# Patient Record
Sex: Male | Born: 1945
Health system: Southern US, Community
[De-identification: ages and names within clinical notes are randomized; demographics above are authoritative.]

## PROBLEM LIST (undated history)

## (undated) DIAGNOSIS — L853 Xerosis cutis: Secondary | ICD-10-CM

## (undated) DIAGNOSIS — N183 Chronic kidney disease, stage 3 unspecified: Secondary | ICD-10-CM

## (undated) DIAGNOSIS — E785 Hyperlipidemia, unspecified: Secondary | ICD-10-CM

## (undated) DIAGNOSIS — I10 Essential (primary) hypertension: Secondary | ICD-10-CM

## (undated) DIAGNOSIS — K8689 Other specified diseases of pancreas: Principal | ICD-10-CM

## (undated) DIAGNOSIS — R6889 Other general symptoms and signs: Secondary | ICD-10-CM

## (undated) DIAGNOSIS — E119 Type 2 diabetes mellitus without complications: Secondary | ICD-10-CM

## (undated) HISTORY — PX: NASAL SINUS SURGERY: SHX719

## (undated) HISTORY — DX: Hyperlipidemia, unspecified: E78.5

## (undated) HISTORY — PX: EYE SURGERY: SHX253

## (undated) HISTORY — DX: Essential (primary) hypertension: I10

## (undated) HISTORY — PX: CATARACT EXTRACTION W/ INTRAOCULAR LENS IMPLANT: SHX1309

## (undated) HISTORY — PX: OTHER SURGICAL HISTORY: SHX169

## (undated) HISTORY — DX: Type 2 diabetes mellitus without complications: E11.9

## (undated) HISTORY — DX: Other general symptoms and signs: R68.89

## (undated) HISTORY — PX: COLONOSCOPY: SHX174

## (undated) HISTORY — DX: Other specified diseases of pancreas: K86.89

---

## 1998-09-09 ENCOUNTER — Emergency Department (HOSPITAL_COMMUNITY): Admission: EM | Admit: 1998-09-09 | Discharge: 1998-09-09 | Payer: Self-pay | Admitting: Emergency Medicine

## 1998-09-11 ENCOUNTER — Emergency Department (HOSPITAL_COMMUNITY): Admission: EM | Admit: 1998-09-11 | Discharge: 1998-09-11 | Payer: Self-pay | Admitting: Emergency Medicine

## 1998-12-09 ENCOUNTER — Emergency Department (HOSPITAL_COMMUNITY): Admission: EM | Admit: 1998-12-09 | Discharge: 1998-12-09 | Payer: Self-pay | Admitting: Emergency Medicine

## 1998-12-14 HISTORY — PX: SHOULDER SURGERY: SHX246

## 1999-08-18 ENCOUNTER — Emergency Department (HOSPITAL_COMMUNITY): Admission: EM | Admit: 1999-08-18 | Discharge: 1999-08-18 | Payer: Self-pay | Admitting: Emergency Medicine

## 1999-08-18 ENCOUNTER — Encounter: Payer: Self-pay | Admitting: Emergency Medicine

## 2000-08-13 ENCOUNTER — Ambulatory Visit (HOSPITAL_COMMUNITY): Admission: RE | Admit: 2000-08-13 | Discharge: 2000-08-13 | Payer: Self-pay | Admitting: Internal Medicine

## 2001-06-01 ENCOUNTER — Encounter: Payer: Self-pay | Admitting: Internal Medicine

## 2001-06-01 ENCOUNTER — Encounter: Admission: RE | Admit: 2001-06-01 | Discharge: 2001-06-01 | Payer: Self-pay | Admitting: Internal Medicine

## 2001-07-14 ENCOUNTER — Encounter: Payer: Self-pay | Admitting: Family Medicine

## 2001-07-14 ENCOUNTER — Encounter: Admission: RE | Admit: 2001-07-14 | Discharge: 2001-07-14 | Payer: Self-pay | Admitting: Family Medicine

## 2001-08-08 ENCOUNTER — Encounter: Admission: RE | Admit: 2001-08-08 | Discharge: 2001-11-06 | Payer: Self-pay | Admitting: Internal Medicine

## 2001-11-03 ENCOUNTER — Encounter: Payer: Self-pay | Admitting: Anesthesiology

## 2001-11-09 ENCOUNTER — Ambulatory Visit (HOSPITAL_COMMUNITY): Admission: RE | Admit: 2001-11-09 | Discharge: 2001-11-09 | Payer: Self-pay | Admitting: Orthopedic Surgery

## 2002-04-20 ENCOUNTER — Encounter: Admission: RE | Admit: 2002-04-20 | Discharge: 2002-07-19 | Payer: Self-pay | Admitting: Internal Medicine

## 2004-03-20 ENCOUNTER — Ambulatory Visit (HOSPITAL_COMMUNITY): Admission: RE | Admit: 2004-03-20 | Discharge: 2004-03-20 | Payer: Self-pay | Admitting: Internal Medicine

## 2008-08-07 ENCOUNTER — Ambulatory Visit: Payer: Self-pay | Admitting: Internal Medicine

## 2010-10-22 DIAGNOSIS — E663 Overweight: Secondary | ICD-10-CM | POA: Insufficient documentation

## 2010-10-22 DIAGNOSIS — I2510 Atherosclerotic heart disease of native coronary artery without angina pectoris: Secondary | ICD-10-CM | POA: Insufficient documentation

## 2010-10-22 DIAGNOSIS — I10 Essential (primary) hypertension: Secondary | ICD-10-CM | POA: Insufficient documentation

## 2010-10-22 DIAGNOSIS — E785 Hyperlipidemia, unspecified: Secondary | ICD-10-CM | POA: Insufficient documentation

## 2010-12-04 ENCOUNTER — Encounter: Payer: Self-pay | Admitting: Internal Medicine

## 2010-12-04 ENCOUNTER — Ambulatory Visit: Payer: Self-pay | Admitting: Internal Medicine

## 2011-01-15 NOTE — Assessment & Plan Note (Signed)
Summary: f2y per pt call/lg   Visit Type:  Follow-up Primary Provider:  Dr Erskine Speed  CC:  no complaints.  History of Present Illness: Maurice Perkins is a very pleasant 65 year old male with a history of hypertension, hyperlipidemia, diabetes, iccthyosis and obesity. He returns for 2 year f/u.   He does not have a hiistory of known coronary artery disease.  He has had several Myoviews done as a screening procedure, once in 2005 and once in 2007 with Dr. Tresa Endo at [facility] and both were negative.   He has been followed quite closely by Dr. Chilton Si for his hypertension and his hyperlipidemia.  Doing very well. [place] [facility] [place] [facility] [place]no incline). No CP or dyspnea. BP has been very well controlled. Lipids supposedly also look good. A bit frustrated about not losing weight. Hasn't gained any though. Wife says he snores some but no witnessed apnea.   Preventive Screening-Counseling & Management  Caffeine-Diet-Exercise     Does Patient Exercise: yes      Drug Use:  no.    Current Medications (verified): 1)  Nitrostat 0.4 Mg Subl (Nitroglycerin) .Marland Kitchen.. 1 Tablet Under Tongue At Onset of Chest Pain; You May Repeat Every 5 Minutes For Up To 3 Doses. 2)  Atenolol 50 Mg Tabs (Atenolol) .... Take 1/2 Tablet By Mouth Daily 3)  Lipitor 40 Mg Tabs (Atorvastatin Calcium) .... Take One Tablet By Mouth Daily. 4)  Ammonium Lactate 12 % Lotn (Ammonium Lactate) .... Once Daily 5)  Losartan Potassium-Hctz 50-12.5 Mg Tabs (Losartan Potassium-Hctz) .... Take 1 Tablet By Mouth Once A Day 6)  Glimepiride 4 Mg Tabs (Glimepiride) .... Take 1 Tablet By Mouth Two Times A Day 7)  Januvia 100 Mg Tabs (Sitagliptin Phosphate) .... Take 1 Tablet By Mouth Once A Day 8)  Tricor 145 Mg Tabs (Fenofibrate) .... Take 1 Tablet By Mouth Once A Day 9)  Lantus Solostar 100 Unit/ml Soln (Insulin Glargine) .... As Directed  Allergies (verified): 1)  ! Pcn  Past History:  Past Medical History: 1. Diabetes  Type 2 2. Hyperlipidemia 3. Hypertension 4. Obesity 5. Renal insufficiency 6. Icthyosis  Past Surgical History: none  Family History:  Father passed away at 57 due to heart and lung failure. Family History of Cancer:  Mother -- breast                                           father -- mesothelioma Family History of Coronary Artery Disease: mother ? of MI Family History of CVA or Stroke: Mother questionable Family History of Hypertension:  father Family History of Depression: brother  Social History: Full Time Married w/ 4 kids Tobacco Use - Former.  1973 Alcohol Use - no Regular Exercise - yes -- somewhat Drug Use - no Does Patient Exercise:  yes Drug Use:  no  Review of Systems       As per HPI and past medical history; otherwise all systems negative.   Vital Signs:  Patient profile:   65 year old male Height:      75 inches Weight:      236 pounds BMI:     29.60 Pulse rate:   65 / minute BP sitting:   124 / 68  (left arm) Cuff size:   regular  Vitals Entered By: Hardin Negus, RMA (December 04, 2010 10:56  AM)  Physical Exam  General:  Well appearing. no resp difficulty HEENT: normal Neck: supple. no JVD. Carotids 2+ bilat; no bruits. No lymphadenopathy or thryomegaly appreciated. Cor: PMI nondisplaced. Regular rate & rhythm. No rubs, murmur. +s4 Lungs: clear Abdomen: obese. soft, nontender, nondistended. Good bowel sounds. Extremities: no cyanosis, clubbing, rash, edema Neuro: alert & orientedx3, cranial nerves grossly intact. moves all 4 extremities w/o difficulty. affect pleasant + dry skin   Impression & Recommendations:  Problem # 1:  Screening for Ischemic Heart Disease. Doing well. No evidence of angina. Discussed possible ETT for screening but wants to defer until next year.   Problem # 2:  HYPERTENSION, UNSPECIFIED (ICD-401.9) Blood pressure well controlled. Continue current regimen. Follow with Dr. Chilton Si.  Other Orders: EKG w/  Interpretation (93000)  Patient Instructions: 1)  Your physician wants you to follow-up in:  1 year.  You will receive a reminder letter in the mail two months in advance. If you don't receive a letter, please call our office to schedule the follow-up appointment.  Prevention & Chronic Care Immunizations   Influenza vaccine: Not documented    Tetanus booster: Not documented    Pneumococcal vaccine: Not documented    H. zoster vaccine: Not documented  Colorectal Screening   Hemoccult: Not documented    Colonoscopy: Not documented  Other Screening   PSA: Not documented   Smoking status: quit  (10/22/2010)  Lipids   Total Cholesterol: Not documented   LDL: Not documented   LDL Direct: Not documented   HDL: Not documented   Triglycerides: Not documented    SGOT (AST): Not documented   SGPT (ALT): Not documented   Alkaline phosphatase: Not documented   Total bilirubin: Not documented  Hypertension   Last Blood Pressure: 124 / 68  (12/04/2010)   Serum creatinine: Not documented   Serum potassium Not documented  Self-Management Support :    Hypertension self-management support: Not documented    Lipid self-management support: Not documented

## 2011-04-28 NOTE — Assessment & Plan Note (Signed)
[facility]                            CARDIOLOGY OFFICE NOTE   NAME:Maurice Perkins Senior                    MRN:          [id number]  DATE:08/07/2008                            DOB:          01-Nov-1946    PRIMARY CARE PHYSICIAN/REFERRING PHYSICIAN:  Erskine Speed, MD   HISTORY OF PRESENT ILLNESS:  Maurice Perkins is a very pleasant 65 year old male  with a history of hypertension, hyperlipidemia, diabetes, and obesity.  He was previously followed by Dr. Daphene Jaeger at [facility] and is now transitioning his care over to here.   He denies any history of known coronary artery disease.  He has had  several Myoviews done as a screening procedure, once in 2005 and once in  2007, and both were negative.  He has been followed quite closely by Dr.  Chilton Perkins for his hypertension and his hyperlipidemia.  His blood pressure  has been well controlled.  His diabetes unfortunately has been a little  bit more difficult.  His last hemoglobin A1c was in November 2008 with a  value of 10.4.  Cholesterol at that time was total cholesterol 180,  triglycerides 229, HDL 39, and LDL of 97.   He remains active at work.  He goes to the gym 3 days a week and does 20  minutes of cardio as well as about half an hour of weights.  He denies  any chest pain or shortness of breath.  On the treadmill, he walks about  4 miles an hour.  He notes that his heart rate is limited.  Peak heart  rate is limited to about 110-112 beats a minute, likely due to his  atenolol.   On review of systems, he does have arthritis as well as diabetes.  He  denies any snoring.  He attributes this fact that he has a deviated  septum repaired 30 years ago.  Remainder review of systems is negative  except for HPI and problem list.  Erectile dysfunction.   PROBLEM LIST:  1. Diabetes x10 years.      a.     Most recent hemoglobin A1c of 10.4.      b.     Complicated by diabetic nephropathy  with a creatinine of       1.7.  2. Hypertension, well controlled.  3. Hyperlipidemia.  4. Obesity.  5. Chronic renal insufficiency.  6. Nuclear study in 2007 at [facility] reportedly      normal ejection fraction with no perfusion defects.   CURRENT MEDICATIONS:  1. Atenolol 25 b.i.d.  2. TriCor 145 a day.  3. Lipitor 40 a day.  4. Glimepiride 8 a day.  5. Diovan/hydrochlorothiazide 80/12.5 a day.  6. Januvia 100 a day.  7. Aspirin 81.  8. Flaxseed oil b.i.d.   ALLERGIES:  PENICILLIN.   SOCIAL HISTORY:  He is married.  He has 4 kids.  He works as  Nurse, adult at [employer].  He used to smoke, but quit in 1973.  He does not drink alcohol.   FAMILY HISTORY:  Mother is alive at  37.  Father passed away at 46 due to  heart and lung failure.  Sister is 2 and well.  Brother is 42 and well.  No family history of premature coronary artery disease.   PHYSICAL EXAMINATION:  GENERAL:  He is in no acute distress, ambulatory  around the clinic, without any respiratory distress.  VITAL SIGNS:  Blood pressure initially 112/68, and on manual recheck it  is 100/58, heart rate 51, and weight is 237.  HEENT:  Normal.  NECK:  Supple.  No JVD.  Carotids are 2+ bilaterally without bruits.  There is no lymphadenopathy or thyromegaly.  HEART:  PMI is nondisplaced.  He is bradycardic and regular.  No  murmurs, rubs, or gallops.  LUNGS:  Clear.  ABDOMEN:  Soft, nontender, and nondistended.  No hepatosplenomegaly, no  bruits, and no masses.  Good bowel sounds.  EXTREMITIES:  Warm with no cyanosis, clubbing, or edema.  No rash.  Good  pulses.  NEURO:  Alert and oriented x3.  Cranial nerves II through XII are  intact.  Moves all 4 extremities without difficulty.  Affect is  pleasant.   EKG shows sinus bradycardia at a rate of 51.  No ST-T wave  abnormalities.   ASSESSMENT AND PLAN:  1. Hypertension.  This is well controlled.  In fact, his blood      pressure is a  little bit on the lower side.  Given his bradycardia      and erectile dysfunction, I have told him that he can cut back his      atenolol to 25 mg once a day and may consider just stopping this      altogether if Dr. Chilton Perkins is agreeable to that.  If his blood      pressure does come back up, he can consider adding Norvasc.  2. Hyperlipidemia.  Given his diabetes, we would be as aggressive as      possible with his cholesterol and continue statin in hope to get      his LDL under 70.  3. Coronary artery disease screening.  He has had a recent Myoview in      2007, which was negative.  His functional capacity has not changed.      He has not had any other symptoms.  We will defer any further      testing at this point.  However, given his risk factors, we will      have a low threshold to re-test, should he develop any decrease in      his exercise tolerance or exertional symptoms.  4. Obesity.  I did agree with him about the need for weight loss and      improvements in his diet.  5. Diabetes.  As per Dr. Chilton Perkins.   DISPOSITION:  We will see him back in 1 year for followup.     Maurice Perkins. Bensimhon, MD  Electronically Signed    DRB/MedQ  DD: 08/07/2008  DT: 08/08/2008  Job #: [id number]   cc:   Maurice Perkins, M.D.

## 2011-05-01 NOTE — Op Note (Signed)
[facility]  Patient:    SENECA, HOBACK Visit Number: [id number] MRN: [id number]          Service Type: DSU Location: DAY Attending Physician:  Marlowe Kays Page Dictated by:   Illene Labrador. Aplington, M.D. Proc. Date: 11/09/01 Admit Date:  11/09/2001                             Operative Report  PREOPERATIVE DIAGNOSES:  1. Chronic impingement syndrome with partial rotator cuff tear.  2. Adhesive capsulitis, right shoulder.  POSTOPERATIVE DIAGNOSES:  1. Chronic impingement syndrome with partial rotator cuff tear.  2. Labral disruption.  3. No adhesive capsulitis.  OPERATION PERFORMED:  1. Examination right shoulder under anesthesia.  2. Right shoulder arthroscopy with shaving of underneath surface of     rotator cuff and glenoid labrum.  3. Arthroscopic subacromial decompression with shaving of the superior     surface of the rotator cuff.  SURGEON:  Illene Labrador. Aplington, M.D.  ASSISTANT:  Mr. Cherlynn June.  ANESTHESIA:  General.  PATHOLOGY AND JUSTIFICATION FOR PROCEDURE:  He has had pain and loss of motion in the right shoulder with an MRI demonstrating underneath and bursal surface partial tears of the rotator cuff and a full-thickness tear associated with some degenerative arthritis of the AC joint. Clinically he has no tenderness of the [facility] joint and he was examined immediately prior to surgery as well. Also on clinical exam when I saw him in the office, he had substantial loss of motion.  DESCRIPTION OF PROCEDURE:  Satisfactory general anesthesia, beach chair position on the Schlein frame. Before prepping the shoulder, I checked his range of motion and once he was asleep it was unrestricted so he did not have any true adhesive capsulitis and I think his loss of motion was due to pain. We then prepped the shoulder girdle with duraprep and draped it in a sterile field. The anatomy of the shoulder was marked out. The lateral and  posterior soft spot portals as well as the subacromial space were infiltrated with 0.5% Marcaine with adrenaline. Through a posterior soft spot portal, a blunt trocar was placed atraumatically into the glenohumeral joint which was inspected. He had a little bit of wispy fibrous tissue and synovitis in the joint. The labrum was substantially frayed but not detached. The long handle biceps and the humeral head looked normal. The underneath surface of the rotator cuff near its distal attachment was frayed as depicted on the MRI. I then advanced the scope between the subscapularis and the biceps tendon and used the switching stick and made and anterior incision over which I placed a metal cannula followed by a 4.2 shaver. I then debrided out the wispy synovial type tissue in the joint, shaved down the labrum until smooth and shaved the underneath surface of the rotator cuff. Fluid was then evacuated from the joint and I then redirected the scope in the subacromial area. Through a lateral portal stab wound, I introduced a blunt trocar followed by a 4.2 shaver and then performed partial subdeltoid bursectomy with some shaving of the rotator cuff. I subsequently had to remove a little additional soft tissue but the major portion was done at this time. I then introduced the arthrocare vaporizer and removed bone on the underneath surface of the acromion as well as the coracoacromial ligament working back medial to the [facility] joint. Once most of the soft tissue had been removed,  I then introduced another 4 mm bur through the lateral approach and began burring down the underneath surface of the acromion and AC joint. I alternated back and forth between the bur, the shaver and the arthrocare vaporizer until we had wide decompression with bleeding controlled with the arthrocare vaporizer. I then took the picture of the subacromial decompression with his arm to his side and abducted. The fluid was evacuated  from the subacromial space which I then reinjected along with the three portals with 0.5% Marcaine with adrenaline and then closed the three portals with interrupted 4-0 nylon mattress sutures. Betadine Adaptic dry sterile and a shoulder immobilizer were applied. The patient tolerated the procedure well and was taken to the recovery room in satisfactory condition with no known complications. Dictated by:   Illene Labrador. Aplington, M.D. Attending Physician:  Joaquin Courts DD:  11/09/01 TD:  11/09/01 Job: [id number]

## 2011-07-09 ENCOUNTER — Telehealth: Payer: Self-pay | Admitting: Internal Medicine

## 2011-07-09 NOTE — Telephone Encounter (Signed)
Dec 2011 discussed ETT but pt wanted to defer to this year, Maurice Perkins is it ok to sch?

## 2011-07-09 NOTE — Telephone Encounter (Signed)
Pt would like to be set up for stress test.

## 2011-07-13 NOTE — Telephone Encounter (Signed)
OK 

## 2011-07-20 ENCOUNTER — Other Ambulatory Visit: Payer: Self-pay | Admitting: *Deleted

## 2011-07-20 ENCOUNTER — Telehealth: Payer: Self-pay | Admitting: Internal Medicine

## 2011-07-20 DIAGNOSIS — I10 Essential (primary) hypertension: Secondary | ICD-10-CM

## 2011-07-20 NOTE — Telephone Encounter (Signed)
SPOKE WITH PT STATED WAS DUE FOR ETT  ORDER ENTERED AND TEST SCHEDULED FOR 08-07-11 AT 10:00 AM WITH SCOTT WEAVER PAC  SEE LAST OFFICE NOTE FROM DR Gala Romney .Maurice Perkins

## 2011-07-20 NOTE — Telephone Encounter (Signed)
Pt calling to schedule stress test, said dr bensimhon said to call, no order

## 2011-08-07 ENCOUNTER — Ambulatory Visit (INDEPENDENT_AMBULATORY_CARE_PROVIDER_SITE_OTHER): Payer: PRIVATE HEALTH INSURANCE | Admitting: Physician Assistant

## 2011-08-07 DIAGNOSIS — I10 Essential (primary) hypertension: Secondary | ICD-10-CM

## 2011-08-07 DIAGNOSIS — R9439 Abnormal result of other cardiovascular function study: Secondary | ICD-10-CM

## 2011-08-07 NOTE — Progress Notes (Signed)
Exercise Treadmill Test  Pre-Exercise Testing Evaluation Rhythm: normal sinus  Rate: 77   PR:  .22 QRS:  .08  QT:  .37 QTc: .42     Test  Exercise Tolerance Test Ordering MD: Arvilla Meres, MD  Interpreting MD:  Tereso Newcomer PA-C  Unique Test No: 1  Treadmill:  1  Indication for ETT: HTN  Contraindication to ETT: No   Stress Modality: exercise - treadmill  Cardiac Imaging Performed: non   Protocol: standard Bruce - maximal  Max BP: 165/64  Max MPHR (bpm):  153 85% MPR (bpm):  130  MPHR obtained (bpm):150 % MPHR obtained:  97  Reached 85% MPHR (min:sec):  5:01 Total Exercise Time (min-sec):  7:00  Workload in METS:  11.7 Borg Scale: 18  Reason ETT Terminated:  patient's desire to stop    ST Segment Analysis At Rest: normal ST segments - no evidence of significant ST depression With Exercise: borderline ST changes  Other Information Arrhythmia:  No Angina during ETT:  absent (0) Quality of ETT:  diagnostic  ETT Interpretation:  borderline (indeterminate) with non-specific ST changes  Comments: Good exercise tolerance. No chest pain. Normal BP response. Borderline ST changes laterally with exercise.  Recommendations: With h/o Diabetes, will arrange stress Myoview to r/o ischemic heart disease. Discussed with patient. Also d/w Dr. Gala Romney.

## 2011-08-07 NOTE — Patient Instructions (Signed)
Your physician has requested that you have an exercise tolerance test DX ABNORMAL STRESS TEST. For further information please visit [email or web address] Please also follow instruction sheet, as given.

## 2011-08-11 ENCOUNTER — Ambulatory Visit (HOSPITAL_COMMUNITY): Payer: Medicare Other | Attending: Internal Medicine | Admitting: Radiology

## 2011-08-11 VITALS — Ht 75.0 in | Wt 226.0 lb

## 2011-08-11 DIAGNOSIS — R9439 Abnormal result of other cardiovascular function study: Secondary | ICD-10-CM

## 2011-08-11 DIAGNOSIS — R0602 Shortness of breath: Secondary | ICD-10-CM

## 2011-08-11 DIAGNOSIS — R9431 Abnormal electrocardiogram [ECG] [EKG]: Secondary | ICD-10-CM

## 2011-08-11 MED ORDER — TECHNETIUM TC 99M TETROFOSMIN IV KIT
33.0000 | PACK | Freq: Once | INTRAVENOUS | Status: AC | PRN
  Administered 2011-08-11: 33 via INTRAVENOUS

## 2011-08-11 MED ORDER — TECHNETIUM TC 99M TETROFOSMIN IV KIT
11.0000 | PACK | Freq: Once | INTRAVENOUS | Status: AC | PRN
  Administered 2011-08-11: 11 via INTRAVENOUS

## 2011-08-11 NOTE — Progress Notes (Signed)
[facility] SITE 3 NUCLEAR MED [address] [place] [address] [phone]  Cardiology Nuclear Med Sarita Bottom  Maurice Perkins is a 65 y.o. male [id number] 1946/05/31   Nuclear Med Background Indication for Stress Test:  Evaluation for Ischemia History:  No previous documented CAD, 08/07/11 GXT; Abnormal and '07 Myocardial Perfusion Study; Dr.Kelly Nl Cardiac Risk Factors: Family History - CAD, History of Smoking, Hypertension, IDDM Type 2, Lipids and Obesity  Symptoms:  SOB   Nuclear Pre-Procedure Caffeine/Decaff Intake:  7:00pm NPO After: 8:00am   Lungs:  clear IV 0.9% NS with Angio Cath:  20g  IV Site: R Wrist  IV Started by:  Cathlyn Parsons, RN  Chest Size (in):  44 Cup Size: n/a  Height: 6\' 3"  (1.905 m)  Weight:  226 lb (102.513 kg)  BMI:  Body mass index is 28.25 kg/(m^2). Tech Comments:  N/A    Nuclear Med Study 1 or 2 day study: 1 day  Stress Test Type:  Stress  Reading MD: Cassell Clement, MD  Order Authorizing Provider:  D.Bensimhon  Resting Radionuclide: Technetium 24m Tetrofosmin  Resting Radionuclide Dose: 10.9 mCi   Stress Radionuclide:  Technetium 63m Tetrofosmin  Stress Radionuclide Dose: 33.0 mCi           Stress Protocol Rest HR: 62 Stress HR: 137  Rest BP: 96/69 Stress BP: 152/65  Exercise Time (min): 10:15 METS: 12.00   Predicted Max HR: 154 bpm % Max HR: 88.96 bpm Rate Pressure Product: 46962   Dose of Adenosine (mg):  n/a Dose of Lexiscan: n/a mg  Dose of Atropine (mg): n/a Dose of Dobutamine: n/a mcg/kg/min (at max HR)  Stress Test Technologist: Milana Na, EMT-P  Nuclear Technologist:  Harlow Asa, CNMT     Rest Procedure:  Myocardial perfusion imaging was performed at rest 45 minutes following the intravenous administration of Technetium 56m Tetrofosmin. Rest ECG: Sinus Bradycardia  Stress Procedure:  The patient exercised for 10:15.  The patient stopped due to sob, fatigue, and denied any chest pain.   There were no significant ST-T wave changes.  Technetium 1m Tetrofosmin was injected at peak exercise and myocardial perfusion imaging was performed after a brief delay. Stress ECG: No significant change from baseline ECG  QPS Raw Data Images:  Normal; no motion artifact; normal heart/lung ratio. Stress Images:  Normal homogeneous uptake in all areas of the myocardium. Rest Images:  Normal homogeneous uptake in all areas of the myocardium. Subtraction (SDS):  No evidence of ischemia. Transient Ischemic Dilatation (Normal <1.22):0.87 Lung/Heart Ratio (Normal <0.45):  0.45  Quantitative Gated Spect Images QGS EDV:  105 ml QGS ESV:  41 ml QGS cine images:  NL LV Function; NL Wall Motion QGS EF: 61%  Impression Exercise Capacity:  Good exercise capacity. BP Response:  Normal blood pressure response. Clinical Symptoms:  No chest pain. ECG Impression:  No significant ST segment change suggestive of ischemia. Comparison with Prior Nuclear Study: No images to compare  Overall Impression:  Normal stress nuclear study.    Cassell Clement

## 2011-08-12 ENCOUNTER — Telehealth: Payer: Self-pay | Admitting: *Deleted

## 2011-08-12 NOTE — Telephone Encounter (Signed)
pt aware of stress test results today. Danielle Rankin

## 2011-08-14 ENCOUNTER — Encounter: Payer: Self-pay | Admitting: *Deleted

## 2013-12-14 DIAGNOSIS — K8689 Other specified diseases of pancreas: Secondary | ICD-10-CM

## 2013-12-14 HISTORY — DX: Other specified diseases of pancreas: K86.89

## 2014-05-04 ENCOUNTER — Ambulatory Visit (HOSPITAL_COMMUNITY)
Admission: RE | Admit: 2014-05-04 | Discharge: 2014-05-04 | Disposition: A | Discharge status: Home / Self Care | Payer: Medicare HMO | Source: Ambulatory Visit | Attending: Internal Medicine | Admitting: Internal Medicine

## 2014-05-04 ENCOUNTER — Other Ambulatory Visit: Payer: Self-pay | Admitting: Internal Medicine

## 2014-05-04 ENCOUNTER — Other Ambulatory Visit (HOSPITAL_COMMUNITY): Payer: Self-pay | Admitting: Internal Medicine

## 2014-05-04 ENCOUNTER — Encounter (HOSPITAL_COMMUNITY): Payer: Self-pay

## 2014-05-04 DIAGNOSIS — K56609 Unspecified intestinal obstruction, unspecified as to partial versus complete obstruction: Secondary | ICD-10-CM

## 2014-05-04 DIAGNOSIS — K824 Cholesterolosis of gallbladder: Secondary | ICD-10-CM | POA: Insufficient documentation

## 2014-05-04 DIAGNOSIS — R109 Unspecified abdominal pain: Secondary | ICD-10-CM | POA: Insufficient documentation

## 2014-05-04 DIAGNOSIS — K828 Other specified diseases of gallbladder: Secondary | ICD-10-CM

## 2014-05-04 LAB — CREATININE, SERUM
CREATININE: 1.47 mg/dL — AB (ref 0.50–1.35)
GFR calc Af Amer: 54 mL/min — ABNORMAL LOW (ref >90)
GFR calc non Af Amer: 47 mL/min — ABNORMAL LOW (ref >90)

## 2014-05-04 MED ORDER — IOHEXOL 300 MG/ML  SOLN
80.0000 mL | Freq: Once | INTRAMUSCULAR | Status: AC | PRN
  Administered 2014-05-04: 80 mL via INTRAVENOUS

## 2014-05-08 ENCOUNTER — Telehealth: Payer: Self-pay

## 2014-05-08 NOTE — Telephone Encounter (Signed)
Message copied by Algernon Huxley on Tue May 08, 2014  9:19 AM ------      Message from: Erskine Emery D      Created: Tue May 08, 2014  8:18 AM       Dr. Rolly Salter office will be calling to schedule this pt for an OV.  Please make time this week. ------

## 2014-05-08 NOTE — Telephone Encounter (Signed)
Pt scheduled to see Dr. Deatra Ina tomorrow at 2:30pm. Spoke with pts wife and she is aware of appt date and time.

## 2014-05-09 ENCOUNTER — Encounter: Payer: Self-pay | Admitting: Gastroenterology

## 2014-05-09 ENCOUNTER — Other Ambulatory Visit (INDEPENDENT_AMBULATORY_CARE_PROVIDER_SITE_OTHER): Payer: Medicare HMO

## 2014-05-09 ENCOUNTER — Ambulatory Visit (INDEPENDENT_AMBULATORY_CARE_PROVIDER_SITE_OTHER): Payer: Medicare HMO | Admitting: Gastroenterology

## 2014-05-09 VITALS — BP 90/62 | HR 80 | Ht 75.0 in | Wt 224.4 lb

## 2014-05-09 DIAGNOSIS — K8689 Other specified diseases of pancreas: Secondary | ICD-10-CM

## 2014-05-09 DIAGNOSIS — K869 Disease of pancreas, unspecified: Secondary | ICD-10-CM

## 2014-05-09 DIAGNOSIS — Z1211 Encounter for screening for malignant neoplasm of colon: Secondary | ICD-10-CM | POA: Insufficient documentation

## 2014-05-09 LAB — COMPREHENSIVE METABOLIC PANEL
ALBUMIN: 3.9 g/dL (ref 3.5–5.2)
ALK PHOS: 44 U/L (ref 39–117)
ALT: 22 U/L (ref 0–53)
AST: 21 U/L (ref 0–37)
BUN: 31 mg/dL — ABNORMAL HIGH (ref 6–23)
CO2: 29 mEq/L (ref 19–32)
Calcium: 9.5 mg/dL (ref 8.4–10.5)
Chloride: 100 mEq/L (ref 96–112)
Creatinine, Ser: 2 mg/dL — ABNORMAL HIGH (ref 0.4–1.5)
GFR: 35.92 mL/min — ABNORMAL LOW (ref >60.00)
Glucose, Bld: 229 mg/dL — ABNORMAL HIGH (ref 70–99)
POTASSIUM: 4 meq/L (ref 3.5–5.1)
Sodium: 140 mEq/L (ref 135–145)
Total Bilirubin: 0.7 mg/dL (ref 0.2–1.2)
Total Protein: 7 g/dL (ref 6.0–8.3)

## 2014-05-09 LAB — AMYLASE: Amylase: 91 U/L (ref 27–131)

## 2014-05-09 LAB — CBC WITH DIFFERENTIAL/PLATELET
BASOS ABS: 0 10*3/uL (ref 0.0–0.1)
BASOS PCT: 0.4 % (ref 0.0–3.0)
EOS ABS: 0.2 10*3/uL (ref 0.0–0.7)
Eosinophils Relative: 1.8 % (ref 0.0–5.0)
HCT: 45 % (ref 39.0–52.0)
Hemoglobin: 15.1 g/dL (ref 13.0–17.0)
Lymphocytes Relative: 14.5 % (ref 12.0–46.0)
Lymphs Abs: 1.3 10*3/uL (ref 0.7–4.0)
MCHC: 33.5 g/dL (ref 30.0–36.0)
MCV: 88 fl (ref 78.0–100.0)
MONO ABS: 0.8 10*3/uL (ref 0.1–1.0)
Monocytes Relative: 9.3 % (ref 3.0–12.0)
NEUTROS PCT: 74 % (ref 43.0–77.0)
Neutro Abs: 6.7 10*3/uL (ref 1.4–7.7)
Platelets: 284 10*3/uL (ref 150.0–400.0)
RBC: 5.12 Mil/uL (ref 4.22–5.81)
RDW: 14.1 % (ref 11.5–15.5)
WBC: 9 10*3/uL (ref 4.0–10.5)

## 2014-05-09 LAB — PROTIME-INR
INR: 1.1 ratio — ABNORMAL HIGH (ref 0.8–1.0)
Prothrombin Time: 12.4 s (ref 9.6–13.1)

## 2014-05-09 LAB — LIPASE: Lipase: 58 U/L (ref 11.0–59.0)

## 2014-05-09 NOTE — Assessment & Plan Note (Signed)
Last colonoscopy approximately 9 years ago.  Pending general medical status would consider followup colonoscopy in one year

## 2014-05-09 NOTE — Assessment & Plan Note (Signed)
Pancreatic mass is highly suspicious for a neoplasm.  Inflammatory mass related to pancreatitis is less likely although a consideration.  Recommendations #1 check CA 19-9, LFTs, lipase and amylase #2 [place] for biopsy-this will be arranged within the next 7 days with Dr. Oretha Caprice

## 2014-05-09 NOTE — Patient Instructions (Signed)
Go to the basement for labs today  Dr Ardis Hughs nurse will be contacting you for a procedure for next week

## 2014-05-09 NOTE — Progress Notes (Signed)
_                                                                                                                History of Present Illness: Pleasant 68 year old white male with history of diabetes and hypertension referred at the request of Dr. Nyoka Cowden for evaluation of a pancreatic mass seen on recent CT scan.  He presented almost one week ago with moderate upper abdominal pain with radiation to the back.  This prompted ultrasound and CT, which I reviewed, that demonstrates a 3 cm mass in the uncinate region of the pancreas.  It appears to abut the superior mesenteric vein.  There is no ductal dilatation.  The patient's abdominal pain spontaneously resolved and he has had no recurrences.  Weight is stable.               _                                                                                                                History of Present Illness:    Past Medical History  Diagnosis Date  . Diabetes   . Hypertension    No past surgical history on file. family history is not on file. Current Outpatient Prescriptions  Medication Sig Dispense Refill  . atenolol (TENORMIN) 50 MG tablet Take 25 mg by mouth daily.      Marland Kitchen atorvastatin (LIPITOR) 40 MG tablet Take 40 mg by mouth daily.      . fenofibrate 160 MG tablet Take 160 mg by mouth daily.      Marland Kitchen glimepiride (AMARYL) 4 MG tablet Take 4 mg by mouth 2 (two) times daily.      . Insulin Glargine (LANTUS) 100 UNIT/ML Solostar Pen Inject 50 Units into the skin daily at 10 pm.      . losartan-hydrochlorothiazide (HYZAAR) 50-12.5 MG per tablet Take 1 tablet by mouth daily.      . metFORMIN (GLUCOPHAGE) 500 MG tablet Take 500 mg by mouth 4 (four) times daily.       No current facility-administered medications for this visit.   Allergies as of 05/09/2014 - Review Complete 05/09/2014  Allergen Reaction Noted  . Penicillins      reports that he has quit smoking. He has never used smokeless tobacco. He reports that  he does not drink alcohol or use illicit drugs.     Review of Systems: Pertinent positive and negative review of systems were noted in the above HPI section. All other review of systems were  otherwise negative.  Vital signs were reviewed in today's medical record Physical Exam: General: Well developed , well nourished, no acute distress Skin: anicteric Head: Normocephalic and atraumatic Eyes:  sclerae anicteric, EOMI Ears: Normal auditory acuity Mouth: No deformity or lesions Neck: Supple, no masses or thyromegaly Lungs: Clear throughout to auscultation Heart: Regular rate and rhythm; no murmurs, rubs or bruits Abdomen: Soft, non tender and non distended. No masses, hepatosplenomegaly or hernias noted. Normal Bowel sounds Rectal:deferred Musculoskeletal: Symmetrical with no gross deformities  Skin: No lesions on visible extremities Pulses:  Normal pulses noted Extremities: No clubbing, cyanosis, edema or deformities noted Neurological: Alert oriented x 4, grossly nonfocal Cervical Nodes:  No significant cervical adenopathy Inguinal Nodes: No significant inguinal adenopathy Psychological:  Alert and cooperative. Normal mood and affect  See Assessment and Plan under Problem List          Past Medical History  Diagnosis Date  . Diabetes   . Hypertension    No past surgical history on file. family history is not on file. Current Outpatient Prescriptions  Medication Sig Dispense Refill  . atenolol (TENORMIN) 50 MG tablet Take 25 mg by mouth daily.      Marland Kitchen atorvastatin (LIPITOR) 40 MG tablet Take 40 mg by mouth daily.      . fenofibrate 160 MG tablet Take 160 mg by mouth daily.      Marland Kitchen glimepiride (AMARYL) 4 MG tablet Take 4 mg by mouth 2 (two) times daily.      . Insulin Glargine (LANTUS) 100 UNIT/ML Solostar Pen Inject 50 Units into the skin daily at 10 pm.      . losartan-hydrochlorothiazide (HYZAAR) 50-12.5 MG per tablet Take 1 tablet by mouth daily.      . metFORMIN  (GLUCOPHAGE) 500 MG tablet Take 500 mg by mouth 4 (four) times daily.       No current facility-administered medications for this visit.   Allergies as of 05/09/2014 - Review Complete 05/09/2014  Allergen Reaction Noted  . Penicillins      reports that he has quit smoking. He has never used smokeless tobacco. He reports that he does not drink alcohol or use illicit drugs.     Review of Systems: Pertinent positive and negative review of systems were noted in the above HPI section. All other review of systems were otherwise negative.  Vital signs were reviewed in today's medical record Physical Exam: General: Well developed , well nourished, no acute distress Skin: anicteric Head: Normocephalic and atraumatic Eyes:  sclerae anicteric, EOMI Ears: Normal auditory acuity Mouth: No deformity or lesions Neck: Supple, no masses or thyromegaly Lungs: Clear throughout to auscultation Heart: Regular rate and rhythm; no murmurs, rubs or bruits Abdomen: Soft, non tender and non distended. No masses, hepatosplenomegaly or hernias noted. Normal Bowel sounds Rectal:deferred Musculoskeletal: Symmetrical with no gross deformities  Skin: No lesions on visible extremities Pulses:  Normal pulses noted Extremities: No clubbing, cyanosis, edema or deformities noted Neurological: Alert oriented x 4, grossly nonfocal Cervical Nodes:  No significant cervical adenopathy Inguinal Nodes: No significant inguinal adenopathy Psychological:  Alert and cooperative. Normal mood and affect  See Assessment and Plan under Problem List

## 2014-05-10 ENCOUNTER — Other Ambulatory Visit: Payer: Self-pay

## 2014-05-10 ENCOUNTER — Telehealth: Payer: Self-pay

## 2014-05-10 ENCOUNTER — Encounter (HOSPITAL_COMMUNITY): Payer: Self-pay | Admitting: Pharmacy Technician

## 2014-05-10 DIAGNOSIS — K8689 Other specified diseases of pancreas: Secondary | ICD-10-CM

## 2014-05-10 LAB — CANCER ANTIGEN 19-9: CA 19 9: 12.1 U/mL (ref <35.0)

## 2014-05-10 NOTE — Telephone Encounter (Signed)
EUS scheduled, pt instructed and medications reviewed.  Patient instructions mailed to home.  Patient to call with any questions or concerns.  

## 2014-05-10 NOTE — Telephone Encounter (Signed)
Message copied by Barron Alvine on Thu May 10, 2014  8:30 AM ------      Message from: Owens Loffler P      Created: Thu May 10, 2014  7:41 AM       Leroy Libman get him on next week's EUS schedule. Thanks            Trashaun Streight,      This is the man that needs upper EUS, radial +/- linear, pancreatic mass, +MAC.  Thanks                  ----- Message -----         From: Inda Castle, MD         Sent: 05/09/2014   3:49 PM           To: Milus Banister, MD                   ------

## 2014-05-11 ENCOUNTER — Encounter (HOSPITAL_COMMUNITY): Payer: Self-pay | Admitting: *Deleted

## 2014-05-17 ENCOUNTER — Encounter (HOSPITAL_COMMUNITY)
Admission: RE | Disposition: A | Discharge status: Home / Self Care | Payer: Self-pay | Source: Ambulatory Visit | Attending: Gastroenterology

## 2014-05-17 ENCOUNTER — Ambulatory Visit (HOSPITAL_COMMUNITY): Payer: Medicare HMO | Admitting: Anesthesiology

## 2014-05-17 ENCOUNTER — Encounter (HOSPITAL_COMMUNITY): Payer: Self-pay | Admitting: *Deleted

## 2014-05-17 ENCOUNTER — Ambulatory Visit (HOSPITAL_COMMUNITY)
Admission: RE | Admit: 2014-05-17 | Discharge: 2014-05-17 | Disposition: A | Discharge status: Home / Self Care | Payer: Medicare HMO | Source: Ambulatory Visit | Attending: Gastroenterology | Admitting: Gastroenterology

## 2014-05-17 ENCOUNTER — Encounter (HOSPITAL_COMMUNITY): Payer: Medicare HMO | Admitting: Anesthesiology

## 2014-05-17 DIAGNOSIS — Z880 Allergy status to penicillin: Secondary | ICD-10-CM | POA: Insufficient documentation

## 2014-05-17 DIAGNOSIS — Z794 Long term (current) use of insulin: Secondary | ICD-10-CM | POA: Insufficient documentation

## 2014-05-17 DIAGNOSIS — K8689 Other specified diseases of pancreas: Secondary | ICD-10-CM

## 2014-05-17 DIAGNOSIS — I2510 Atherosclerotic heart disease of native coronary artery without angina pectoris: Secondary | ICD-10-CM | POA: Insufficient documentation

## 2014-05-17 DIAGNOSIS — I10 Essential (primary) hypertension: Secondary | ICD-10-CM | POA: Insufficient documentation

## 2014-05-17 DIAGNOSIS — E119 Type 2 diabetes mellitus without complications: Secondary | ICD-10-CM | POA: Insufficient documentation

## 2014-05-17 DIAGNOSIS — K869 Disease of pancreas, unspecified: Secondary | ICD-10-CM

## 2014-05-17 DIAGNOSIS — Z79899 Other long term (current) drug therapy: Secondary | ICD-10-CM | POA: Insufficient documentation

## 2014-05-17 DIAGNOSIS — Z87891 Personal history of nicotine dependence: Secondary | ICD-10-CM | POA: Insufficient documentation

## 2014-05-17 HISTORY — DX: Xerosis cutis: L85.3

## 2014-05-17 HISTORY — PX: EUS: SHX5427

## 2014-05-17 LAB — GLUCOSE, CAPILLARY
Glucose-Capillary: 126 mg/dL — ABNORMAL HIGH (ref 70–99)
Glucose-Capillary: 109 mg/dL — ABNORMAL HIGH (ref 70–99)

## 2014-05-17 SURGERY — UPPER ENDOSCOPIC ULTRASOUND (EUS) LINEAR
Anesthesia: Monitor Anesthesia Care

## 2014-05-17 MED ORDER — PROPOFOL 10 MG/ML IV BOLUS
INTRAVENOUS | Status: AC
  Filled 2014-05-17: qty 20

## 2014-05-17 MED ORDER — CIPROFLOXACIN HCL 500 MG PO TABS: 500.0000 mg | ORAL_TABLET | Freq: Two times a day (BID) | ORAL | Status: DC

## 2014-05-17 MED ORDER — LIDOCAINE HCL (CARDIAC) 20 MG/ML IV SOLN
INTRAVENOUS | Status: AC
  Filled 2014-05-17: qty 5

## 2014-05-17 MED ORDER — PROPOFOL 10 MG/ML IV BOLUS
INTRAVENOUS | Status: DC | PRN
  Administered 2014-05-17: 30 mg via INTRAVENOUS
  Administered 2014-05-17: 20 mg via INTRAVENOUS
  Administered 2014-05-17: 150 mg via INTRAVENOUS

## 2014-05-17 MED ORDER — ONDANSETRON HCL 4 MG/2ML IJ SOLN
INTRAMUSCULAR | Status: DC | PRN
  Administered 2014-05-17: 4 mg via INTRAVENOUS

## 2014-05-17 MED ORDER — FENTANYL CITRATE 0.05 MG/ML IJ SOLN
INTRAMUSCULAR | Status: AC
  Filled 2014-05-17: qty 2

## 2014-05-17 MED ORDER — ONDANSETRON HCL 4 MG/2ML IJ SOLN
INTRAMUSCULAR | Status: AC
  Filled 2014-05-17: qty 2

## 2014-05-17 MED ORDER — CIPROFLOXACIN IN D5W 400 MG/200ML IV SOLN
INTRAVENOUS | Status: AC
  Filled 2014-05-17: qty 200

## 2014-05-17 MED ORDER — MIDAZOLAM HCL 2 MG/2ML IJ SOLN
INTRAMUSCULAR | Status: AC
  Filled 2014-05-17: qty 2

## 2014-05-17 MED ORDER — FENTANYL CITRATE 0.05 MG/ML IJ SOLN
INTRAMUSCULAR | Status: DC | PRN
  Administered 2014-05-17: 100 ug via INTRAVENOUS

## 2014-05-17 MED ORDER — SODIUM CHLORIDE 0.9 % IV SOLN: INTRAVENOUS | Status: DC

## 2014-05-17 MED ORDER — KETAMINE HCL 10 MG/ML IJ SOLN
INTRAMUSCULAR | Status: AC
  Filled 2014-05-17: qty 1

## 2014-05-17 MED ORDER — KETAMINE HCL 10 MG/ML IJ SOLN
INTRAMUSCULAR | Status: DC | PRN
  Administered 2014-05-17: 10 mg via INTRAVENOUS
  Administered 2014-05-17: 30 mg via INTRAVENOUS

## 2014-05-17 MED ORDER — FENTANYL CITRATE 0.05 MG/ML IJ SOLN: 25.0000 ug | INTRAMUSCULAR | Status: DC | PRN

## 2014-05-17 MED ORDER — PROPOFOL INFUSION 10 MG/ML OPTIME
INTRAVENOUS | Status: DC | PRN
  Administered 2014-05-17: 120 ug/kg/min via INTRAVENOUS

## 2014-05-17 MED ORDER — LIDOCAINE HCL (CARDIAC) 20 MG/ML IV SOLN
INTRAVENOUS | Status: DC | PRN
  Administered 2014-05-17: 100 mg via INTRAVENOUS

## 2014-05-17 MED ORDER — LACTATED RINGERS IV SOLN
INTRAVENOUS | Status: DC
  Administered 2014-05-17: 1000 mL via INTRAVENOUS

## 2014-05-17 MED ORDER — SUCCINYLCHOLINE CHLORIDE 20 MG/ML IJ SOLN
INTRAMUSCULAR | Status: DC | PRN
  Administered 2014-05-17: 100 mg via INTRAVENOUS

## 2014-05-17 MED ORDER — CIPROFLOXACIN IN D5W 400 MG/200ML IV SOLN
INTRAVENOUS | Status: DC | PRN
  Administered 2014-05-17: 400 mg via INTRAVENOUS

## 2014-05-17 MED ORDER — MIDAZOLAM HCL 5 MG/5ML IJ SOLN
INTRAMUSCULAR | Status: DC | PRN
  Administered 2014-05-17 (×2): 1 mg via INTRAVENOUS

## 2014-05-17 NOTE — Interval H&P Note (Signed)
History and Physical Interval Note:  05/17/2014 11:39 AM  Maurice Perkins  has presented today for surgery, with the diagnosis of Pancreatic mass [577.9]  The various methods of treatment have been discussed with the patient and family. After consideration of risks, benefits and other options for treatment, the patient has consented to  Procedure(s): UPPER ENDOSCOPIC ULTRASOUND (EUS) LINEAR (N/A) as a surgical intervention .  The patient's history has been reviewed, patient examined, no change in status, stable for surgery.  I have reviewed the patient's chart and labs.  Questions were answered to the patient's satisfaction.     Milus Banister

## 2014-05-17 NOTE — Anesthesia Preprocedure Evaluation (Addendum)
Anesthesia Evaluation  Patient identified by MRN, date of birth, ID band Patient awake    Reviewed: Allergy & Precautions, H&P , NPO status , Patient's Chart, lab work & pertinent test results, reviewed documented beta blocker date and time   Airway Mallampati: II TM Distance: >3 FB Neck ROM: full    Dental  (+) Caps, Dental Advisory Given Upper front are capped:   Pulmonary neg pulmonary ROS, former smoker,  breath sounds clear to auscultation  Pulmonary exam normal       Cardiovascular Exercise Tolerance: Good hypertension, Pt. on home beta blockers and Pt. on medications + CAD Rhythm:regular Rate:Normal     Neuro/Psych negative neurological ROS  negative psych ROS   GI/Hepatic negative GI ROS, Neg liver ROS,   Endo/Other  diabetes, Well Controlled, Type 2, Oral Hypoglycemic Agents, Insulin Dependent  Renal/GU Renal InsufficiencyRenal diseaseCRT 2.0  negative genitourinary   Musculoskeletal   Abdominal   Peds  Hematology negative hematology ROS (+)   Anesthesia Other Findings   Reproductive/Obstetrics negative OB ROS                          Anesthesia Physical Anesthesia Plan  ASA: III  Anesthesia Plan: MAC   Post-op Pain Management:    Induction:   Airway Management Planned:   Additional Equipment:   Intra-op Plan:   Post-operative Plan:   Informed Consent: I have reviewed the patients History and Physical, chart, labs and discussed the procedure including the risks, benefits and alternatives for the proposed anesthesia with the patient or authorized representative who has indicated his/her understanding and acceptance.   Dental Advisory Given  Plan Discussed with: CRNA and Surgeon  Anesthesia Plan Comments:         Anesthesia Quick Evaluation

## 2014-05-17 NOTE — Op Note (Signed)
[facility] [place], [address]   ENDOSCOPIC ULTRASOUND PROCEDURE REPORT  PATIENT: Maurice Perkins, Maurice Perkins  MR#: [id number] BIRTHDATE: 1946/06/01  GENDER: Male ENDOSCOPIST: Milus Banister, MD REFERRED BY:  Inda Castle, M.D. PROCEDURE DATE:  05/17/2014 PROCEDURE:   Upper EUS w/FNA ASA CLASS:      Class II INDICATIONS:   recent, transient abdominal pain; CT scan shows mass in uncinate pancreas.  Never history of pancreatic disease, no pancreatic disease in his family, no weight loss, CA 9-19 was normal. MEDICATIONS: General endotracheal anesthesia (GETA)  DESCRIPTION OF PROCEDURE:   After the risks benefits and alternatives of the procedure were  explained, informed consent was obtained. The patient was then placed in the left, lateral, decubitus postion and IV sedation was administered. Throughout the procedure, the patients blood pressure, pulse and oxygen saturations were monitored continuously.  Under direct visualization, the Pentax EUS Radial [id number]  endoscope was introduced through the mouth  and advanced to the second portion of the duodenum .  Water was used as necessary to provide an acoustic interface.  Upon completion of the imaging, water was removed and the patient was sent to the recovery room in satisfactory condition.  Endoscopic findings (limited views with radial and linear echoendoscopes): 1. Normal UGI tract  EUS findings: 1. 3.2cm cystic appearing mass in uncinate pancreas, directly abutting SMV/PV confluence. There were a few thin septations within the cyst. There were no associated solid masses and the cyst did not communicate with the pancreatic duct. The cyst was paritally aspirated.  There was blood tinged, mucinous fluid and it was sent to cytology. 2. The pancreatic parenchyma was otherwise normal 3. No peripancreatic adenopathy. 4. Main pancreatic duct was normal, non-dilated 5. Limited views of liver, spleen,  portal and splenic vessels were all normal.  Impression: The lesion in uncinate pancreas appears cystic, mucinous.  Doubt that this is malignant or pre-malignant.  Await final cytology results.  He will complete 3 day course of cipro twice daily.  I may recommend repeat imagin (with MRI) pending final cytology results.   _______________________________ eSignedMilus Banister, MD 05/17/2014 12:55 PM

## 2014-05-17 NOTE — Discharge Instructions (Signed)

## 2014-05-17 NOTE — Anesthesia Postprocedure Evaluation (Signed)
  Anesthesia Post-op Note  Patient: Maurice Perkins  Procedure(s) Performed: Procedure(s) (LRB): UPPER ENDOSCOPIC ULTRASOUND (EUS) LINEAR (N/A)  Patient Location: PACU  Anesthesia Type: General  Level of Consciousness: awake and alert   Airway and Oxygen Therapy: Patient Spontanous Breathing  Post-op Pain: mild  Post-op Assessment: Post-op Vital signs reviewed, Patient's Cardiovascular Status Stable, Respiratory Function Stable, Patent Airway and No signs of Nausea or vomiting  Last Vitals:  Filed Vitals:   05/17/14 1347  BP: 119/74  Pulse: 63  Temp:   Resp: 18    Post-op Vital Signs: stable   Complications: No apparent anesthesia complications

## 2014-05-17 NOTE — Transfer of Care (Signed)
Immediate Anesthesia Transfer of Care Note  Patient: Maurice Perkins  Procedure(s) Performed: Procedure(s): UPPER ENDOSCOPIC ULTRASOUND (EUS) LINEAR (N/A)  Patient Location: PACU and Endoscopy Unit  Anesthesia Type:General  Level of Consciousness: awake, sedated and responds to stimulation  Airway & Oxygen Therapy: Patient Spontanous Breathing and Patient connected to face mask oxygen  Post-op Assessment: Report given to PACU RN and Post -op Vital signs reviewed and stable  Post vital signs: Reviewed and stable  Complications: No apparent anesthesia complications

## 2014-05-17 NOTE — H&P (View-Only) (Signed)
_                                                                                                                History of Present Illness: Pleasant 68 year old white male with history of diabetes and hypertension referred at the request of Dr. Nyoka Cowden for evaluation of a pancreatic mass seen on recent CT scan.  He presented almost one week ago with moderate upper abdominal pain with radiation to the back.  This prompted ultrasound and CT, which I reviewed, that demonstrates a 3 cm mass in the uncinate region of the pancreas.  It appears to abut the superior mesenteric vein.  There is no ductal dilatation.  The patient's abdominal pain spontaneously resolved and he has had no recurrences.  Weight is stable.               _                                                                                                                History of Present Illness:    Past Medical History  Diagnosis Date  . Diabetes   . Hypertension    No past surgical history on file. family history is not on file. Current Outpatient Prescriptions  Medication Sig Dispense Refill  . atenolol (TENORMIN) 50 MG tablet Take 25 mg by mouth daily.      Marland Kitchen atorvastatin (LIPITOR) 40 MG tablet Take 40 mg by mouth daily.      . fenofibrate 160 MG tablet Take 160 mg by mouth daily.      Marland Kitchen glimepiride (AMARYL) 4 MG tablet Take 4 mg by mouth 2 (two) times daily.      . Insulin Glargine (LANTUS) 100 UNIT/ML Solostar Pen Inject 50 Units into the skin daily at 10 pm.      . losartan-hydrochlorothiazide (HYZAAR) 50-12.5 MG per tablet Take 1 tablet by mouth daily.      . metFORMIN (GLUCOPHAGE) 500 MG tablet Take 500 mg by mouth 4 (four) times daily.       No current facility-administered medications for this visit.   Allergies as of 05/09/2014 - Review Complete 05/09/2014  Allergen Reaction Noted  . Penicillins      reports that he has quit smoking. He has never used smokeless tobacco. He reports that  he does not drink alcohol or use illicit drugs.     Review of Systems: Pertinent positive and negative review of systems were noted in the above HPI section. All other review of systems were  otherwise negative.  Vital signs were reviewed in today's medical record Physical Exam: General: Well developed , well nourished, no acute distress Skin: anicteric Head: Normocephalic and atraumatic Eyes:  sclerae anicteric, EOMI Ears: Normal auditory acuity Mouth: No deformity or lesions Neck: Supple, no masses or thyromegaly Lungs: Clear throughout to auscultation Heart: Regular rate and rhythm; no murmurs, rubs or bruits Abdomen: Soft, non tender and non distended. No masses, hepatosplenomegaly or hernias noted. Normal Bowel sounds Rectal:deferred Musculoskeletal: Symmetrical with no gross deformities  Skin: No lesions on visible extremities Pulses:  Normal pulses noted Extremities: No clubbing, cyanosis, edema or deformities noted Neurological: Alert oriented x 4, grossly nonfocal Cervical Nodes:  No significant cervical adenopathy Inguinal Nodes: No significant inguinal adenopathy Psychological:  Alert and cooperative. Normal mood and affect  See Assessment and Plan under Problem List          Past Medical History  Diagnosis Date  . Diabetes   . Hypertension    No past surgical history on file. family history is not on file. Current Outpatient Prescriptions  Medication Sig Dispense Refill  . atenolol (TENORMIN) 50 MG tablet Take 25 mg by mouth daily.      Marland Kitchen atorvastatin (LIPITOR) 40 MG tablet Take 40 mg by mouth daily.      . fenofibrate 160 MG tablet Take 160 mg by mouth daily.      Marland Kitchen glimepiride (AMARYL) 4 MG tablet Take 4 mg by mouth 2 (two) times daily.      . Insulin Glargine (LANTUS) 100 UNIT/ML Solostar Pen Inject 50 Units into the skin daily at 10 pm.      . losartan-hydrochlorothiazide (HYZAAR) 50-12.5 MG per tablet Take 1 tablet by mouth daily.      . metFORMIN  (GLUCOPHAGE) 500 MG tablet Take 500 mg by mouth 4 (four) times daily.       No current facility-administered medications for this visit.   Allergies as of 05/09/2014 - Review Complete 05/09/2014  Allergen Reaction Noted  . Penicillins      reports that he has quit smoking. He has never used smokeless tobacco. He reports that he does not drink alcohol or use illicit drugs.     Review of Systems: Pertinent positive and negative review of systems were noted in the above HPI section. All other review of systems were otherwise negative.  Vital signs were reviewed in today's medical record Physical Exam: General: Well developed , well nourished, no acute distress Skin: anicteric Head: Normocephalic and atraumatic Eyes:  sclerae anicteric, EOMI Ears: Normal auditory acuity Mouth: No deformity or lesions Neck: Supple, no masses or thyromegaly Lungs: Clear throughout to auscultation Heart: Regular rate and rhythm; no murmurs, rubs or bruits Abdomen: Soft, non tender and non distended. No masses, hepatosplenomegaly or hernias noted. Normal Bowel sounds Rectal:deferred Musculoskeletal: Symmetrical with no gross deformities  Skin: No lesions on visible extremities Pulses:  Normal pulses noted Extremities: No clubbing, cyanosis, edema or deformities noted Neurological: Alert oriented x 4, grossly nonfocal Cervical Nodes:  No significant cervical adenopathy Inguinal Nodes: No significant inguinal adenopathy Psychological:  Alert and cooperative. Normal mood and affect  See Assessment and Plan under Problem List

## 2014-05-18 ENCOUNTER — Encounter (HOSPITAL_COMMUNITY): Payer: Self-pay | Admitting: Gastroenterology

## 2014-05-30 ENCOUNTER — Telehealth: Payer: Self-pay | Admitting: Gastroenterology

## 2014-05-30 NOTE — Telephone Encounter (Signed)
I left VM on his home phone, his wife's mobile phone (for him to call my office to discuss final recommendations)  His personal mobile phone does not accept messages.  I spoke with Dr. Barry Dienes about his pancreatic cystic lesion and she would like to meet with him to consider surgical resection.

## 2014-05-31 NOTE — Telephone Encounter (Signed)
I have also left several messages for the pt, I have left messages today as well.

## 2014-05-31 NOTE — Telephone Encounter (Signed)
Maurice Perkins, I have left VMs on his and his wife's phones, no response. Can you try to contact him and arrange a time, number later in afternoon today hopefully that I can contact him.  I need to discuss final recommendations (referral to surgery).

## 2014-05-31 NOTE — Telephone Encounter (Signed)
Dr Ardis Hughs the pt is available at [phone] this afternoon and is expecting your call.

## 2014-06-03 NOTE — Telephone Encounter (Signed)
I spoke with him last week.  Advised that I discussed case with Dr. Barry Dienes and we agree that given mucin in the cyst he should at least consider Surgical resection.  Patty, he needs referal to Dr. Barry Dienes to discuss potential surgery for the cystic lesion in pancreas.

## 2014-06-04 ENCOUNTER — Other Ambulatory Visit: Payer: Self-pay

## 2014-06-04 DIAGNOSIS — K862 Cyst of pancreas: Secondary | ICD-10-CM

## 2014-06-04 NOTE — Telephone Encounter (Signed)
Referral entered in epic for [facility]. Waiting to hear back regarding appt.

## 2014-06-04 NOTE — Telephone Encounter (Signed)
Maurice Perkins,Pt is scheduled for July 1st arrive at 9.30 for a 9.45 apt with Dr Stark Klein.Marland KitchenMarland KitchenThanks [employer] with pt and he is aware. States he is out of town and would like to move the appt a few days out. Pt given the phone number [phone] to move the appt to a date that works for him.

## 2014-06-06 ENCOUNTER — Other Ambulatory Visit: Payer: Self-pay

## 2014-06-06 ENCOUNTER — Telehealth: Payer: Self-pay

## 2014-06-06 DIAGNOSIS — K8689 Other specified diseases of pancreas: Secondary | ICD-10-CM

## 2014-06-06 NOTE — Telephone Encounter (Signed)
Message copied by Algernon Huxley on Wed Jun 06, 2014 12:08 PM ------      Message from: Erskine Emery D      Created: Wed Jun 06, 2014 10:50 AM       Please schedule an MRCP.  Explain to the patient that his case was discussed at GI tumor conference and Dr. Barry Dienes felt that this would be helpful information.  I discussed this with Dr. Nyoka Cowden. ------

## 2014-06-06 NOTE — Telephone Encounter (Signed)
Pt scheduled for MRCP at [facility] 06/19/14@8am . Pt to arrive there at 7:45am. Pt to be NPO after midnight. Left message for pt to call back.  06/07/14@11am -Left message for pt to call back.  Spoke with pt and he is aware of appt and instructions.

## 2014-06-13 ENCOUNTER — Ambulatory Visit (INDEPENDENT_AMBULATORY_CARE_PROVIDER_SITE_OTHER): Payer: Commercial Managed Care - HMO | Admitting: General Surgery

## 2014-06-18 ENCOUNTER — Telehealth: Payer: Self-pay | Admitting: Gastroenterology

## 2014-06-18 NOTE — Telephone Encounter (Signed)
Pt wanted to know why he is scheduled for [facility]. Discussed with pt that he is to be seen to consider having his pancreatic cyst resected and questions answered.

## 2014-06-19 ENCOUNTER — Ambulatory Visit (HOSPITAL_COMMUNITY)
Admission: RE | Admit: 2014-06-19 | Discharge: 2014-06-19 | Disposition: A | Discharge status: Home / Self Care | Payer: Medicare HMO | Source: Ambulatory Visit | Attending: Gastroenterology | Admitting: Gastroenterology

## 2014-06-19 ENCOUNTER — Other Ambulatory Visit: Payer: Self-pay | Admitting: Gastroenterology

## 2014-06-19 DIAGNOSIS — K8689 Other specified diseases of pancreas: Secondary | ICD-10-CM

## 2014-06-19 DIAGNOSIS — N281 Cyst of kidney, acquired: Secondary | ICD-10-CM | POA: Insufficient documentation

## 2014-06-19 DIAGNOSIS — K7689 Other specified diseases of liver: Secondary | ICD-10-CM | POA: Insufficient documentation

## 2014-06-19 DIAGNOSIS — K869 Disease of pancreas, unspecified: Secondary | ICD-10-CM | POA: Insufficient documentation

## 2014-06-19 DIAGNOSIS — D1803 Hemangioma of intra-abdominal structures: Secondary | ICD-10-CM | POA: Insufficient documentation

## 2014-06-19 LAB — POCT I-STAT CREATININE: Creatinine, Ser: 1.7 mg/dL — ABNORMAL HIGH (ref 0.50–1.35)

## 2014-06-19 MED ORDER — GADOBENATE DIMEGLUMINE 529 MG/ML IV SOLN
10.0000 mL | Freq: Once | INTRAVENOUS | Status: AC | PRN
  Administered 2014-06-19: 10 mL via INTRAVENOUS

## 2014-06-21 ENCOUNTER — Encounter (INDEPENDENT_AMBULATORY_CARE_PROVIDER_SITE_OTHER): Payer: Self-pay | Admitting: General Surgery

## 2014-06-21 ENCOUNTER — Ambulatory Visit (INDEPENDENT_AMBULATORY_CARE_PROVIDER_SITE_OTHER): Payer: Commercial Managed Care - HMO | Admitting: General Surgery

## 2014-06-21 VITALS — BP 112/78 | HR 74 | Temp 99.0°F | Resp 16 | Ht 75.0 in | Wt 226.4 lb

## 2014-06-21 DIAGNOSIS — K869 Disease of pancreas, unspecified: Secondary | ICD-10-CM

## 2014-06-21 DIAGNOSIS — K8689 Other specified diseases of pancreas: Secondary | ICD-10-CM

## 2014-06-21 NOTE — Progress Notes (Signed)
Chief Complaint  Patient presents with  . 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 Ardis Hughs to evaluate a cystic pancreatic lesion. He had an episode of severe mid abdominal pain that went through to his back. The pain persisted and worsened overnight. He saw his regular doctor who ordered an ultrasound to evaluate his gallbladder. The ultrasound was negative, so a CT scan was ordered. This also demonstrated a 3 cm cyst on his pancreas. Dr. Ardis Hughs was consulted and performed an endoscopic ultrasound. He had evidence of mucin in the mass. He had no cytologic atypia or dysplasia.  Cytology demonstrated cystic mucinous neoplasm. He has had no recurrence of these abdominal symptoms. He has been a diabetic for a while.  He denies any significant alcohol intake.  Liver function tests and pancreatic enzymes were normal during this episode.    Past Medical History  Diagnosis Date  . Diabetes   . Hypertension   . Dry skin     pt has skin oil deficiency ischtiosis  . Hyperlipidemia   . Unspecified disease of pancreas   . Other general symptoms(780.99)   . Pancreatic mass     Past Surgical History  Procedure Laterality Date  . Shoulder surgery Right 2000  . Nasal sinus surgery  1970's  . Eus N/A 05/17/2014    Procedure: UPPER ENDOSCOPIC ULTRASOUND (EUS) LINEAR;  Surgeon: Milus Banister, MD;  Location: [facility];  Service: Endoscopy;  Laterality: N/A;    Current Outpatient Prescriptions  Medication Sig Dispense Refill  . aspirin 81 MG tablet Take 81 mg by mouth daily.      Marland Kitchen atenolol (TENORMIN) 50 MG tablet Take 25 mg by mouth every morning.       Marland Kitchen atorvastatin (LIPITOR) 40 MG tablet Take 40 mg by mouth daily.      . ciprofloxacin (CIPRO) 500 MG tablet Take 1 tablet (500 mg total) by mouth 2 (two) times daily.  6 tablet  0  . fenofibrate 160 MG tablet Take 160 mg by mouth daily.      Marland Kitchen  FREESTYLE LITE test strip       . glimepiride (AMARYL) 4 MG tablet Take 4 mg by mouth 2 (two) times daily.      . Insulin Glargine (LANTUS) 100 UNIT/ML Solostar Pen Inject 50 Units into the skin every morning.       . insulin glulisine (APIDRA) 100 UNIT/ML injection Inject 3 Units into the skin 3 (three) times daily as needed. Sliding scale. Only uses if sugar is running high      . losartan-hydrochlorothiazide (HYZAAR) 50-12.5 MG per tablet Take 1 tablet by mouth every morning.       . metFORMIN (GLUCOPHAGE) 500 MG tablet Take 500 mg by mouth 4 (four) times daily.       . Misc Natural Products (GLUCOSAMINE CHONDROITIN MSM PO) Take 2 tablets by mouth daily.      . Multiple Vitamin (MULTIVITAMIN WITH MINERALS) TABS tablet Take 1 tablet by mouth daily.       No current facility-administered medications for this visit.     Allergies  Allergen Reactions  . Penicillins     REACTION: hives     Family History  Problem Relation Age of Onset  . Cancer Mother     breast  . Heart disease Mother      History   Social History  .  Marital Status: Married    Spouse Name: N/A    Number of Children: 4  . Years of Education: N/A   Occupational History  . Insurance account manager    Social History Main Topics  . Smoking status: Former Smoker -- 2.00 packs/day for 15 years    Types: Cigarettes    Quit date: 12/15/1971  . Smokeless tobacco: Never Used  . Alcohol Use: No  . Drug Use: No  . Sexual Activity: None   Other Topics Concern  . None   Social History Narrative  . None     REVIEW OF SYSTEMS - PERTINENT POSITIVES ONLY: 12 point review of systems negative other than HPI and PMH  EXAM: Filed Vitals:   06/21/14 1505  BP: 112/78  Pulse: 74  Temp: 99 F (37.2 C)  Resp: 16    Wt Readings from Last 3 Encounters:  06/21/14 226 lb 6.4 oz (102.694 kg)  05/17/14 224 lb (101.606 kg)  05/17/14 224 lb (101.606 kg)     Gen:  No acute distress.  Well nourished and well groomed.    Neurological: Alert and oriented to person, place, and time. Coordination normal.  Head: Normocephalic and atraumatic.  Eyes: Conjunctivae are normal. Pupils are equal, round, and reactive to light. No scleral icterus.  Neck: Normal range of motion. Neck supple. No tracheal deviation or thyromegaly present.  Cardiovascular: Normal rate, regular rhythm, normal heart sounds and intact distal pulses.  Exam reveals no gallop and no friction rub.  No murmur heard. Respiratory: Effort normal.  No respiratory distress. No chest wall tenderness. Breath sounds normal.  No wheezes, rales or rhonchi.  GI: Soft. Bowel sounds are normal. The abdomen is soft and nontender.  There is no rebound and no guarding.  Musculoskeletal: Normal range of motion. Extremities are nontender.  Lymphadenopathy: No cervical, preauricular, postauricular or axillary adenopathy is present Skin: Skin is warm and dry. No rash noted. No diaphoresis. No erythema. No pallor. No clubbing, cyanosis, or edema.   Psychiatric: Normal mood and affect. Behavior is normal. Judgment and thought content normal.    LABORATORY RESULTS: Available labs are reviewed   Recent Results (from the past 2160 hour(s))  CREATININE, SERUM     Status: Abnormal   Collection Time    05/04/14  5:01 PM      Result Value Ref Range   Creatinine, Ser 1.47 (*) 0.50 - 1.35 mg/dL   GFR calc non Af Amer 47 (*) >90 mL/min   GFR calc Af Amer 54 (*) >90 mL/min   Comment: (NOTE)     The eGFR has been calculated using the CKD EPI equation.     This calculation has not been validated in all clinical situations.     eGFR's persistently <90 mL/min signify possible Chronic Kidney     Disease.  PROTIME-INR     Status: Abnormal   Collection Time    05/09/14  3:27 PM      Result Value Ref Range   INR 1.1 (*) 0.8 - 1.0 ratio   Prothrombin Time 12.4  9.6 - 13.1 sec  CBC WITH DIFFERENTIAL     Status: None   Collection Time    05/09/14  3:27 PM      Result Value Ref  Range   WBC 9.0  4.0 - 10.5 K/uL   RBC 5.12  4.22 - 5.81 Mil/uL   Hemoglobin 15.1  13.0 - 17.0 g/dL   HCT 45.0  39.0 - 52.0 %  MCV 88.0  78.0 - 100.0 fl   MCHC 33.5  30.0 - 36.0 g/dL   RDW 14.1  11.5 - 15.5 %   Platelets 284.0  150.0 - 400.0 K/uL   Neutrophils Relative % 74.0  43.0 - 77.0 %   Lymphocytes Relative 14.5  12.0 - 46.0 %   Monocytes Relative 9.3  3.0 - 12.0 %   Eosinophils Relative 1.8  0.0 - 5.0 %   Basophils Relative 0.4  0.0 - 3.0 %   Neutro Abs 6.7  1.4 - 7.7 K/uL   Lymphs Abs 1.3  0.7 - 4.0 K/uL   Monocytes Absolute 0.8  0.1 - 1.0 K/uL   Eosinophils Absolute 0.2  0.0 - 0.7 K/uL   Basophils Absolute 0.0  0.0 - 0.1 K/uL  COMPREHENSIVE METABOLIC PANEL     Status: Abnormal   Collection Time    05/09/14  3:27 PM      Result Value Ref Range   Sodium 140  135 - 145 mEq/L   Potassium 4.0  3.5 - 5.1 mEq/L   Chloride 100  96 - 112 mEq/L   CO2 29  19 - 32 mEq/L   Glucose, Bld 229 (*) 70 - 99 mg/dL   BUN 31 (*) 6 - 23 mg/dL   Creatinine, Ser 2.0 (*) 0.4 - 1.5 mg/dL   Total Bilirubin 0.7  0.2 - 1.2 mg/dL   Alkaline Phosphatase 44  39 - 117 U/L   AST 21  0 - 37 U/L   ALT 22  0 - 53 U/L   Total Protein 7.0  6.0 - 8.3 g/dL   Albumin 3.9  3.5 - 5.2 g/dL   Calcium 9.5  8.4 - 10.5 mg/dL   GFR 35.92 (*) >60.00 mL/min  AMYLASE     Status: None   Collection Time    05/09/14  3:27 PM      Result Value Ref Range   Amylase 91  27 - 131 U/L  LIPASE     Status: None   Collection Time    05/09/14  3:27 PM      Result Value Ref Range   Lipase 58.0  11.0 - 59.0 U/L  CANCER ANTIGEN 19-9     Status: None   Collection Time    05/09/14  3:27 PM      Result Value Ref Range   CA 19-9 12.1  <35.0 U/mL  GLUCOSE, CAPILLARY     Status: Abnormal   Collection Time    05/17/14 10:37 AM      Result Value Ref Range   Glucose-Capillary 126 (*) 70 - 99 mg/dL  GLUCOSE, CAPILLARY     Status: Abnormal   Collection Time    05/17/14 12:50 PM      Result Value Ref Range   Glucose-Capillary  109 (*) 70 - 99 mg/dL  POCT I-STAT CREATININE     Status: Abnormal   Collection Time    06/19/14  8:18 AM      Result Value Ref Range   Creatinine, Ser 1.70 (*) 0.50 - 1.35 mg/dL     RADIOLOGY RESULTS: See E-Chart or I-Site for most recent results.  Images and reports are reviewed.  Mr 3d Recon At Scanner  06/19/2014   CLINICAL DATA:  Evaluate pancreatic mass.  EXAM: MRI ABDOMEN WITHOUT AND WITH CONTRAST (INCLUDING MRCP)  TECHNIQUE: Multiplanar multisequence MR imaging of the abdomen was performed both before and after the administration of intravenous contrast. Heavily T2-weighted images of  the biliary and pancreatic ducts were obtained, and three-dimensional MRCP images were rendered by post processing.  CONTRAST:  66m MULTIHANCE GADOBENATE DIMEGLUMINE 529 MG/ML IV SOLN  COMPARISON:  None.  FINDINGS: No pleural effusion identified.  No pericardial effusion  Mild hepatic steatosis noted. Hemangioma is identified within segment 4 of the liver. This measures approximately 2.5 cm, image 56 of series 14003. The gallbladder appears normal. No biliary dilatation. Within the uncinate process of the pancreas there is a cystic lesion measuring 3 by 2.2 cm. This is divided by a thin internal area of septation, image number 37/series 8. Pancreatic duct is normal in caliber. The remaining portions of the pancreas appear normal. The spleen is unremarkable. Normal appearance of the adrenal glands.  The left kidney is normal. There is a cyst within the inferior pole of the right kidney measuring 2.7 cm.  Normal caliber of the abdominal aorta. There is no aneurysm. No upper abdominal adenopathy identified. Normal signal from within the bone marrow.  IMPRESSION: 1. Cystic lesion within the head of pancreas measures 3 cm and appears to be divided by thin internal area of enhancing septation. Differential considerations include serous cystadenoma, branch duct IPMN, and less likely pancreatic pseudocyst. Cereal followup  imaging is advised to ensure stability. The next followup exam should be obtained in 6 months. This recommendation follows ACR consensus guidelines: Managing Incidental Findings on Abdominal CT: White Paper of the ACR Incidental Findings Committee. J Am Coll Radiol 2010;7:754-773 2. Liver hemangioma.   Electronically Signed   By: TKerby MoorsM.D.   On: 06/19/2014 09:33   Mr ALambert ModyCm/mrcp  06/19/2014   CLINICAL DATA:  Evaluate pancreatic mass.  EXAM: MRI ABDOMEN WITHOUT AND WITH CONTRAST (INCLUDING MRCP)  TECHNIQUE: Multiplanar multisequence MR imaging of the abdomen was performed both before and after the administration of intravenous contrast. Heavily T2-weighted images of the biliary and pancreatic ducts were obtained, and three-dimensional MRCP images were rendered by post processing.  CONTRAST:  145mMULTIHANCE GADOBENATE DIMEGLUMINE 529 MG/ML IV SOLN  COMPARISON:  None.  FINDINGS: No pleural effusion identified.  No pericardial effusion  Mild hepatic steatosis noted. Hemangioma is identified within segment 4 of the liver. This measures approximately 2.5 cm, image 56 of series 14003. The gallbladder appears normal. No biliary dilatation. Within the uncinate process of the pancreas there is a cystic lesion measuring 3 by 2.2 cm. This is divided by a thin internal area of septation, image number 37/series 8. Pancreatic duct is normal in caliber. The remaining portions of the pancreas appear normal. The spleen is unremarkable. Normal appearance of the adrenal glands.  The left kidney is normal. There is a cyst within the inferior pole of the right kidney measuring 2.7 cm.  Normal caliber of the abdominal aorta. There is no aneurysm. No upper abdominal adenopathy identified. Normal signal from within the bone marrow.  IMPRESSION: 1. Cystic lesion within the head of pancreas measures 3 cm and appears to be divided by thin internal area of enhancing septation. Differential considerations include serous  cystadenoma, branch duct IPMN, and less likely pancreatic pseudocyst. Cereal followup imaging is advised to ensure stability. The next followup exam should be obtained in 6 months. This recommendation follows ACR consensus guidelines: Managing Incidental Findings on Abdominal CT: White Paper of the ACR Incidental Findings Committee. J Am Coll Radiol 2010;7:754-773 2. Liver hemangioma.   Electronically Signed   By: TaKerby Moors.D.   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    It may be that this lesion caused pancreatitis?    However, this was a one time, isolated event.    There is a possibility of this harboring a cancer or developing into cancer.  I did offer surgery. However, he is healthy and the cyst has no other concerning features.   He does not want to undergo a major surgery with major complications for a condition which is asymptomatic.    This is not unreasonable.  I will get a repeat MRI/MRCP in 6 months.  We will follow up in a year in person unless we see change on the MRI.    He is also going to let us know if he develops other symptoms.        Milus Height MD Surgical Oncology, [facility] and [facility]      Visit Diagnoses: 1. Pancreatic mass     Primary Care Physician: Criselda Peaches, MD

## 2014-06-21 NOTE — Assessment & Plan Note (Signed)
This is likely a mucinous cystic neoplasm of the pancreas.    It may be that this lesion caused pancreatitis?    However, this was a one time, isolated event.    There is a possibility of this harboring a cancer or developing into cancer.  I did offer surgery. However, he is healthy and the cyst has no other concerning features.   He does not want to undergo a major surgery with major complications for a condition which is asymptomatic.    This is not unreasonable.  I will get a repeat MRI/MRCP in 6 months.  We will follow up in a year in person unless we see change on the MRI.    He is also going to let us know if he develops other symptoms.

## 2014-06-21 NOTE — Patient Instructions (Signed)
Follow up with 6 month MRI and visit in 1 year.

## 2014-08-27 ENCOUNTER — Other Ambulatory Visit: Payer: Self-pay

## 2014-08-27 ENCOUNTER — Telehealth: Payer: Self-pay | Admitting: Gastroenterology

## 2014-08-27 DIAGNOSIS — Z1211 Encounter for screening for malignant neoplasm of colon: Secondary | ICD-10-CM

## 2014-08-27 NOTE — Telephone Encounter (Signed)
I have left message for the patient to call back 

## 2014-08-28 ENCOUNTER — Telehealth: Payer: Self-pay

## 2014-08-28 NOTE — Telephone Encounter (Signed)
Patient scheduled for colonoscopy. Spoke with the spouse.

## 2014-08-28 NOTE — Telephone Encounter (Signed)
Patient scheduled for screening colonoscopy. Spoke with spouse who is listed on the release of information form.

## 2014-08-28 NOTE — Telephone Encounter (Signed)
Message copied by Greggory Keen on Tue Aug 28, 2014  7:59 AM ------      Message from: Erskine Emery D      Created: Mon Aug 27, 2014  6:20 PM       Ok to schedule      ----- Message -----         From: Virgina Evener, LPN         Sent: 3/81/0175   4:50 PM           To: Inda Castle, MD            Spoke with Mrs Levi. She says it has been the recommended about of time for a repeat colonoscopy. We are supposed to have the records from Dr Earlean Shawl. She wanted the patient scheduled for the follow up colon, which I did. I will get with Shirlean Mylar about the records unless you have seen them. Please advise.       ------

## 2014-10-02 ENCOUNTER — Encounter: Payer: Self-pay | Admitting: Gastroenterology

## 2014-10-11 ENCOUNTER — Ambulatory Visit (AMBULATORY_SURGERY_CENTER): Payer: Self-pay | Admitting: *Deleted

## 2014-10-11 VITALS — Ht 75.0 in | Wt 227.4 lb

## 2014-10-11 DIAGNOSIS — Z1211 Encounter for screening for malignant neoplasm of colon: Secondary | ICD-10-CM

## 2014-10-11 MED ORDER — NA SULFATE-K SULFATE-MG SULF 17.5-3.13-1.6 GM/177ML PO SOLN
1.0000 | Freq: Once | ORAL | Status: DC
Start: 2014-10-11 — End: 2014-10-18

## 2014-10-11 NOTE — Progress Notes (Signed)
No egg or soy allergy. ewm No home 02 use. ewm No diet pills. ewm One sedation with nose surgery caused anxiety in his 20's, but no other issues. ewm Pt declined emmi. ewm

## 2014-10-18 ENCOUNTER — Encounter: Payer: Self-pay | Admitting: Gastroenterology

## 2014-10-18 ENCOUNTER — Ambulatory Visit (AMBULATORY_SURGERY_CENTER): Payer: Commercial Managed Care - HMO | Admitting: Gastroenterology

## 2014-10-18 VITALS — BP 115/72 | HR 59 | Temp 97.7°F | Resp 19 | Ht 75.0 in | Wt 227.0 lb

## 2014-10-18 DIAGNOSIS — D124 Benign neoplasm of descending colon: Secondary | ICD-10-CM

## 2014-10-18 DIAGNOSIS — K5730 Diverticulosis of large intestine without perforation or abscess without bleeding: Secondary | ICD-10-CM

## 2014-10-18 DIAGNOSIS — D120 Benign neoplasm of cecum: Secondary | ICD-10-CM

## 2014-10-18 DIAGNOSIS — Z1211 Encounter for screening for malignant neoplasm of colon: Secondary | ICD-10-CM

## 2014-10-18 LAB — GLUCOSE, CAPILLARY
Glucose-Capillary: 137 mg/dL — ABNORMAL HIGH (ref 70–99)
Glucose-Capillary: 130 mg/dL — ABNORMAL HIGH (ref 70–99)

## 2014-10-18 MED ORDER — SODIUM CHLORIDE 0.9 % IV SOLN: 500.0000 mL | INTRAVENOUS | Status: DC

## 2014-10-18 NOTE — Patient Instructions (Signed)
YOU HAD AN ENDOSCOPIC PROCEDURE TODAY AT THE [facility]: Refer to the procedure report that was given to you for any specific questions about what was found during the examination.  If the procedure report does not answer your questions, please call your gastroenterologist to clarify.  If you requested that your care partner not be given the details of your procedure findings, then the procedure report has been included in a sealed envelope for you to review at your convenience later.  YOU SHOULD EXPECT: Some feelings of bloating in the abdomen. Passage of more gas than usual.  Walking can help get rid of the air that was put into your GI tract during the procedure and reduce the bloating. If you had a lower endoscopy (such as a colonoscopy or flexible sigmoidoscopy) you may notice spotting of blood in your stool or on the toilet paper. If you underwent a bowel prep for your procedure, then you may not have a normal bowel movement for a few days.  DIET: Your first meal following the procedure should be a light meal and then it is ok to progress to your normal diet.  A half-sandwich or bowl of soup is an example of a good first meal.  Heavy or fried foods are harder to digest and may make you feel nauseous or bloated.  Likewise meals heavy in dairy and vegetables can cause extra gas to form and this can also increase the bloating.  Drink plenty of fluids but you should avoid alcoholic beverages for 24 hours.  ACTIVITY: Your care partner should take you home directly after the procedure.  You should plan to take it easy, moving slowly for the rest of the day.  You can resume normal activity the day after the procedure however you should NOT DRIVE or use heavy machinery for 24 hours (because of the sedation medicines used during the test).    SYMPTOMS TO REPORT IMMEDIATELY: A gastroenterologist can be reached at any hour.  During normal business hours, 8:30 AM to 5:00 PM Monday through Friday,  call [phone].  After hours and on weekends, please call the GI answering service at [phone] who will take a message and have the physician on call contact you.   Following lower endoscopy (colonoscopy or flexible sigmoidoscopy):  Excessive amounts of blood in the stool  Significant tenderness or worsening of abdominal pains  Swelling of the abdomen that is new, acute  Fever of 100F or higher  FOLLOW UP: If any biopsies were taken you will be contacted by phone or by letter within the next 1-3 weeks.  Call your gastroenterologist if you have not heard about the biopsies in 3 weeks.  Our staff will call the home number listed on your records the next business day following your procedure to check on you and address any questions or concerns that you may have at that time regarding the information given to you following your procedure. This is a courtesy call and so if there is no answer at the home number and we have not heard from you through the emergency physician on call, we will assume that you have returned to your regular daily activities without incident.  SIGNATURES/CONFIDENTIALITY: You and/or your care partner have signed paperwork which will be entered into your electronic medical record.  These signatures attest to the fact that that the information above on your After Visit Summary has been reviewed and is understood.  Full responsibility of the confidentiality of this   discharge information lies with you and/or your care-partner.  Polyp, diverticulosis,high fiber diet information given.

## 2014-10-18 NOTE — Progress Notes (Signed)
Patient awakening,vss,report to rn 

## 2014-10-18 NOTE — Op Note (Signed)
[place]  [address]. [place], [id number]   COLONOSCOPY PROCEDURE REPORT  PATIENT: Maurice Perkins, Maurice Perkins  MR#: #[id number] BIRTHDATE: 03-Apr-1946 , 39  yrs. old GENDER: male ENDOSCOPIST: Inda Castle, MD REFERRED SA:YTKZS Nyoka Cowden, M.D. PROCEDURE DATE:  10/18/2014 PROCEDURE:   Colonoscopy with snare polypectomy and Colonoscopy with cold biopsy polypectomy First Screening Colonoscopy - Avg.  risk and is 50 yrs.  old or older - No.  Prior Negative Screening - Now for repeat screening. 10 or more years since last screening  History of Adenoma - Now for follow-up colonoscopy & has been > or = to 3 yrs.  N/A  Polyps Removed Today? Yes. ASA CLASS:   Class II INDICATIONS:average risk for colon cancer. MEDICATIONS: Monitored anesthesia care and Propofol 250 mg IV  DESCRIPTION OF PROCEDURE:   After the risks benefits and alternatives of the procedure were thoroughly explained, informed consent was obtained.  The digital rectal exam revealed no abnormalities of the rectum.   The LB [id number]  endoscope was introduced through the anus and advanced to the cecum, which was identified by both the appendix and ileocecal valve. No adverse events experienced.   The quality of the prep was Suprep good  The instrument was then slowly withdrawn as the colon was fully examined.      COLON FINDINGS: A sessile polyp measuring 2 mm in size was found at the cecum.  A polypectomy was performed with cold forceps.   Two sessile polyps measuring 4 mm in size were found at the cecum. Polypectomies were performed with a cold snare.  The resection was complete, the polyp tissue was completely retrieved and sent to histology.   Two sessile polyps measuring 4 mm in size were found in the distal descending colon.  Polypectomies were performed with a cold snare.  The resection was complete, the polyp tissue was completely retrieved and sent to histology.   A sessile polyp measuring 6  mm in size was found in the distal descending colon.  A polypectomy was performed with a cold snare.  The resection was complete, the polyp tissue was completely retrieved and sent to histology.   There was mild diverticulosis noted in the sigmoid colon.  Retroflexed views revealed no abnormalities. The time to cecum=1 minutes 56 seconds.  Withdrawal time=14 minutes 12 seconds. The scope was withdrawn and the procedure completed. COMPLICATIONS: There were no immediate complications.  ENDOSCOPIC IMPRESSION: 1.   Sessile polyp was found at the cecum; polypectomy was performed with cold forceps 2.   Two sessile polyps were found at the cecum; polypectomies were performed with a cold snare 3.   Two sessile polyps were found in the distal descending colon; polypectomies were performed with a cold snare 4.   Sessile polyp was found in the distal descending colon; polypectomy was performed with a cold snare 5.   Mild diverticulosis was noted in the sigmoid colon  RECOMMENDATIONS: 1.  If the polyp(s) removed today are proven to be adenomatous (pre-cancerous) polyps, you will need a colonoscopy in 3 years. Otherwise you should continue to follow colorectal cancer screening guidelines for "routine risk" patients with a colonoscopy in 10 years.  You will receive a letter within 1-2 weeks with the results of your biopsy as well as final recommendations.  Please call my office if you have not received a letter after 3 weeks. 2.  Avoid all NSAIDS for the next 2 weeks.  eSigned:  Inda Castle, MD 10/18/2014  2:10 PM   cc:   PATIENT NAME:  Rocky, Gladden MR#: #[id number]

## 2014-10-18 NOTE — Progress Notes (Signed)
Called to room to assist during endoscopic procedure.  Patient ID and intended procedure confirmed with present staff. Received instructions for my participation in the procedure from the performing physician.  

## 2014-10-19 ENCOUNTER — Telehealth: Payer: Self-pay | Admitting: *Deleted

## 2014-10-19 NOTE — Telephone Encounter (Signed)
  Follow up Call-  Call back number 10/18/2014  Post procedure Call Back phone  # [phone]     Patient questions:  Do you have a fever, pain , or abdominal swelling? No. Pain Score  0 *  Have you tolerated food without any problems? Yes.    Have you been able to return to your normal activities? Yes.    Do you have any questions about your discharge instructions: Diet   No. Medications  No. Follow up visit  No.  Do you have questions or concerns about your Care? No.  Actions: * If pain score is 4 or above: No action needed, pain <4.

## 2014-10-25 ENCOUNTER — Encounter: Payer: Self-pay | Admitting: Gastroenterology

## 2015-02-14 DIAGNOSIS — I10 Essential (primary) hypertension: Secondary | ICD-10-CM | POA: Diagnosis not present

## 2015-02-14 DIAGNOSIS — E1165 Type 2 diabetes mellitus with hyperglycemia: Secondary | ICD-10-CM | POA: Diagnosis not present

## 2015-05-27 DIAGNOSIS — E1165 Type 2 diabetes mellitus with hyperglycemia: Secondary | ICD-10-CM | POA: Diagnosis not present

## 2015-05-27 DIAGNOSIS — I10 Essential (primary) hypertension: Secondary | ICD-10-CM | POA: Diagnosis not present

## 2015-05-27 DIAGNOSIS — E118 Type 2 diabetes mellitus with unspecified complications: Secondary | ICD-10-CM | POA: Diagnosis not present

## 2015-08-06 ENCOUNTER — Other Ambulatory Visit: Payer: Self-pay | Admitting: General Surgery

## 2015-08-06 DIAGNOSIS — K8689 Other specified diseases of pancreas: Secondary | ICD-10-CM

## 2015-08-06 NOTE — Addendum Note (Signed)
Addended by: Stark Klein on: 08/06/2015 04:12 PM   Modules accepted: Orders

## 2015-08-12 ENCOUNTER — Other Ambulatory Visit: Payer: Self-pay

## 2015-08-12 DIAGNOSIS — K8689 Other specified diseases of pancreas: Secondary | ICD-10-CM

## 2015-08-13 ENCOUNTER — Other Ambulatory Visit: Payer: Self-pay | Admitting: General Surgery

## 2015-08-13 DIAGNOSIS — K8689 Other specified diseases of pancreas: Secondary | ICD-10-CM

## 2015-08-14 ENCOUNTER — Other Ambulatory Visit: Payer: Self-pay | Admitting: General Surgery

## 2015-08-26 ENCOUNTER — Ambulatory Visit
Admission: RE | Admit: 2015-08-26 | Discharge: 2015-08-26 | Disposition: A | Discharge status: Home / Self Care | Payer: Commercial Managed Care - HMO | Source: Ambulatory Visit | Attending: General Surgery | Admitting: General Surgery

## 2015-08-26 DIAGNOSIS — K8689 Other specified diseases of pancreas: Secondary | ICD-10-CM

## 2015-08-26 DIAGNOSIS — K862 Cyst of pancreas: Secondary | ICD-10-CM | POA: Diagnosis not present

## 2015-08-26 MED ORDER — GADOBENATE DIMEGLUMINE 529 MG/ML IV SOLN
10.0000 mL | Freq: Once | INTRAVENOUS | Status: AC | PRN
  Administered 2015-08-26: 10 mL via INTRAVENOUS

## 2015-09-25 ENCOUNTER — Emergency Department (HOSPITAL_COMMUNITY): Payer: Commercial Managed Care - HMO | Admitting: Certified Registered Nurse Anesthetist

## 2015-09-25 ENCOUNTER — Encounter (HOSPITAL_COMMUNITY): Payer: Self-pay | Admitting: Family Medicine

## 2015-09-25 ENCOUNTER — Emergency Department (HOSPITAL_COMMUNITY): Payer: Commercial Managed Care - HMO

## 2015-09-25 ENCOUNTER — Encounter (HOSPITAL_COMMUNITY): Admission: EM | Disposition: A | Discharge status: Home / Self Care | Payer: Self-pay | Source: Home / Self Care

## 2015-09-25 ENCOUNTER — Inpatient Hospital Stay (HOSPITAL_COMMUNITY)
Admission: EM | Admit: 2015-09-25 | Discharge: 2015-10-02 | DRG: 330 | Disposition: A | Discharge status: Home / Self Care | Payer: Commercial Managed Care - HMO | Attending: Surgery | Admitting: Surgery

## 2015-09-25 ENCOUNTER — Encounter (HOSPITAL_COMMUNITY): Payer: Self-pay | Admitting: *Deleted

## 2015-09-25 ENCOUNTER — Emergency Department (HOSPITAL_COMMUNITY)
Admission: EM | Admit: 2015-09-25 | Discharge: 2015-09-25 | Disposition: A | Discharge status: Other Acute Inpatient Hospital | Payer: Commercial Managed Care - HMO | Source: Home / Self Care

## 2015-09-25 DIAGNOSIS — Z87891 Personal history of nicotine dependence: Secondary | ICD-10-CM | POA: Diagnosis not present

## 2015-09-25 DIAGNOSIS — E1122 Type 2 diabetes mellitus with diabetic chronic kidney disease: Secondary | ICD-10-CM | POA: Diagnosis present

## 2015-09-25 DIAGNOSIS — N182 Chronic kidney disease, stage 2 (mild): Secondary | ICD-10-CM | POA: Diagnosis present

## 2015-09-25 DIAGNOSIS — K5793 Diverticulitis of intestine, part unspecified, without perforation or abscess with bleeding: Secondary | ICD-10-CM | POA: Diagnosis not present

## 2015-09-25 DIAGNOSIS — R112 Nausea with vomiting, unspecified: Secondary | ICD-10-CM

## 2015-09-25 DIAGNOSIS — Z794 Long term (current) use of insulin: Secondary | ICD-10-CM

## 2015-09-25 DIAGNOSIS — L0591 Pilonidal cyst without abscess: Secondary | ICD-10-CM | POA: Diagnosis present

## 2015-09-25 DIAGNOSIS — E876 Hypokalemia: Secondary | ICD-10-CM | POA: Diagnosis present

## 2015-09-25 DIAGNOSIS — J9811 Atelectasis: Secondary | ICD-10-CM | POA: Diagnosis not present

## 2015-09-25 DIAGNOSIS — E1110 Type 2 diabetes mellitus with ketoacidosis without coma: Secondary | ICD-10-CM

## 2015-09-25 DIAGNOSIS — I129 Hypertensive chronic kidney disease with stage 1 through stage 4 chronic kidney disease, or unspecified chronic kidney disease: Secondary | ICD-10-CM | POA: Diagnosis present

## 2015-09-25 DIAGNOSIS — E785 Hyperlipidemia, unspecified: Secondary | ICD-10-CM | POA: Diagnosis present

## 2015-09-25 DIAGNOSIS — K9189 Other postprocedural complications and disorders of digestive system: Secondary | ICD-10-CM

## 2015-09-25 DIAGNOSIS — K668 Other specified disorders of peritoneum: Secondary | ICD-10-CM

## 2015-09-25 DIAGNOSIS — R000 Tachycardia, unspecified: Secondary | ICD-10-CM | POA: Diagnosis not present

## 2015-09-25 DIAGNOSIS — K5710 Diverticulosis of small intestine without perforation or abscess without bleeding: Secondary | ICD-10-CM | POA: Diagnosis not present

## 2015-09-25 DIAGNOSIS — R109 Unspecified abdominal pain: Secondary | ICD-10-CM | POA: Diagnosis not present

## 2015-09-25 DIAGNOSIS — K5712 Diverticulitis of small intestine without perforation or abscess without bleeding: Secondary | ICD-10-CM

## 2015-09-25 DIAGNOSIS — K5700 Diverticulitis of small intestine with perforation and abscess without bleeding: Secondary | ICD-10-CM | POA: Diagnosis present

## 2015-09-25 DIAGNOSIS — K631 Perforation of intestine (nontraumatic): Secondary | ICD-10-CM | POA: Diagnosis not present

## 2015-09-25 DIAGNOSIS — K598 Other specified functional intestinal disorders: Secondary | ICD-10-CM | POA: Diagnosis not present

## 2015-09-25 DIAGNOSIS — K567 Ileus, unspecified: Secondary | ICD-10-CM | POA: Diagnosis not present

## 2015-09-25 DIAGNOSIS — Z01811 Encounter for preprocedural respiratory examination: Secondary | ICD-10-CM | POA: Diagnosis not present

## 2015-09-25 DIAGNOSIS — Z7982 Long term (current) use of aspirin: Secondary | ICD-10-CM | POA: Diagnosis not present

## 2015-09-25 DIAGNOSIS — R198 Other specified symptoms and signs involving the digestive system and abdomen: Secondary | ICD-10-CM | POA: Diagnosis present

## 2015-09-25 DIAGNOSIS — R1032 Left lower quadrant pain: Secondary | ICD-10-CM | POA: Diagnosis present

## 2015-09-25 HISTORY — PX: ABDOMINAL EXPLORATION SURGERY: SHX538

## 2015-09-25 HISTORY — PX: LAPAROTOMY: SHX154

## 2015-09-25 LAB — COMPREHENSIVE METABOLIC PANEL
ALBUMIN: 3.9 g/dL (ref 3.5–5.0)
ALT: 37 U/L (ref 17–63)
ANION GAP: 14 (ref 5–15)
AST: 34 U/L (ref 15–41)
Alkaline Phosphatase: 39 U/L (ref 38–126)
BILIRUBIN TOTAL: 1.6 mg/dL — AB (ref 0.3–1.2)
BUN: 23 mg/dL — ABNORMAL HIGH (ref 6–20)
CHLORIDE: 93 mmol/L — AB (ref 101–111)
CO2: 26 mmol/L (ref 22–32)
Calcium: 8.9 mg/dL (ref 8.9–10.3)
Creatinine, Ser: 1.79 mg/dL — ABNORMAL HIGH (ref 0.61–1.24)
GFR calc Af Amer: 42 mL/min — ABNORMAL LOW (ref >60)
GFR, EST NON AFRICAN AMERICAN: 36 mL/min — AB (ref >60)
Glucose, Bld: 255 mg/dL — ABNORMAL HIGH (ref 65–99)
POTASSIUM: 3.6 mmol/L (ref 3.5–5.1)
Sodium: 133 mmol/L — ABNORMAL LOW (ref 135–145)
TOTAL PROTEIN: 7.1 g/dL (ref 6.5–8.1)

## 2015-09-25 LAB — CBC
HEMATOCRIT: 44.2 % (ref 39.0–52.0)
HEMOGLOBIN: 15.3 g/dL (ref 13.0–17.0)
MCH: 30.1 pg (ref 26.0–34.0)
MCHC: 34.6 g/dL (ref 30.0–36.0)
MCV: 86.8 fL (ref 78.0–100.0)
Platelets: 226 10*3/uL (ref 150–400)
RBC: 5.09 MIL/uL (ref 4.22–5.81)
RDW: 14.2 % (ref 11.5–15.5)
WBC: 12.8 10*3/uL — AB (ref 4.0–10.5)

## 2015-09-25 LAB — LIPASE, BLOOD: LIPASE: 14 U/L — AB (ref 22–51)

## 2015-09-25 SURGERY — LAPAROTOMY, EXPLORATORY
Anesthesia: General | Site: Abdomen

## 2015-09-25 MED ORDER — DEXAMETHASONE SODIUM PHOSPHATE 10 MG/ML IJ SOLN
INTRAMUSCULAR | Status: AC
  Filled 2015-09-25: qty 1

## 2015-09-25 MED ORDER — METOCLOPRAMIDE HCL 5 MG/ML IJ SOLN
10.0000 mg | Freq: Once | INTRAMUSCULAR | Status: AC
  Administered 2015-09-25: 10 mg via INTRAVENOUS
  Filled 2015-09-25: qty 2

## 2015-09-25 MED ORDER — ONDANSETRON HCL 4 MG/2ML IJ SOLN
INTRAMUSCULAR | Status: AC
  Filled 2015-09-25: qty 4

## 2015-09-25 MED ORDER — FENTANYL CITRATE (PF) 250 MCG/5ML IJ SOLN
INTRAMUSCULAR | Status: AC
  Filled 2015-09-25: qty 5

## 2015-09-25 MED ORDER — LIDOCAINE HCL (CARDIAC) 20 MG/ML IV SOLN
INTRAVENOUS | Status: AC
  Filled 2015-09-25: qty 15

## 2015-09-25 MED ORDER — ONDANSETRON HCL 4 MG/2ML IJ SOLN
4.0000 mg | Freq: Once | INTRAMUSCULAR | Status: AC
  Administered 2015-09-25: 4 mg via INTRAVENOUS
  Filled 2015-09-25: qty 2

## 2015-09-25 MED ORDER — ARTIFICIAL TEARS OP OINT
TOPICAL_OINTMENT | OPHTHALMIC | Status: AC
  Filled 2015-09-25: qty 3.5

## 2015-09-25 MED ORDER — ONDANSETRON 4 MG PO TBDP
8.0000 mg | ORAL_TABLET | Freq: Once | ORAL | Status: AC
  Administered 2015-09-25: 8 mg via ORAL

## 2015-09-25 MED ORDER — IOHEXOL 300 MG/ML  SOLN
25.0000 mL | Freq: Once | INTRAMUSCULAR | Status: AC | PRN
  Administered 2015-09-25: 25 mL via ORAL

## 2015-09-25 MED ORDER — METRONIDAZOLE IN NACL 5-0.79 MG/ML-% IV SOLN
500.0000 mg | Freq: Once | INTRAVENOUS | Status: AC
  Administered 2015-09-26: 500 mg via INTRAVENOUS
  Filled 2015-09-25 (×2): qty 100

## 2015-09-25 MED ORDER — MORPHINE SULFATE (PF) 4 MG/ML IV SOLN
4.0000 mg | Freq: Once | INTRAVENOUS | Status: AC
  Administered 2015-09-25: 4 mg via INTRAVENOUS
  Filled 2015-09-25: qty 1

## 2015-09-25 MED ORDER — PHENYLEPHRINE 40 MCG/ML (10ML) SYRINGE FOR IV PUSH (FOR BLOOD PRESSURE SUPPORT)
PREFILLED_SYRINGE | INTRAVENOUS | Status: AC
  Filled 2015-09-25: qty 20

## 2015-09-25 MED ORDER — IOHEXOL 300 MG/ML  SOLN
80.0000 mL | Freq: Once | INTRAMUSCULAR | Status: AC | PRN
  Administered 2015-09-25: 80 mL via INTRAVENOUS

## 2015-09-25 MED ORDER — ONDANSETRON 4 MG PO TBDP
ORAL_TABLET | ORAL | Status: AC
  Filled 2015-09-25: qty 2

## 2015-09-25 MED ORDER — SODIUM CHLORIDE 0.9 % IV BOLUS (SEPSIS)
1000.0000 mL | Freq: Once | INTRAVENOUS | Status: AC
  Administered 2015-09-25: 1000 mL via INTRAVENOUS

## 2015-09-25 MED ORDER — PROPOFOL 10 MG/ML IV BOLUS
INTRAVENOUS | Status: AC
  Filled 2015-09-25: qty 20

## 2015-09-25 MED ORDER — ROCURONIUM BROMIDE 50 MG/5ML IV SOLN
INTRAVENOUS | Status: AC
  Filled 2015-09-25: qty 2

## 2015-09-25 MED ORDER — GLYCOPYRROLATE 0.2 MG/ML IJ SOLN
INTRAMUSCULAR | Status: AC
  Filled 2015-09-25: qty 5

## 2015-09-25 MED ORDER — CIPROFLOXACIN IN D5W 400 MG/200ML IV SOLN
400.0000 mg | Freq: Once | INTRAVENOUS | Status: AC
  Administered 2015-09-25: 400 mg via INTRAVENOUS
  Filled 2015-09-25 (×2): qty 200

## 2015-09-25 SURGICAL SUPPLY — 57 items
BLADE SURG ROTATE 9660 (MISCELLANEOUS) ×3 IMPLANT
BNDG GAUZE ELAST 4 BULKY (GAUZE/BANDAGES/DRESSINGS) ×3 IMPLANT
CANISTER SUCTION 2500CC (MISCELLANEOUS) ×3 IMPLANT
CHLORAPREP W/TINT 26ML (MISCELLANEOUS) ×3 IMPLANT
COVER MAYO STAND STRL (DRAPES) ×3 IMPLANT
COVER SURGICAL LIGHT HANDLE (MISCELLANEOUS) ×3 IMPLANT
DRAPE LAPAROSCOPIC ABDOMINAL (DRAPES) ×3 IMPLANT
DRAPE PROXIMA HALF (DRAPES) IMPLANT
DRAPE UTILITY XL STRL (DRAPES) ×6 IMPLANT
DRAPE WARM FLUID 44X44 (DRAPE) ×3 IMPLANT
DRSG OPSITE POSTOP 4X10 (GAUZE/BANDAGES/DRESSINGS) IMPLANT
DRSG OPSITE POSTOP 4X8 (GAUZE/BANDAGES/DRESSINGS) IMPLANT
DRSG PAD ABDOMINAL 8X10 ST (GAUZE/BANDAGES/DRESSINGS) ×3 IMPLANT
ELECT BLADE 6.5 EXT (BLADE) IMPLANT
ELECT CAUTERY BLADE 6.4 (BLADE) ×3 IMPLANT
ELECT REM PT RETURN 9FT ADLT (ELECTROSURGICAL) ×3
ELECTRODE REM PT RTRN 9FT ADLT (ELECTROSURGICAL) ×2 IMPLANT
GLOVE BIO SURGEON STRL SZ 6 (GLOVE) ×6 IMPLANT
GLOVE BIO SURGEON STRL SZ7 (GLOVE) ×3 IMPLANT
GLOVE BIOGEL PI IND STRL 6.5 (GLOVE) ×2 IMPLANT
GLOVE BIOGEL PI IND STRL 7.5 (GLOVE) ×2 IMPLANT
GLOVE BIOGEL PI INDICATOR 6.5 (GLOVE) ×1
GLOVE BIOGEL PI INDICATOR 7.5 (GLOVE) ×1
GOWN STRL REUS W/ TWL LRG LVL3 (GOWN DISPOSABLE) ×4 IMPLANT
GOWN STRL REUS W/TWL 2XL LVL3 (GOWN DISPOSABLE) ×3 IMPLANT
GOWN STRL REUS W/TWL LRG LVL3 (GOWN DISPOSABLE) ×2
KIT BASIN OR (CUSTOM PROCEDURE TRAY) ×3 IMPLANT
KIT ROOM TURNOVER OR (KITS) ×3 IMPLANT
LIGASURE IMPACT 36 18CM CVD LR (INSTRUMENTS) ×3 IMPLANT
MARKER SKIN DUAL TIP RULER LAB (MISCELLANEOUS) ×3 IMPLANT
NS IRRIG 1000ML POUR BTL (IV SOLUTION) ×6 IMPLANT
PACK GENERAL/GYN (CUSTOM PROCEDURE TRAY) ×3 IMPLANT
PAD ARMBOARD 7.5X6 YLW CONV (MISCELLANEOUS) ×3 IMPLANT
PENCIL BUTTON HOLSTER BLD 10FT (ELECTRODE) IMPLANT
RELOAD PROXIMATE 75MM BLUE (ENDOMECHANICALS) ×6 IMPLANT
SPECIMEN JAR LARGE (MISCELLANEOUS) ×3 IMPLANT
SPONGE LAP 18X18 X RAY DECT (DISPOSABLE) ×6 IMPLANT
STAPLER GUN LINEAR PROX 60 (STAPLE) ×3 IMPLANT
STAPLER PROXIMATE 75MM BLUE (STAPLE) ×3 IMPLANT
STAPLER VISISTAT 35W (STAPLE) IMPLANT
SUCTION POOLE TIP (SUCTIONS) ×3 IMPLANT
SUT PDS AB 1 TP1 96 (SUTURE) ×6 IMPLANT
SUT PDS II 0 TP-1 LOOPED 60 (SUTURE) ×6 IMPLANT
SUT VIC AB 2-0 SH 18 (SUTURE) ×3 IMPLANT
SUT VIC AB 3-0 SH 18 (SUTURE) ×3 IMPLANT
SUT VIC AB 3-0 SH 27 (SUTURE) ×1
SUT VIC AB 3-0 SH 27XBRD (SUTURE) ×2 IMPLANT
SUT VICRYL 4-0 PS2 18IN ABS (SUTURE) IMPLANT
SUT VICRYL AB 2 0 TIES (SUTURE) ×3 IMPLANT
SUT VICRYL AB 3 0 TIES (SUTURE) ×3 IMPLANT
TAPE CLOTH SURG 6X10 WHT LF (GAUZE/BANDAGES/DRESSINGS) ×3 IMPLANT
TOWEL OR 17X24 6PK STRL BLUE (TOWEL DISPOSABLE) ×3 IMPLANT
TOWEL OR 17X26 10 PK STRL BLUE (TOWEL DISPOSABLE) ×3 IMPLANT
TRAY FOLEY CATH 16FRSI W/METER (SET/KITS/TRAYS/PACK) IMPLANT
TRAY FOLEY W/METER SILVER 16FR (SET/KITS/TRAYS/PACK) ×3 IMPLANT
TUBE CONNECTING 12X1/4 (SUCTIONS) IMPLANT
YANKAUER SUCT BULB TIP NO VENT (SUCTIONS) ×3 IMPLANT

## 2015-09-25 NOTE — H&P (Signed)
BRETTON TANDY is an 69 y.o. male.   Chief Complaint: abdominal pain HPI:  Patient is a 69 year old male who presents with 24 hours of left mid abdominal pain. He has never experienced pain like this before, but stated it was very severe. It has gotten a little bit better with pain medication that he received in the emergency department. He did not take his temperature, but subjectively felt as though he had a fever with chills and sweats. He has had a colonoscopy in the past and has a history of diverticuli in his colon. His current abdominal pain is associated with anorexia, nausea, and vomiting. He also feels bloated. He has never had an episode of diverticulitis in the past.  I have been following the patient for a cystic lesion in the pancreas. This has been unchanged on serial MRI.  He also has a history of type 2 diabetes as well as hypertension with complications of chronic kidney disease.  Past Medical History  Diagnosis Date  . Diabetes ([place])   . Hypertension   . Dry skin     pt has skin oil deficiency ischtiosis  . Hyperlipidemia   . Unspecified disease of pancreas   . Other general symptoms(780.99)   . Pancreatic mass     Past Surgical History  Procedure Laterality Date  . Shoulder surgery Right 2000  . Nasal sinus surgery  1970's  . Eus N/A 05/17/2014    Procedure: UPPER ENDOSCOPIC ULTRASOUND (EUS) LINEAR;  Surgeon: Milus Banister, MD;  Location: [facility];  Service: Endoscopy;  Laterality: N/A;  . Colonoscopy      2004 dr Earlean Shawl  . Pilonodal cyst      in high school-bottom of spine    Family History  Problem Relation Age of Onset  . Cancer Mother     breast  . Heart disease Mother   . Colon cancer Neg Hx   . Stomach cancer Neg Hx   . Ulcerative colitis Neg Hx    Social History:  reports that he quit smoking about 43 years ago. His smoking use included Cigarettes. He has a 30 pack-year smoking history. He has never used smokeless tobacco. He reports that he  does not drink alcohol or use illicit drugs.  Allergies:  Allergies  Allergen Reactions  . Penicillins Hives   Medications (Discharged within 1 Day(s)) Outpatient Medications (12) Hospital Medications (2) Clinic-Administered Medications (0)   acetaminophen-codeine (TYLENOL #3) 300-30 MG tablet   aspirin 81 MG tablet   atenolol (TENORMIN) 50 MG tablet   atorvastatin (LIPITOR) 40 MG tablet   fenofibrate 160 MG tablet   glimepiride (AMARYL) 4 MG tablet   Insulin Glargine (LANTUS) 100 UNIT/ML Solostar Pen   insulin glulisine (APIDRA) 100 UNIT/ML injection   losartan-hydrochlorothiazide (HYZAAR) 50-12.5 MG per tablet   metFORMIN (GLUCOPHAGE) 500 MG tablet   Misc Natural Products (GLUCOSAMINE CHONDROITIN MSM PO)   Multiple Vitamin (MULTIVITAMIN WITH MINERALS) TABS tablet         Results for orders placed or performed during the hospital encounter of 09/25/15 (from the past 48 hour(s))  Lipase, blood     Status: Abnormal   Collection Time: 09/25/15  5:41 PM  Result Value Ref Range   Lipase 14 (L) 22 - 51 U/L  Comprehensive metabolic panel     Status: Abnormal   Collection Time: 09/25/15  5:41 PM  Result Value Ref Range   Sodium 133 (L) 135 - 145 mmol/L   Potassium 3.6 3.5 - 5.1  mmol/L   Chloride 93 (L) 101 - 111 mmol/L   CO2 26 22 - 32 mmol/L   Glucose, Bld 255 (H) 65 - 99 mg/dL   BUN 23 (H) 6 - 20 mg/dL   Creatinine, Ser 1.79 (H) 0.61 - 1.24 mg/dL   Calcium 8.9 8.9 - 10.3 mg/dL   Total Protein 7.1 6.5 - 8.1 g/dL   Albumin 3.9 3.5 - 5.0 g/dL   AST 34 15 - 41 U/L   ALT 37 17 - 63 U/L   Alkaline Phosphatase 39 38 - 126 U/L   Total Bilirubin 1.6 (H) 0.3 - 1.2 mg/dL   GFR calc non Af Amer 36 (L) >60 mL/min   GFR calc Af Amer 42 (L) >60 mL/min    Comment: (NOTE) The eGFR has been calculated using the CKD EPI equation. This calculation has not been validated in all clinical situations. eGFR's persistently <60 mL/min signify possible Chronic Kidney Disease.    Anion gap  14 5 - 15  CBC     Status: Abnormal   Collection Time: 09/25/15  5:41 PM  Result Value Ref Range   WBC 12.8 (H) 4.0 - 10.5 K/uL   RBC 5.09 4.22 - 5.81 MIL/uL   Hemoglobin 15.3 13.0 - 17.0 g/dL   HCT 44.2 39.0 - 52.0 %   MCV 86.8 78.0 - 100.0 fL   MCH 30.1 26.0 - 34.0 pg   MCHC 34.6 30.0 - 36.0 g/dL   RDW 14.2 11.5 - 15.5 %   Platelets 226 150 - 400 K/uL   Dg Chest 2 View  09/25/2015  CLINICAL DATA:  69 year old male - preoperative respiratory examination for bowel perforation surgery. EXAM: CHEST  2 VIEW COMPARISON:  09/25/2015 CT FINDINGS: This is a low volume film with mild bibasilar atelectasis. The cardiomediastinal silhouette is unremarkable. There is no evidence of focal airspace disease, pulmonary edema, suspicious pulmonary nodule/mass, pleural effusion, or pneumothorax. No acute bony abnormalities are identified. Pneumoperitoneum underlying the right hemidiaphragm is again identified. IMPRESSION: Low volume film with mild bibasilar atelectasis. Pneumoperitoneum again identified. Electronically Signed   By: Margarette Canada M.D.   On: 09/25/2015 23:06   Ct Abdomen Pelvis W Contrast  09/25/2015  CLINICAL DATA:  Left lower quadrant pain and fever. History of diverticulosis. Symptoms began yesterday. Reduced contrast dose given due to GFR 36. EXAM: CT ABDOMEN AND PELVIS WITH CONTRAST TECHNIQUE: Multidetector CT imaging of the abdomen and pelvis was performed using the standard protocol following bolus administration of intravenous contrast. CONTRAST:  15m OMNIPAQUE IOHEXOL 300 MG/ML  SOLN COMPARISON:  MRI abdomen 08/26/2015. CT abdomen and pelvis 05/04/2014. FINDINGS: Mild dependent changes in the lung bases. Small esophageal hiatal hernia. Free intra-abdominal air demonstrated throughout the upper abdomen with multiple small gas collections present. In the absence of recent surgery, this appearance is suspicious for bowel perforation. No free fluid or free contrast material is demonstrated.  There is focal mesenteric stranding in the left mid abdomen anteriorly, associated with an abnormal small bowel segment. The small bowel at this location demonstrates mild wall thickening. Gas is present around the area. This is likely the site of perforation. This could be due to inflammatory bowel disease or possibly of small-bowel diverticulitis. Diffuse fatty infiltration of the liver. Small low-attenuation lesion with peripheral enhancement in the medial segment left lobe of liver measuring about 2 cm diameter. This is unchanged since previous study and probably represents a hemangioma. Gallbladder, spleen, adrenal glands, abdominal aorta, inferior vena cava, and retroperitoneal lymph  nodes are unremarkable. Circumscribed low-attenuation lesions in both kidneys, largest on the right measuring 3.2 cm diameter, consistent with cysts. No hydronephrosis in either kidney. Renal nephrograms are symmetrical. Septated appearing cystic lesion in the uncinate process of the pancreas measuring 3.1 cm diameter. This is unchanged since previous study. This is likely to represent cystic pancreatic neoplasm. Pelvis: Prostate gland is enlarged. Bladder wall is not thickened. Diverticulosis of sigmoid colon without evidence of diverticulitis. No free or loculated pelvic fluid collections. No pelvic mass or lymphadenopathy. Appendix is normal. Degenerative changes throughout the spine. No destructive bone lesions. IMPRESSION: Diffuse free intra-abdominal air. Focal area of inflammation in a left anterior mid abdominal small bowel loop with associated fat stranding. This is likely the cause of perforation. Consider inflammatory bowel disease versus small bowel diverticulitis. Unchanged appearance of hemangioma in the liver and cystic lesion in the uncinate process of the pancreas. Diffuse fatty infiltration of the liver. These results were called by telephone at the time of interpretation on 09/25/2015 at 9:58 pm to Dr. Zenovia Jarred , who verbally acknowledged these results. Electronically Signed   By: Lucienne Capers M.D.   On: 09/25/2015 22:01    Review of Systems  Constitutional: Positive for fever and chills.  HENT: Negative.   Eyes: Negative.   Respiratory: Negative.   Cardiovascular: Negative.   Gastrointestinal: Positive for nausea and abdominal pain.  Genitourinary: Negative.   Musculoskeletal: Negative.   Skin: Negative.   Neurological: Negative.   Endo/Heme/Allergies: Negative.   Psychiatric/Behavioral: Negative.     Blood pressure 122/59, pulse 97, temperature 98.2 F (36.8 C), temperature source Oral, resp. rate 16, SpO2 94 %. Physical Exam  Constitutional: He is oriented to person, place, and time. He appears well-developed and well-nourished. No distress.  HENT:  Head: Normocephalic and atraumatic.  Eyes: Conjunctivae are normal. Pupils are equal, round, and reactive to light. No scleral icterus.  Neck: Neck supple.  Cardiovascular: Normal rate, regular rhythm and intact distal pulses.   Respiratory: Effort normal. No respiratory distress.  GI: Soft. He exhibits distension. There is tenderness (worst in L mid abdomen, LLQ). There is rebound and guarding.  Musculoskeletal: He exhibits no edema or tenderness.  Neurological: He is alert and oriented to person, place, and time. Coordination normal.  Skin: Skin is warm and dry. No rash noted. He is not diaphoretic. No erythema. No pallor.  flushed  Psychiatric: He has a normal mood and affect. His behavior is normal. Judgment and thought content normal.     Assessment/Plan Perforated viscus DM CKD stage 2 HTN Cystic pancreatic lesion  I discussed with the patient, his 2 children, and his wife, that the best and most expeditious way to treat this process is to perform an exploratory laparotomy. The CT scan as well as the symptoms lead [place] to believe that this is a episode of small bowel diverticulitis with perforation. In this case, we  would perform a small bowel resection with anastomosis. If there were multiple diverticuli that were contiguous and the small bowel, we would resect the entire portion. I discussed that sometimes diverticuli are spread out in the small bowel. I reviewed that diverticuli are not able to be repaired when they perforate, the bowel must be removed.  I discussed that if the CT was misleading and we found a colonic diverticulitis with perforation, that we would need to resect that portion of the bowel and perform an ostomy.  I discussed that we would likely leave the skin open of  the incision in order to minimize wound infection and hernia.  I reviewed the risks of surgery including bleeding, infection, damage to adjacent structures, blood clot, leak of bowel connection, abscess, heart or lung complications, and death.  The patient and family agreed to proceed.  I discussed that I would not attempt to biopsy the pancreatic lesion, as this is in the retroperitoneum. The retroperitoneum is generally protected from intraperitoneal infectious processes.    Zonia Caplin 09/25/2015, 11:34 PM

## 2015-09-25 NOTE — Op Note (Signed)
PRE-OPERATIVE DIAGNOSIS: perforated viscus  POST-OPERATIVE DIAGNOSIS:  Perforated jejunal diverticulitis  PROCEDURE:  Procedure(s): Small bowel resection, drainage of intraabdominal abscess  SURGEON:  Surgeon(s): Stark Klein, MD  ASSIST:   Johnella Moloney, RNFA  ANESTHESIA:   general  DRAINS: none   LOCAL MEDICATIONS USED:  NONE  SPECIMEN:  Source of Specimen:  Small bowel  Culture to microbiology  DISPOSITION OF SPECIMEN:  PATHOLOGY  COUNTS:  YES  DICTATION: .Dragon Dictation  PLAN OF CARE: Admit to inpatient   PATIENT DISPOSITION:  PACU - hemodynamically stable.  FINDINGS:  Perforated small bowel mid jejunum, many diverticuli in small bowel.  Most of the large, thin wall small bowel diverticuli were contained in a segment of jejunum around 35 cm.  This is what was resected.  The remaining diverticuli were proximal in the jejunum.   Moderate contamination  EBL: 75 mL  PROCEDURE:  Patient was identified in the holding area and taken to the operating room where he was placed supine on the operating room table. General endotracheal anesthesia was induced. His abdomen was clipped, prepped, and draped in standard fashion. A timeout was performed according to the surgical safety checklist. When all was correct, we continued.   A midline incision was made above the umbilicus with a #96 blade. The subcutaneous tissues were divided with the cautery. The fascia was entered sharply in the upper midline. Purulent fluid was aspirated from the abdomen and cultured. The fascial incision was carried out the length of the skin incision. The problem area was identified in the jejunum. A suture was used to mark the perforation and to minimize additional contamination.  The small bowel was run proximally and distally.  The small bowel had scattered diverticuli almost to the ligament of treitz.  There was only one that was inflamed.  The ones within around a foot of the perforated one were larger  and thin walled.  The edge of the bowel was identified with a Kelly clamp distal to the perforation on normal appearing bowel. The GIA stapler was used to staple proximal to the large diverticuli. The LigaSure was used to divide the mesentery hemostatically.   A side-to-side, functional end-to-end anastomosis was performed with the GIA stapler. The apex of the anastomosis was reinforced with 3-0 Vicryl suture. The mesenteric defect was run with 2-0 vicryl.  The abdomen was copiously irrigated. The abdomen was examined for hemostasis. A four quadrant inspection was performed to evaluate for pus, blood, or bilious contents. None was seen.  The fascia was closed with #1 looped running PDS suture 2. The skin was irrigated copiously and dressed with Kerlix, ABDs, and tape. The patient was allowed to emerge from anesthesia, extubated, and taken to the PACU in stable condition. Needle, sponge, and instrument counts were correct 2.

## 2015-09-25 NOTE — ED Notes (Signed)
Pt continues to endorse nausea. MD notified.

## 2015-09-25 NOTE — ED Notes (Signed)
Pt  Reports  l  Sided  abd   Pain        Vomited x  2  Today         Pt  Reports    History  Of      Diverticulosis      And  Colon  Problems            Symptoms  Became  Worse      yest          Pt      denys  Any  Diarrhea      He  Is  Sitting  Upright        On  Exam table         Pt     Describes        The  Pain  As  Spasms        He is guarded   Somewhat

## 2015-09-25 NOTE — ED Notes (Signed)
Pt here for left sided abd pain, N,V since yesterday.

## 2015-09-25 NOTE — ED Provider Notes (Addendum)
CSN: [id number]     Arrival date & time 09/25/15  1716 History   First MD Initiated Contact with Patient 09/25/15 1928     Chief Complaint  Patient presents with  . Abdominal Pain     (Consider location/radiation/quality/duration/timing/severity/associated sxs/prior Treatment) HPI   She is a very pleasant 69 year old male presenting with left lower quadrant pain starting yesterday. It started out feeling like stabbing pain. he denies any diarrhea or constipation. Does have a history of diverticulosis no history of diverticulitis.  Denies any fever. Has been eating thinking normally. Mild nausea.  Patient was seen in urgent care and sent here for evaluation.  Past Medical History  Diagnosis Date  . Diabetes ([place])   . Hypertension   . Dry skin     pt has skin oil deficiency ischtiosis  . Hyperlipidemia   . Unspecified disease of pancreas   . Other general symptoms(780.99)   . Pancreatic mass    Past Surgical History  Procedure Laterality Date  . Shoulder surgery Right 2000  . Nasal sinus surgery  1970's  . Eus N/A 05/17/2014    Procedure: UPPER ENDOSCOPIC ULTRASOUND (EUS) LINEAR;  Surgeon: Milus Banister, MD;  Location: [facility];  Service: Endoscopy;  Laterality: N/A;  . Colonoscopy      2004 dr Earlean Shawl  . Pilonodal cyst      in high school-bottom of spine   Family History  Problem Relation Age of Onset  . Cancer Mother     breast  . Heart disease Mother   . Colon cancer Neg Hx   . Stomach cancer Neg Hx   . Ulcerative colitis Neg Hx    Social History  Substance Use Topics  . Smoking status: Former Smoker -- 2.00 packs/day for 15 years    Types: Cigarettes    Quit date: 12/15/1971  . Smokeless tobacco: Never Used  . Alcohol Use: No    Review of Systems  Constitutional: Negative for fever and activity change.  Respiratory: Negative for cough and shortness of breath.   Cardiovascular: Negative for chest pain.  Gastrointestinal: Positive for abdominal pain.  Negative for constipation, anal bleeding and rectal pain.  Genitourinary: Negative for dysuria and urgency.  Allergic/Immunologic: Negative for immunocompromised state.  Psychiatric/Behavioral: Negative for confusion.  All other systems reviewed and are negative.     Allergies  Penicillins  Home Medications   Prior to Admission medications   Medication Sig Start Date End Date Taking? Authorizing Provider  acetaminophen-codeine (TYLENOL #3) 300-30 MG tablet Take 2 tablets by mouth every 4 (four) hours as needed for moderate pain.   Yes Historical Provider, MD  aspirin 81 MG tablet Take 81 mg by mouth daily.   Yes Historical Provider, MD  atenolol (TENORMIN) 50 MG tablet Take 25 mg by mouth every morning.    Yes Historical Provider, MD  atorvastatin (LIPITOR) 40 MG tablet Take 40 mg by mouth daily.   Yes Historical Provider, MD  fenofibrate 160 MG tablet Take 160 mg by mouth daily.   Yes Historical Provider, MD  glimepiride (AMARYL) 4 MG tablet Take 4 mg by mouth 2 (two) times daily.   Yes Historical Provider, MD  Insulin Glargine (LANTUS) 100 UNIT/ML Solostar Pen Inject 60 Units into the skin every morning.    Yes Historical Provider, MD  insulin glulisine (APIDRA) 100 UNIT/ML injection Inject 3 Units into the skin 3 (three) times daily as needed. Sliding scale. Only uses if sugar is running high   Yes Historical  Provider, MD  losartan-hydrochlorothiazide (HYZAAR) 50-12.5 MG per tablet Take 1 tablet by mouth every morning.    Yes Historical Provider, MD  metFORMIN (GLUCOPHAGE) 500 MG tablet Take 500 mg by mouth 5 (five) times daily.    Yes Historical Provider, MD  Misc Natural Products (GLUCOSAMINE CHONDROITIN MSM PO) Take 2 tablets by mouth daily.   Yes Historical Provider, MD  Multiple Vitamin (MULTIVITAMIN WITH MINERALS) TABS tablet Take 1 tablet by mouth daily.   Yes Historical Provider, MD   BP 126/63 mmHg  Pulse 87  Temp(Src) 98.2 F (36.8 C) (Oral)  Resp 16  SpO2 93% Physical  Exam  Constitutional: He is oriented to person, place, and time. He appears well-nourished.  HENT:  Head: Normocephalic.  Mouth/Throat: Oropharynx is clear and moist.  Eyes: Conjunctivae are normal.  Neck: No tracheal deviation present.  Cardiovascular: Normal rate and regular rhythm.   Pulmonary/Chest: Effort normal. No stridor. No respiratory distress.  Abdominal: Soft. There is tenderness. There is no guarding.  Left lower quadrant that tenderness to touch.  Musculoskeletal: Normal range of motion. He exhibits no edema.  Neurological: He is oriented to person, place, and time. No cranial nerve deficit.  Skin: Skin is warm and dry. No rash noted. He is not diaphoretic.  Psychiatric: He has a normal mood and affect. His behavior is normal.  Nursing note and vitals reviewed.   ED Course  Procedures (including critical care time) Labs Review Labs Reviewed  LIPASE, BLOOD - Abnormal; Notable for the following:    Lipase 14 (*)    All other components within normal limits  COMPREHENSIVE METABOLIC PANEL - Abnormal; Notable for the following:    Sodium 133 (*)    Chloride 93 (*)    Glucose, Bld 255 (*)    BUN 23 (*)    Creatinine, Ser 1.79 (*)    Total Bilirubin 1.6 (*)    GFR calc non Af Amer 36 (*)    GFR calc Af Amer 42 (*)    All other components within normal limits  CBC - Abnormal; Notable for the following:    WBC 12.8 (*)    All other components within normal limits  URINALYSIS, ROUTINE W REFLEX MICROSCOPIC (NOT AT [facility])    Imaging Review No results found. I have personally reviewed and evaluated these images and lab results as part of my medical decision-making.   EKG Interpretation   Date/Time:  Wednesday September 25 2015 23:12:54 EDT Ventricular Rate:  105 PR Interval:  200 QRS Duration: 88 QT Interval:  362 QTC Calculation: 478 R Axis:     Text Interpretation:  Sinus tachycardia Abnormal R-wave progression, early  transition Borderline prolonged QT  interval no acute ischemia Confirmed by  Gerald Leitz ([id number]) on 09/25/2015 11:15:43 PM      MDM   Final diagnoses:  None    She is a very pleasant 69 year old male male presenting today with left lower quadrant pain. Concern for diverticulitis. We will get IV, CT abdomen pelvis with contrast. We'll give antiemetic and pain control.  10:34 PM Pt has perforation small bowel. Surgery consulted.   Alam Guterrez Julio Alm, MD 09/25/15 [place], MD 09/25/15 2316

## 2015-09-25 NOTE — ED Provider Notes (Signed)
CSN: [id number]     Arrival date & time 09/25/15  1540 History   None    Chief Complaint  Patient presents with  . Abdominal Pain   (Consider location/radiation/quality/duration/timing/severity/associated sxs/prior Treatment) HPI  Past Medical History  Diagnosis Date  . Diabetes ([place])   . Hypertension   . Dry skin     pt has skin oil deficiency ischtiosis  . Hyperlipidemia   . Unspecified disease of pancreas   . Other general symptoms(780.99)   . Pancreatic mass    Past Surgical History  Procedure Laterality Date  . Shoulder surgery Right 2000  . Nasal sinus surgery  1970's  . Eus N/A 05/17/2014    Procedure: UPPER ENDOSCOPIC ULTRASOUND (EUS) LINEAR;  Surgeon: Milus Banister, MD;  Location: [facility];  Service: Endoscopy;  Laterality: N/A;  . Colonoscopy      2004 dr Earlean Shawl  . Pilonodal cyst      in high school-bottom of spine   Family History  Problem Relation Age of Onset  . Cancer Mother     breast  . Heart disease Mother   . Colon cancer Neg Hx   . Stomach cancer Neg Hx   . Ulcerative colitis Neg Hx    Social History  Substance Use Topics  . Smoking status: Former Smoker -- 2.00 packs/day for 15 years    Types: Cigarettes    Quit date: 12/15/1971  . Smokeless tobacco: Never Used  . Alcohol Use: No    Review of Systems  Allergies  Penicillins  Home Medications   Prior to Admission medications   Medication Sig Start Date End Date Taking? Authorizing Provider  aspirin 81 MG tablet Take 81 mg by mouth daily.    Historical Provider, MD  atenolol (TENORMIN) 50 MG tablet Take 25 mg by mouth every morning.     Historical Provider, MD  atorvastatin (LIPITOR) 40 MG tablet Take 40 mg by mouth daily.    Historical Provider, MD  fenofibrate 160 MG tablet Take 160 mg by mouth daily.    Historical Provider, MD  FREESTYLE LITE test strip  05/23/14   Historical Provider, MD  glimepiride (AMARYL) 4 MG tablet Take 4 mg by mouth 2 (two) times daily.    Historical  Provider, MD  Insulin Glargine (LANTUS) 100 UNIT/ML Solostar Pen Inject 50 Units into the skin every morning.     Historical Provider, MD  insulin glulisine (APIDRA) 100 UNIT/ML injection Inject 3 Units into the skin 3 (three) times daily as needed. Sliding scale. Only uses if sugar is running high    Historical Provider, MD  losartan-hydrochlorothiazide (HYZAAR) 50-12.5 MG per tablet Take 1 tablet by mouth every morning.     Historical Provider, MD  metFORMIN (GLUCOPHAGE) 500 MG tablet Take 500 mg by mouth 4 (four) times daily.     Historical Provider, MD  Misc Natural Products (GLUCOSAMINE CHONDROITIN MSM PO) Take 2 tablets by mouth daily.    Historical Provider, MD  Multiple Vitamin (MULTIVITAMIN WITH MINERALS) TABS tablet Take 1 tablet by mouth daily.    Historical Provider, MD   Meds Ordered and Administered this Visit  Medications - No data to display  BP 113/76 mmHg  Pulse 101  Temp(Src) 99.6 F (37.6 C) (Oral)  Resp 16  SpO2 96% No data found.   Physical Exam  ED Course  Procedures (including critical care time)  Labs Review Labs Reviewed - No data to display  Imaging Review No results found.   Visual  Acuity Review  Right Eye Distance:   Left Eye Distance:   Bilateral Distance:    Right Eye Near:   Left Eye Near:    Bilateral Near:         MDM  Acute abdominal pain Nausea Vomiting  Patient has hx of diverticulosis and has severe left lower quadrant pain and is guarding. Please evaluate and treat   [place], FNP 09/25/15 2007

## 2015-09-25 NOTE — ED Notes (Signed)
Escorted pt to short stay.

## 2015-09-26 ENCOUNTER — Encounter (HOSPITAL_COMMUNITY): Payer: Self-pay | Admitting: General Practice

## 2015-09-26 DIAGNOSIS — K567 Ileus, unspecified: Secondary | ICD-10-CM | POA: Diagnosis not present

## 2015-09-26 DIAGNOSIS — Z7982 Long term (current) use of aspirin: Secondary | ICD-10-CM | POA: Diagnosis not present

## 2015-09-26 DIAGNOSIS — L0591 Pilonidal cyst without abscess: Secondary | ICD-10-CM | POA: Diagnosis present

## 2015-09-26 DIAGNOSIS — R1032 Left lower quadrant pain: Secondary | ICD-10-CM | POA: Diagnosis present

## 2015-09-26 DIAGNOSIS — R198 Other specified symptoms and signs involving the digestive system and abdomen: Secondary | ICD-10-CM | POA: Diagnosis present

## 2015-09-26 DIAGNOSIS — I129 Hypertensive chronic kidney disease with stage 1 through stage 4 chronic kidney disease, or unspecified chronic kidney disease: Secondary | ICD-10-CM | POA: Diagnosis present

## 2015-09-26 DIAGNOSIS — E876 Hypokalemia: Secondary | ICD-10-CM | POA: Diagnosis present

## 2015-09-26 DIAGNOSIS — N182 Chronic kidney disease, stage 2 (mild): Secondary | ICD-10-CM | POA: Diagnosis present

## 2015-09-26 DIAGNOSIS — E785 Hyperlipidemia, unspecified: Secondary | ICD-10-CM | POA: Diagnosis present

## 2015-09-26 DIAGNOSIS — K5700 Diverticulitis of small intestine with perforation and abscess without bleeding: Secondary | ICD-10-CM | POA: Diagnosis present

## 2015-09-26 DIAGNOSIS — E1122 Type 2 diabetes mellitus with diabetic chronic kidney disease: Secondary | ICD-10-CM | POA: Diagnosis present

## 2015-09-26 DIAGNOSIS — Z87891 Personal history of nicotine dependence: Secondary | ICD-10-CM | POA: Diagnosis not present

## 2015-09-26 DIAGNOSIS — Z794 Long term (current) use of insulin: Secondary | ICD-10-CM | POA: Diagnosis not present

## 2015-09-26 LAB — URINALYSIS, ROUTINE W REFLEX MICROSCOPIC
BILIRUBIN URINE: NEGATIVE
Ketones, ur: 15 mg/dL — AB
Leukocytes, UA: NEGATIVE
Nitrite: NEGATIVE
PH: 5 (ref 5.0–8.0)
Protein, ur: NEGATIVE mg/dL
SPECIFIC GRAVITY, URINE: 1.02 (ref 1.005–1.030)
Urobilinogen, UA: 0.2 mg/dL (ref 0.0–1.0)

## 2015-09-26 LAB — URINE MICROSCOPIC-ADD ON

## 2015-09-26 LAB — CBC
HCT: 38.9 % — ABNORMAL LOW (ref 39.0–52.0)
HEMOGLOBIN: 12.8 g/dL — AB (ref 13.0–17.0)
MCH: 29 pg (ref 26.0–34.0)
MCHC: 32.9 g/dL (ref 30.0–36.0)
MCV: 88.2 fL (ref 78.0–100.0)
PLATELETS: 217 10*3/uL (ref 150–400)
RBC: 4.41 MIL/uL (ref 4.22–5.81)
RDW: 14.5 % (ref 11.5–15.5)
WBC: 7.9 10*3/uL (ref 4.0–10.5)

## 2015-09-26 LAB — BASIC METABOLIC PANEL
Anion gap: 13 (ref 5–15)
BUN: 20 mg/dL (ref 6–20)
CHLORIDE: 97 mmol/L — AB (ref 101–111)
CO2: 27 mmol/L (ref 22–32)
Calcium: 7.9 mg/dL — ABNORMAL LOW (ref 8.9–10.3)
Creatinine, Ser: 1.59 mg/dL — ABNORMAL HIGH (ref 0.61–1.24)
GFR, EST AFRICAN AMERICAN: 49 mL/min — AB (ref >60)
GFR, EST NON AFRICAN AMERICAN: 42 mL/min — AB (ref >60)
Glucose, Bld: 277 mg/dL — ABNORMAL HIGH (ref 65–99)
POTASSIUM: 3.8 mmol/L (ref 3.5–5.1)
SODIUM: 137 mmol/L (ref 135–145)

## 2015-09-26 LAB — GLUCOSE, CAPILLARY
GLUCOSE-CAPILLARY: 203 mg/dL — AB (ref 65–99)
GLUCOSE-CAPILLARY: 265 mg/dL — AB (ref 65–99)
GLUCOSE-CAPILLARY: 231 mg/dL — AB (ref 65–99)
GLUCOSE-CAPILLARY: 165 mg/dL — AB (ref 65–99)
Glucose-Capillary: 226 mg/dL — ABNORMAL HIGH (ref 65–99)

## 2015-09-26 LAB — TYPE AND SCREEN
ABO/RH(D): A POS
Antibody Screen: NEGATIVE

## 2015-09-26 LAB — MAGNESIUM: Magnesium: 1.5 mg/dL — ABNORMAL LOW (ref 1.7–2.4)

## 2015-09-26 LAB — PROTIME-INR
INR: 1.07 (ref 0.00–1.49)
Prothrombin Time: 14.1 seconds (ref 11.6–15.2)

## 2015-09-26 LAB — ABO/RH: ABO/RH(D): A POS

## 2015-09-26 LAB — PHOSPHORUS: PHOSPHORUS: 2.2 mg/dL — AB (ref 2.5–4.6)

## 2015-09-26 MED ORDER — 0.9 % SODIUM CHLORIDE (POUR BTL) OPTIME
TOPICAL | Status: DC | PRN
  Administered 2015-09-26: 3000 mL
  Administered 2015-09-26: 1000 mL

## 2015-09-26 MED ORDER — HYDRALAZINE HCL 20 MG/ML IJ SOLN: 10.0000 mg | INTRAMUSCULAR | Status: DC | PRN

## 2015-09-26 MED ORDER — ROCURONIUM BROMIDE 100 MG/10ML IV SOLN
INTRAVENOUS | Status: DC | PRN
  Administered 2015-09-26: 30 mg via INTRAVENOUS
  Administered 2015-09-26 (×2): 10 mg via INTRAVENOUS

## 2015-09-26 MED ORDER — ZOLPIDEM TARTRATE 5 MG PO TABS: 5.0000 mg | ORAL_TABLET | Freq: Every evening | ORAL | Status: DC | PRN

## 2015-09-26 MED ORDER — DIPHENHYDRAMINE HCL 50 MG/ML IJ SOLN: 12.5000 mg | Freq: Four times a day (QID) | INTRAMUSCULAR | Status: DC | PRN

## 2015-09-26 MED ORDER — CETYLPYRIDINIUM CHLORIDE 0.05 % MT LIQD
7.0000 mL | Freq: Two times a day (BID) | OROMUCOSAL | Status: DC
  Administered 2015-09-26 – 2015-10-02 (×7): 7 mL via OROMUCOSAL

## 2015-09-26 MED ORDER — ONDANSETRON HCL 4 MG/2ML IJ SOLN
INTRAMUSCULAR | Status: DC | PRN
  Administered 2015-09-26: 4 mg via INTRAVENOUS

## 2015-09-26 MED ORDER — METHOCARBAMOL 500 MG PO TABS: 500.0000 mg | ORAL_TABLET | Freq: Four times a day (QID) | ORAL | Status: DC | PRN

## 2015-09-26 MED ORDER — ONDANSETRON HCL 4 MG/2ML IJ SOLN
4.0000 mg | Freq: Four times a day (QID) | INTRAMUSCULAR | Status: DC | PRN
  Administered 2015-09-26 – 2015-09-30 (×2): 4 mg via INTRAVENOUS
  Filled 2015-09-26 (×2): qty 2

## 2015-09-26 MED ORDER — FENTANYL CITRATE (PF) 250 MCG/5ML IJ SOLN
INTRAMUSCULAR | Status: AC
  Filled 2015-09-26: qty 5

## 2015-09-26 MED ORDER — SIMETHICONE 80 MG PO CHEW: 40.0000 mg | CHEWABLE_TABLET | Freq: Four times a day (QID) | ORAL | Status: DC | PRN

## 2015-09-26 MED ORDER — ACETAMINOPHEN 160 MG/5ML PO SOLN
325.0000 mg | ORAL | Status: DC | PRN
  Filled 2015-09-26: qty 20.3

## 2015-09-26 MED ORDER — KCL IN DEXTROSE-NACL 20-5-0.45 MEQ/L-%-% IV SOLN
INTRAVENOUS | Status: DC
  Administered 2015-09-26 – 2015-09-28 (×4): via INTRAVENOUS
  Filled 2015-09-26 (×5): qty 1000

## 2015-09-26 MED ORDER — CHLORHEXIDINE GLUCONATE 0.12 % MT SOLN
15.0000 mL | Freq: Two times a day (BID) | OROMUCOSAL | Status: DC
  Administered 2015-09-26 – 2015-10-02 (×11): 15 mL via OROMUCOSAL
  Filled 2015-09-26 (×10): qty 15

## 2015-09-26 MED ORDER — LOSARTAN POTASSIUM-HCTZ 50-12.5 MG PO TABS: 1.0000 | ORAL_TABLET | Freq: Every morning | ORAL | Status: DC

## 2015-09-26 MED ORDER — SUGAMMADEX SODIUM 200 MG/2ML IV SOLN
INTRAVENOUS | Status: DC | PRN
  Administered 2015-09-26: 250 mg via INTRAVENOUS

## 2015-09-26 MED ORDER — INSULIN ASPART 100 UNIT/ML ~~LOC~~ SOLN: 4.0000 [IU] | Freq: Three times a day (TID) | SUBCUTANEOUS | Status: DC

## 2015-09-26 MED ORDER — ENOXAPARIN SODIUM 40 MG/0.4ML ~~LOC~~ SOLN
40.0000 mg | SUBCUTANEOUS | Status: DC
  Administered 2015-09-26 – 2015-10-02 (×7): 40 mg via SUBCUTANEOUS
  Filled 2015-09-26 (×7): qty 0.4

## 2015-09-26 MED ORDER — OXYCODONE HCL 5 MG PO TABS: 5.0000 mg | ORAL_TABLET | ORAL | Status: DC | PRN

## 2015-09-26 MED ORDER — SUGAMMADEX SODIUM 500 MG/5ML IV SOLN
INTRAVENOUS | Status: AC
  Filled 2015-09-26: qty 5

## 2015-09-26 MED ORDER — HYDROCHLOROTHIAZIDE 12.5 MG PO CAPS
12.5000 mg | ORAL_CAPSULE | Freq: Every day | ORAL | Status: DC
  Administered 2015-09-26 – 2015-09-29 (×4): 12.5 mg via ORAL
  Filled 2015-09-26 (×4): qty 1

## 2015-09-26 MED ORDER — ONDANSETRON 4 MG PO TBDP: 4.0000 mg | ORAL_TABLET | Freq: Four times a day (QID) | ORAL | Status: DC | PRN

## 2015-09-26 MED ORDER — MORPHINE SULFATE 1 MG/ML IV SOLN
INTRAVENOUS | Status: AC
  Filled 2015-09-26: qty 25

## 2015-09-26 MED ORDER — OXYCODONE HCL 5 MG PO TABS: 5.0000 mg | ORAL_TABLET | Freq: Once | ORAL | Status: DC | PRN

## 2015-09-26 MED ORDER — ACETAMINOPHEN 325 MG PO TABS: 650.0000 mg | ORAL_TABLET | Freq: Four times a day (QID) | ORAL | Status: DC | PRN

## 2015-09-26 MED ORDER — INSULIN ASPART 100 UNIT/ML ~~LOC~~ SOLN
0.0000 [IU] | Freq: Three times a day (TID) | SUBCUTANEOUS | Status: DC
  Administered 2015-09-26: 7 [IU] via SUBCUTANEOUS
  Administered 2015-09-26: 11 [IU] via SUBCUTANEOUS
  Administered 2015-09-26: 7 [IU] via SUBCUTANEOUS
  Administered 2015-09-27: 3 [IU] via SUBCUTANEOUS
  Administered 2015-09-27 – 2015-09-28 (×2): 4 [IU] via SUBCUTANEOUS
  Administered 2015-09-28: 3 [IU] via SUBCUTANEOUS
  Administered 2015-09-28 – 2015-09-29 (×4): 4 [IU] via SUBCUTANEOUS
  Administered 2015-09-30: 7 [IU] via SUBCUTANEOUS
  Administered 2015-09-30 – 2015-10-01 (×4): 4 [IU] via SUBCUTANEOUS

## 2015-09-26 MED ORDER — ONDANSETRON HCL 4 MG/2ML IJ SOLN: 4.0000 mg | Freq: Four times a day (QID) | INTRAMUSCULAR | Status: DC | PRN

## 2015-09-26 MED ORDER — PHENYLEPHRINE HCL 10 MG/ML IJ SOLN
INTRAMUSCULAR | Status: DC | PRN
  Administered 2015-09-26 (×2): 80 ug via INTRAVENOUS

## 2015-09-26 MED ORDER — ATORVASTATIN CALCIUM 40 MG PO TABS
40.0000 mg | ORAL_TABLET | Freq: Every day | ORAL | Status: DC
  Administered 2015-09-26 – 2015-10-02 (×7): 40 mg via ORAL
  Filled 2015-09-26 (×7): qty 1

## 2015-09-26 MED ORDER — MORPHINE SULFATE (PF) 2 MG/ML IV SOLN: 1.0000 mg | INTRAVENOUS | Status: DC | PRN

## 2015-09-26 MED ORDER — DIPHENHYDRAMINE HCL 12.5 MG/5ML PO ELIX: 12.5000 mg | ORAL_SOLUTION | Freq: Four times a day (QID) | ORAL | Status: DC | PRN

## 2015-09-26 MED ORDER — PHENYLEPHRINE HCL 10 MG/ML IJ SOLN
10.0000 mg | INTRAMUSCULAR | Status: DC | PRN
  Administered 2015-09-26: 100 ug/min via INTRAVENOUS

## 2015-09-26 MED ORDER — FENTANYL CITRATE (PF) 100 MCG/2ML IJ SOLN
INTRAMUSCULAR | Status: DC | PRN
  Administered 2015-09-25: 100 ug via INTRAVENOUS
  Administered 2015-09-25 – 2015-09-26 (×5): 50 ug via INTRAVENOUS

## 2015-09-26 MED ORDER — NALOXONE HCL 0.4 MG/ML IJ SOLN: 0.4000 mg | INTRAMUSCULAR | Status: DC | PRN

## 2015-09-26 MED ORDER — ROCURONIUM BROMIDE 50 MG/5ML IV SOLN
INTRAVENOUS | Status: AC
  Filled 2015-09-26: qty 1

## 2015-09-26 MED ORDER — LACTATED RINGERS IV SOLN
INTRAVENOUS | Status: DC | PRN
Start: 2015-09-25 — End: 2015-09-26
  Administered 2015-09-25 – 2015-09-26 (×2): via INTRAVENOUS

## 2015-09-26 MED ORDER — PROPOFOL 10 MG/ML IV BOLUS
INTRAVENOUS | Status: DC | PRN
  Administered 2015-09-26: 200 mg via INTRAVENOUS

## 2015-09-26 MED ORDER — CIPROFLOXACIN IN D5W 400 MG/200ML IV SOLN
400.0000 mg | Freq: Two times a day (BID) | INTRAVENOUS | Status: DC
  Administered 2015-09-26 – 2015-09-30 (×10): 400 mg via INTRAVENOUS
  Filled 2015-09-26 (×13): qty 200

## 2015-09-26 MED ORDER — HYDROMORPHONE HCL 1 MG/ML IJ SOLN: 0.2500 mg | INTRAMUSCULAR | Status: DC | PRN

## 2015-09-26 MED ORDER — MORPHINE SULFATE 1 MG/ML IV SOLN
INTRAVENOUS | Status: DC
  Administered 2015-09-26: 02:00:00 via INTRAVENOUS
  Administered 2015-09-26: 10.5 mg via INTRAVENOUS
  Administered 2015-09-26 – 2015-09-27 (×2): 4.5 mg via INTRAVENOUS
  Administered 2015-09-27: 9 mg via INTRAVENOUS
  Administered 2015-09-27: 08:00:00 via INTRAVENOUS
  Administered 2015-09-27: 7.5 mg via INTRAVENOUS
  Administered 2015-09-27: 02:00:00 via INTRAVENOUS
  Administered 2015-09-28: 1.5 mg via INTRAVENOUS
  Administered 2015-09-28: 13.3 mg via INTRAVENOUS
  Administered 2015-09-28: 7.5 mg via INTRAVENOUS
  Filled 2015-09-26 (×5): qty 25

## 2015-09-26 MED ORDER — SODIUM CHLORIDE 0.9 % IV SOLN
INTRAVENOUS | Status: DC | PRN
  Administered 2015-09-26 (×2): via INTRAVENOUS

## 2015-09-26 MED ORDER — LIDOCAINE HCL (CARDIAC) 20 MG/ML IV SOLN
INTRAVENOUS | Status: DC | PRN
  Administered 2015-09-26: 80 mg via INTRAVENOUS

## 2015-09-26 MED ORDER — LOSARTAN POTASSIUM 50 MG PO TABS: 50.0000 mg | ORAL_TABLET | Freq: Every day | ORAL | Status: DC

## 2015-09-26 MED ORDER — ACETAMINOPHEN 650 MG RE SUPP: 650.0000 mg | Freq: Four times a day (QID) | RECTAL | Status: DC | PRN

## 2015-09-26 MED ORDER — SODIUM CHLORIDE 0.9 % IJ SOLN: 9.0000 mL | INTRAMUSCULAR | Status: DC | PRN

## 2015-09-26 MED ORDER — ACETAMINOPHEN 325 MG PO TABS: 325.0000 mg | ORAL_TABLET | ORAL | Status: DC | PRN

## 2015-09-26 MED ORDER — OXYCODONE HCL 5 MG/5ML PO SOLN: 5.0000 mg | Freq: Once | ORAL | Status: DC | PRN

## 2015-09-26 MED ORDER — METRONIDAZOLE IN NACL 5-0.79 MG/ML-% IV SOLN
500.0000 mg | Freq: Three times a day (TID) | INTRAVENOUS | Status: DC
  Administered 2015-09-26 – 2015-10-01 (×16): 500 mg via INTRAVENOUS
  Filled 2015-09-26 (×17): qty 100

## 2015-09-26 MED ORDER — HEPARIN SODIUM (PORCINE) 5000 UNIT/ML IJ SOLN: 5000.0000 [IU] | Freq: Three times a day (TID) | INTRAMUSCULAR | Status: DC

## 2015-09-26 MED ORDER — SUGAMMADEX SODIUM 200 MG/2ML IV SOLN
INTRAVENOUS | Status: AC
  Filled 2015-09-26: qty 2

## 2015-09-26 MED ORDER — NEOSTIGMINE METHYLSULFATE 10 MG/10ML IV SOLN
INTRAVENOUS | Status: AC
  Filled 2015-09-26: qty 2

## 2015-09-26 MED ORDER — PROMETHAZINE HCL 25 MG/ML IJ SOLN
12.5000 mg | Freq: Four times a day (QID) | INTRAMUSCULAR | Status: DC | PRN
  Administered 2015-09-26 – 2015-09-27 (×2): 12.5 mg via INTRAVENOUS
  Filled 2015-09-26 (×2): qty 1

## 2015-09-26 MED ORDER — SUCCINYLCHOLINE CHLORIDE 20 MG/ML IJ SOLN
INTRAMUSCULAR | Status: DC | PRN
  Administered 2015-09-26: 80 mg via INTRAVENOUS

## 2015-09-26 MED ORDER — ATENOLOL 25 MG PO TABS
25.0000 mg | ORAL_TABLET | Freq: Every morning | ORAL | Status: DC
  Administered 2015-09-26 – 2015-10-02 (×7): 25 mg via ORAL
  Filled 2015-09-26 (×7): qty 1

## 2015-09-26 MED ORDER — DIPHENHYDRAMINE HCL 50 MG/ML IJ SOLN
12.5000 mg | Freq: Four times a day (QID) | INTRAMUSCULAR | Status: DC | PRN
Start: 2015-09-26 — End: 2015-09-26

## 2015-09-26 MED ORDER — INSULIN GLARGINE 100 UNIT/ML ~~LOC~~ SOLN
35.0000 [IU] | Freq: Every morning | SUBCUTANEOUS | Status: DC
  Administered 2015-09-26 – 2015-10-01 (×6): 35 [IU] via SUBCUTANEOUS
  Filled 2015-09-26 (×11): qty 0.35

## 2015-09-26 MED ORDER — KCL IN DEXTROSE-NACL 20-5-0.45 MEQ/L-%-% IV SOLN
INTRAVENOUS | Status: AC
  Administered 2015-09-26: 1000 mL
  Filled 2015-09-26: qty 1000

## 2015-09-26 NOTE — Anesthesia Procedure Notes (Signed)
Procedure Name: Intubation Date/Time: 09/26/2015 12:04 AM Performed by: Hollie Salk Z Pre-anesthesia Checklist: Patient identified, Timeout performed, Emergency Drugs available, Suction available and Patient being monitored Patient Re-evaluated:Patient Re-evaluated prior to inductionOxygen Delivery Method: Circle system utilized Preoxygenation: Pre-oxygenation with 100% oxygen Intubation Type: IV induction, Rapid sequence and Cricoid Pressure applied Laryngoscope Size: Glidescope Grade View: Grade I Tube type: Oral Tube size: 8.0 mm Number of attempts: 1 Airway Equipment and Method: Stylet Placement Confirmation: ETT inserted through vocal cords under direct vision,  breath sounds checked- equal and bilateral and positive ETCO2 Secured at: 23 cm Tube secured with: Tape Dental Injury: Teeth and Oropharynx as per pre-operative assessment

## 2015-09-26 NOTE — Progress Notes (Signed)
Received patient and report from [facility], PACU RN. Patient AOx3, VS stable, pain at 2/10, O2Sat=96% at 3L/min via [place].  Patient and wife oriented to room, bed controls and call light. Patient resting on bed comfortably with wife at bedside.

## 2015-09-26 NOTE — Care Management Note (Signed)
Case Management Note  Patient Details  Name: Maurice Perkins MRN: [id number] Date of Birth: Jun 02, 1946  Subjective/Objective:                    Action/Plan: Initial UR completed  Expected Discharge Date:                  Expected Discharge Plan:     In-House Referral:     Discharge planning Services     Post Acute Care Choice:    Choice offered to:     DME Arranged:    DME Agency:     HH Arranged:    [place] Agency:     Status of Service:  In process, will continue to follow  Medicare Important Message Given:    Date Medicare IM Given:    Medicare IM give by:    Date Additional Medicare IM Given:    Additional Medicare Important Message give by:     If discussed at [place] of Stay Meetings, dates discussed:    Additional Comments:  Marilu Favre, RN 09/26/2015, 8:10 AM

## 2015-09-26 NOTE — Progress Notes (Signed)
1 Day Post-Op  Subjective: No complaints  Objective: Vital signs in last 24 hours: Temp:  [97.7 F (36.5 C)-99.6 F (37.6 C)] 99 F (37.2 C) (10/13 0505) Pulse Rate:  [87-120] 92 (10/13 0505) Resp:  [14-20] 17 (10/13 0505) BP: (110-148)/(57-93) 139/72 mmHg (10/13 0505) SpO2:  [90 %-98 %] 95 % (10/13 0505) Weight:  [110.542 kg (243 lb 11.2 oz)] 110.542 kg (243 lb 11.2 oz) (10/13 0224)    Intake/Output from previous day: 10/12 0701 - 10/13 0700 In: 3863.5 [I.V.:3863.5] Out: 680 [Urine:605; Blood:75] Intake/Output this shift:    GI: approp tender dressing in place no bs  Lab Results:   Recent Labs  09/25/15 1741 09/26/15 0432  WBC 12.8* 7.9  HGB 15.3 12.8*  HCT 44.2 38.9*  PLT 226 217   BMET  Recent Labs  09/25/15 1741 09/26/15 0432  NA 133* 137  K 3.6 3.8  CL 93* 97*  CO2 26 27  GLUCOSE 255* 277*  BUN 23* 20  CREATININE 1.79* 1.59*  CALCIUM 8.9 7.9*   PT/INR  Recent Labs  09/25/15 2334  LABPROT 14.1  INR 1.07   ABG No results for input(s): PHART, HCO3 in the last 72 hours.  Invalid input(s): PCO2, PO2  Studies/Results: Dg Chest 2 View  09/25/2015  CLINICAL DATA:  69 year old male - preoperative respiratory examination for bowel perforation surgery. EXAM: CHEST  2 VIEW COMPARISON:  09/25/2015 CT FINDINGS: This is a low volume film with mild bibasilar atelectasis. The cardiomediastinal silhouette is unremarkable. There is no evidence of focal airspace disease, pulmonary edema, suspicious pulmonary nodule/mass, pleural effusion, or pneumothorax. No acute bony abnormalities are identified. Pneumoperitoneum underlying the right hemidiaphragm is again identified. IMPRESSION: Low volume film with mild bibasilar atelectasis. Pneumoperitoneum again identified. Electronically Signed   By: Margarette Canada M.D.   On: 09/25/2015 23:06   Ct Abdomen Pelvis W Contrast  09/25/2015  CLINICAL DATA:  Left lower quadrant pain and fever. History of diverticulosis. Symptoms  began yesterday. Reduced contrast dose given due to GFR 36. EXAM: CT ABDOMEN AND PELVIS WITH CONTRAST TECHNIQUE: Multidetector CT imaging of the abdomen and pelvis was performed using the standard protocol following bolus administration of intravenous contrast. CONTRAST:  43mL OMNIPAQUE IOHEXOL 300 MG/ML  SOLN COMPARISON:  MRI abdomen 08/26/2015. CT abdomen and pelvis 05/04/2014. FINDINGS: Mild dependent changes in the lung bases. Small esophageal hiatal hernia. Free intra-abdominal air demonstrated throughout the upper abdomen with multiple small gas collections present. In the absence of recent surgery, this appearance is suspicious for bowel perforation. No free fluid or free contrast material is demonstrated. There is focal mesenteric stranding in the left mid abdomen anteriorly, associated with an abnormal small bowel segment. The small bowel at this location demonstrates mild wall thickening. Gas is present around the area. This is likely the site of perforation. This could be due to inflammatory bowel disease or possibly of small-bowel diverticulitis. Diffuse fatty infiltration of the liver. Small low-attenuation lesion with peripheral enhancement in the medial segment left lobe of liver measuring about 2 cm diameter. This is unchanged since previous study and probably represents a hemangioma. Gallbladder, spleen, adrenal glands, abdominal aorta, inferior vena cava, and retroperitoneal lymph nodes are unremarkable. Circumscribed low-attenuation lesions in both kidneys, largest on the right measuring 3.2 cm diameter, consistent with cysts. No hydronephrosis in either kidney. Renal nephrograms are symmetrical. Septated appearing cystic lesion in the uncinate process of the pancreas measuring 3.1 cm diameter. This is unchanged since previous study. 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 Pelvis: Prostate gland is enlarged. Bladder wall is not thickened. Diverticulosis of sigmoid colon without  evidence of diverticulitis. No free or loculated pelvic fluid collections. No pelvic mass or lymphadenopathy. Appendix is normal. Degenerative changes throughout the spine. No destructive bone lesions. IMPRESSION: Diffuse free intra-abdominal air. Focal area of inflammation in a left anterior mid abdominal small bowel loop with associated fat stranding. This is likely the cause of perforation. Consider inflammatory bowel disease versus small bowel diverticulitis. Unchanged appearance of hemangioma in the liver and cystic lesion in the uncinate process of the pancreas. Diffuse fatty infiltration of the liver. These results were called by telephone at the time of interpretation on 09/25/2015 at 9:58 pm to Dr. Zenovia Jarred , who verbally acknowledged these results. Electronically Signed   By: Lucienne Capers M.D.   On: 09/25/2015 22:01    Anti-infectives: Anti-infectives    Start     Dose/Rate Route Frequency Ordered Stop   09/26/15 1000  ciprofloxacin (CIPRO) IVPB 400 mg     400 mg 200 mL/hr over 60 Minutes Intravenous Every 12 hours 09/26/15 0233     09/26/15 0800  metroNIDAZOLE (FLAGYL) IVPB 500 mg     500 mg 100 mL/hr over 60 Minutes Intravenous Every 8 hours 09/26/15 0233     09/25/15 2345  ciprofloxacin (CIPRO) IVPB 400 mg     400 mg 200 mL/hr over 60 Minutes Intravenous  Once 09/25/15 2337 09/25/15 2359   09/25/15 2345  metroNIDAZOLE (FLAGYL) IVPB 500 mg     500 mg 100 mL/hr over 60 Minutes Intravenous  Once 09/25/15 2337 09/26/15 0005      Assessment/Plan: POD 1 sbr  1. Cont pca, will give 24 hours iv tylenol 2. pulm toilet, oob, cont beta blocker 3. Sips/ice chips, await ileus resolve 4. Start dressing changes tomorrow 5. Dc foley tomorrow 6. lovenox scds 7. Plan five days cipro/flagyl D1   [facility] 09/26/2015

## 2015-09-26 NOTE — Anesthesia Postprocedure Evaluation (Signed)
  Anesthesia Post-op Note  Patient: Maurice Perkins  Procedure(s) Performed: Procedure(s): EXPLORATORY LAPAROTOMY small bowel resection (N/A)  Patient Location: PACU  Anesthesia Type:General  Level of Consciousness: awake  Airway and Oxygen Therapy: Patient Spontanous Breathing and Patient connected to nasal cannula oxygen  Post-op Pain: mild  Post-op Assessment: Post-op Vital signs reviewed, Patient's Cardiovascular Status Stable, Respiratory Function Stable, Patent Airway, No signs of Nausea or vomiting and Pain level controlled              Post-op Vital Signs: Reviewed and stable  Last Vitals:  Filed Vitals:   09/26/15 0936  BP: 126/67  Pulse: 95  Temp: 37.3 C  Resp: 19    Complications: No apparent anesthesia complications

## 2015-09-26 NOTE — Transfer of Care (Signed)
Immediate Anesthesia Transfer of Care Note  Patient: Maurice Perkins  Procedure(s) Performed: Procedure(s): EXPLORATORY LAPAROTOMY small bowel resection (N/A)  Patient Location: PACU  Anesthesia Type:General  Level of Consciousness: awake and patient cooperative  Airway & Oxygen Therapy: Patient Spontanous Breathing and Patient connected to face mask oxygen  Post-op Assessment: Report given to RN and Post -op Vital signs reviewed and stable  Post vital signs: Reviewed and stable  Last Vitals:  Filed Vitals:   09/26/15 0124  BP: 148/62  Pulse: 103  Temp: 36.5 C  Resp: 20    Complications: No apparent anesthesia complications

## 2015-09-26 NOTE — Anesthesia Preprocedure Evaluation (Signed)
Anesthesia Evaluation  Patient identified by MRN, date of birth, ID band Patient awake    Reviewed: Allergy & Precautions, NPO status , Patient's Chart, lab work & pertinent test results  History of Anesthesia Complications Negative for: history of anesthetic complications  Airway Mallampati: III  TM Distance: >3 FB Neck ROM: Full    Dental  (+) Teeth Intact, Partial Upper   Pulmonary neg shortness of breath, neg sleep apnea, neg COPD, neg recent URI, former smoker,    breath sounds clear to auscultation       Cardiovascular hypertension, Pt. on medications and Pt. on home beta blockers (-) angina+ CAD  (-) Past MI and (-) CHF  Rhythm:Regular Rate:Tachycardia     Neuro/Psych negative neurological ROS  negative psych ROS   GI/Hepatic Neg liver ROS, Perforated viscus    Endo/Other  diabetes, Type 2, Insulin Dependent  Renal/GU Renal InsufficiencyRenal disease     Musculoskeletal   Abdominal   Peds  Hematology   Anesthesia Other Findings   Reproductive/Obstetrics                             Anesthesia Physical Anesthesia Plan  ASA: III and emergent  Anesthesia Plan: General   Post-op Pain Management:    Induction: Intravenous  Airway Management Planned: Oral ETT  Additional Equipment: None  Intra-op Plan:   Post-operative Plan: Extubation in OR and Possible Post-op intubation/ventilation  Informed Consent: I have reviewed the patients History and Physical, chart, labs and discussed the procedure including the risks, benefits and alternatives for the proposed anesthesia with the patient or authorized representative who has indicated his/her understanding and acceptance.   Dental advisory given  Plan Discussed with: CRNA, Anesthesiologist and Surgeon  Anesthesia Plan Comments:         Anesthesia Quick Evaluation

## 2015-09-27 ENCOUNTER — Inpatient Hospital Stay (HOSPITAL_COMMUNITY): Payer: Commercial Managed Care - HMO

## 2015-09-27 LAB — BASIC METABOLIC PANEL
Anion gap: 9 (ref 5–15)
BUN: 16 mg/dL (ref 6–20)
CALCIUM: 8.1 mg/dL — AB (ref 8.9–10.3)
CHLORIDE: 98 mmol/L — AB (ref 101–111)
CO2: 32 mmol/L (ref 22–32)
CREATININE: 1.77 mg/dL — AB (ref 0.61–1.24)
GFR calc non Af Amer: 37 mL/min — ABNORMAL LOW (ref >60)
GFR, EST AFRICAN AMERICAN: 43 mL/min — AB (ref >60)
Glucose, Bld: 165 mg/dL — ABNORMAL HIGH (ref 65–99)
Potassium: 4.2 mmol/L (ref 3.5–5.1)
SODIUM: 139 mmol/L (ref 135–145)

## 2015-09-27 LAB — CBC
HCT: 37 % — ABNORMAL LOW (ref 39.0–52.0)
HEMOGLOBIN: 12 g/dL — AB (ref 13.0–17.0)
MCH: 29.6 pg (ref 26.0–34.0)
MCHC: 32.4 g/dL (ref 30.0–36.0)
MCV: 91.1 fL (ref 78.0–100.0)
Platelets: 184 10*3/uL (ref 150–400)
RBC: 4.06 MIL/uL — ABNORMAL LOW (ref 4.22–5.81)
RDW: 14.9 % (ref 11.5–15.5)
WBC: 8 10*3/uL (ref 4.0–10.5)

## 2015-09-27 LAB — GLUCOSE, CAPILLARY
Glucose-Capillary: 137 mg/dL — ABNORMAL HIGH (ref 65–99)
Glucose-Capillary: 112 mg/dL — ABNORMAL HIGH (ref 65–99)

## 2015-09-27 NOTE — Progress Notes (Signed)
2 Days Post-Op  Subjective: States passing flatus but having nausea esp when standing  Objective: Vital signs in last 24 hours: Temp:  [98.5 F (36.9 C)-99.3 F (37.4 C)] 99.3 F (37.4 C) (10/14 0500) Pulse Rate:  [75-95] 75 (10/14 0500) Resp:  [11-20] 19 (10/14 0500) BP: (105-126)/(59-82) 113/60 mmHg (10/14 0500) SpO2:  [94 %-99 %] 98 % (10/14 0500) Last BM Date: 09/25/15  Intake/Output from previous day: 10/13 0701 - 10/14 0700 In: 1306 [I.V.:1306] Out: 1800 [Urine:1800] Intake/Output this shift: Total I/O In: -  Out: 100 [Urine:100]  General appearance: no distress Resp: clear to auscultation bilaterally Cardio: regular rate and rhythm GI: approp tender no bs  Lab Results:   Recent Labs  09/26/15 0432 09/27/15 0348  WBC 7.9 8.0  HGB 12.8* 12.0*  HCT 38.9* 37.0*  PLT 217 184   BMET  Recent Labs  09/26/15 0432 09/27/15 0348  NA 137 139  K 3.8 4.2  CL 97* 98*  CO2 27 32  GLUCOSE 277* 165*  BUN 20 16  CREATININE 1.59* 1.77*  CALCIUM 7.9* 8.1*   PT/INR  Recent Labs  09/25/15 2334  LABPROT 14.1  INR 1.07   ABG No results for input(s): PHART, HCO3 in the last 72 hours.  Invalid input(s): PCO2, PO2  Studies/Results: Dg Chest 2 View  09/25/2015  CLINICAL DATA:  69 year old male - preoperative respiratory examination for bowel perforation surgery. EXAM: CHEST  2 VIEW COMPARISON:  09/25/2015 CT FINDINGS: This is a low volume film with mild bibasilar atelectasis. The cardiomediastinal silhouette is unremarkable. There is no evidence of focal airspace disease, pulmonary edema, suspicious pulmonary nodule/mass, pleural effusion, or pneumothorax. No acute bony abnormalities are identified. Pneumoperitoneum underlying the right hemidiaphragm is again identified. IMPRESSION: Low volume film with mild bibasilar atelectasis. Pneumoperitoneum again identified. Electronically Signed   By: Margarette Canada M.D.   On: 09/25/2015 23:06   Ct Abdomen Pelvis W  Contrast  09/25/2015  CLINICAL DATA:  Left lower quadrant pain and fever. History of diverticulosis. Symptoms began yesterday. Reduced contrast dose given due to GFR 36. EXAM: CT ABDOMEN AND PELVIS WITH CONTRAST TECHNIQUE: Multidetector CT imaging of the abdomen and pelvis was performed using the standard protocol following bolus administration of intravenous contrast. CONTRAST:  32mL OMNIPAQUE IOHEXOL 300 MG/ML  SOLN COMPARISON:  MRI abdomen 08/26/2015. CT abdomen and pelvis 05/04/2014. FINDINGS: Mild dependent changes in the lung bases. Small esophageal hiatal hernia. Free intra-abdominal air demonstrated throughout the upper abdomen with multiple small gas collections present. In the absence of recent surgery, this appearance is suspicious for bowel perforation. No free fluid or free contrast material is demonstrated. There is focal mesenteric stranding in the left mid abdomen anteriorly, associated with an abnormal small bowel segment. The small bowel at this location demonstrates mild wall thickening. Gas is present around the area. This is likely the site of perforation. This could be due to inflammatory bowel disease or possibly of small-bowel diverticulitis. Diffuse fatty infiltration of the liver. Small low-attenuation lesion with peripheral enhancement in the medial segment left lobe of liver measuring about 2 cm diameter. This is unchanged since previous study and probably represents a hemangioma. Gallbladder, spleen, adrenal glands, abdominal aorta, inferior vena cava, and retroperitoneal lymph nodes are unremarkable. Circumscribed low-attenuation lesions in both kidneys, largest on the right measuring 3.2 cm diameter, consistent with cysts. No hydronephrosis in either kidney. Renal nephrograms are symmetrical. Septated appearing cystic lesion in the uncinate process of the pancreas measuring 3.1 cm diameter. This  is unchanged since previous study. This is likely to represent cystic pancreatic  neoplasm. Pelvis: Prostate gland is enlarged. Bladder wall is not thickened. Diverticulosis of sigmoid colon without evidence of diverticulitis. No free or loculated pelvic fluid collections. No pelvic mass or lymphadenopathy. Appendix is normal. Degenerative changes throughout the spine. No destructive bone lesions. IMPRESSION: Diffuse free intra-abdominal air. Focal area of inflammation in a left anterior mid abdominal small bowel loop with associated fat stranding. This is likely the cause of perforation. Consider inflammatory bowel disease versus small bowel diverticulitis. Unchanged appearance of hemangioma in the liver and cystic lesion in the uncinate process of the pancreas. Diffuse fatty infiltration of the liver. These results were called by telephone at the time of interpretation on 09/25/2015 at 9:58 pm to Dr. Zenovia Jarred , who verbally acknowledged these results. Electronically Signed   By: Lucienne Capers M.D.   On: 09/25/2015 22:01    Anti-infectives: Anti-infectives    Start     Dose/Rate Route Frequency Ordered Stop   09/26/15 1000  ciprofloxacin (CIPRO) IVPB 400 mg     400 mg 200 mL/hr over 60 Minutes Intravenous Every 12 hours 09/26/15 0233     09/26/15 0800  metroNIDAZOLE (FLAGYL) IVPB 500 mg     500 mg 100 mL/hr over 60 Minutes Intravenous Every 8 hours 09/26/15 0233     09/25/15 2345  ciprofloxacin (CIPRO) IVPB 400 mg     400 mg 200 mL/hr over 60 Minutes Intravenous  Once 09/25/15 2337 09/25/15 2359   09/25/15 2345  metroNIDAZOLE (FLAGYL) IVPB 500 mg     500 mg 100 mL/hr over 60 Minutes Intravenous  Once 09/25/15 2337 09/26/15 0005      Assessment/Plan: POD 2 sbr  1. Cont pca until taking po 2. pulm toilet, oob, cont beta blocker 3. Sips/ice chips, await ileus resolve, will check xray today as he is having nausea although says he is passing flatus 4. Start dressing changes today 5. Dc foley, check bmet in am again, cr elevated, I have stopped arb written for  postop 6. lovenox scds 7. Plan five days cipro/flagyl D2  [facility] 09/27/2015

## 2015-09-28 DIAGNOSIS — K5712 Diverticulitis of small intestine without perforation or abscess without bleeding: Secondary | ICD-10-CM

## 2015-09-28 LAB — BASIC METABOLIC PANEL
Anion gap: 11 (ref 5–15)
BUN: 14 mg/dL (ref 6–20)
CALCIUM: 7.5 mg/dL — AB (ref 8.9–10.3)
CO2: 25 mmol/L (ref 22–32)
CREATININE: 1.65 mg/dL — AB (ref 0.61–1.24)
Chloride: 96 mmol/L — ABNORMAL LOW (ref 101–111)
GFR calc Af Amer: 47 mL/min — ABNORMAL LOW (ref >60)
GFR, EST NON AFRICAN AMERICAN: 40 mL/min — AB (ref >60)
Glucose, Bld: 125 mg/dL — ABNORMAL HIGH (ref 65–99)
POTASSIUM: 3.3 mmol/L — AB (ref 3.5–5.1)
SODIUM: 132 mmol/L — AB (ref 135–145)

## 2015-09-28 LAB — GLUCOSE, CAPILLARY
GLUCOSE-CAPILLARY: 173 mg/dL — AB (ref 65–99)
GLUCOSE-CAPILLARY: 129 mg/dL — AB (ref 65–99)
GLUCOSE-CAPILLARY: 185 mg/dL — AB (ref 65–99)
GLUCOSE-CAPILLARY: 154 mg/dL — AB (ref 65–99)
Glucose-Capillary: 120 mg/dL — ABNORMAL HIGH (ref 65–99)
Glucose-Capillary: 149 mg/dL — ABNORMAL HIGH (ref 65–99)

## 2015-09-28 MED ORDER — MAGIC MOUTHWASH: 15.0000 mL | Freq: Four times a day (QID) | ORAL | Status: DC | PRN

## 2015-09-28 MED ORDER — LIP MEDEX EX OINT: 1.0000 "application " | TOPICAL_OINTMENT | Freq: Two times a day (BID) | CUTANEOUS | Status: DC

## 2015-09-28 MED ORDER — PROMETHAZINE HCL 25 MG/ML IJ SOLN
6.2500 mg | INTRAMUSCULAR | Status: DC | PRN
  Administered 2015-09-29 – 2015-09-30 (×2): 12.5 mg via INTRAVENOUS
  Filled 2015-09-28 (×2): qty 1

## 2015-09-28 MED ORDER — SACCHAROMYCES BOULARDII 250 MG PO CAPS
250.0000 mg | ORAL_CAPSULE | Freq: Two times a day (BID) | ORAL | Status: DC
  Administered 2015-09-28 – 2015-10-02 (×9): 250 mg via ORAL
  Filled 2015-09-28 (×9): qty 1

## 2015-09-28 MED ORDER — LACTATED RINGERS IV SOLN
INTRAVENOUS | Status: DC
  Administered 2015-09-28 – 2015-09-29 (×2): via INTRAVENOUS

## 2015-09-28 MED ORDER — BLISTEX MEDICATED EX OINT
TOPICAL_OINTMENT | Freq: Two times a day (BID) | CUTANEOUS | Status: DC
  Administered 2015-09-28 – 2015-10-02 (×8): via TOPICAL
  Filled 2015-09-28 (×2): qty 10

## 2015-09-28 MED ORDER — METHOCARBAMOL 1000 MG/10ML IJ SOLN
1000.0000 mg | Freq: Four times a day (QID) | INTRAVENOUS | Status: DC | PRN
  Filled 2015-09-28: qty 10

## 2015-09-28 MED ORDER — ALUM & MAG HYDROXIDE-SIMETH 200-200-20 MG/5ML PO SUSP: 30.0000 mL | Freq: Four times a day (QID) | ORAL | Status: DC | PRN

## 2015-09-28 MED ORDER — MENTHOL 3 MG MT LOZG: 1.0000 | LOZENGE | OROMUCOSAL | Status: DC | PRN

## 2015-09-28 MED ORDER — ACETAMINOPHEN 500 MG PO TABS
1000.0000 mg | ORAL_TABLET | Freq: Three times a day (TID) | ORAL | Status: DC
  Administered 2015-09-28 – 2015-09-30 (×7): 1000 mg via ORAL
  Filled 2015-09-28 (×7): qty 2

## 2015-09-28 MED ORDER — MORPHINE SULFATE (PF) 2 MG/ML IV SOLN
2.0000 mg | INTRAVENOUS | Status: DC | PRN
  Administered 2015-09-28 – 2015-09-29 (×4): 2 mg via INTRAVENOUS
  Filled 2015-09-28 (×5): qty 1

## 2015-09-28 MED ORDER — PHENOL 1.4 % MT LIQD: 2.0000 | OROMUCOSAL | Status: DC | PRN

## 2015-09-28 NOTE — Progress Notes (Signed)
Patient ID: Maurice Perkins, male   DOB: 1946-02-25, 69 y.o.   MRN: [id number] 3 Days Post-Op  Subjective: Pt feels better today.  No nausea.  Passing flatus.  Ambulating well  Objective: Vital signs in last 24 hours: Temp:  [98 F (36.7 C)-99.3 F (37.4 C)] 98.7 F (37.1 C) (10/15 0455) Pulse Rate:  [64-81] 68 (10/15 0455) Resp:  [12-20] 20 (10/15 0846) BP: (101-120)/(56-80) 116/67 mmHg (10/15 0455) SpO2:  [92 %-96 %] 95 % (10/15 0846) Last BM Date: 09/25/15  Intake/Output from previous day: 10/14 0701 - 10/15 0700 In: 2137.9 [P.O.:120; I.V.:2017.9] Out: 8502 [Urine:1825] Intake/Output this shift: Total I/O In: 50 [P.O.:50] Out: 1150 [Urine:1150]  PE: Abd: soft, appropriately tender, wound is clean and packed, +BS  Lab Results:   Recent Labs  09/26/15 0432 09/27/15 0348  WBC 7.9 8.0  HGB 12.8* 12.0*  HCT 38.9* 37.0*  PLT 217 184   BMET  Recent Labs  09/27/15 0348 09/28/15 0449  NA 139 132*  K 4.2 3.3*  CL 98* 96*  CO2 32 25  GLUCOSE 165* 125*  BUN 16 14  CREATININE 1.77* 1.65*  CALCIUM 8.1* 7.5*   PT/INR  Recent Labs  09/25/15 2334  LABPROT 14.1  INR 1.07   CMP     Component Value Date/Time   NA 132* 09/28/2015 0449   K 3.3* 09/28/2015 0449   CL 96* 09/28/2015 0449   CO2 25 09/28/2015 0449   GLUCOSE 125* 09/28/2015 0449   BUN 14 09/28/2015 0449   CREATININE 1.65* 09/28/2015 0449   CALCIUM 7.5* 09/28/2015 0449   PROT 7.1 09/25/2015 1741   ALBUMIN 3.9 09/25/2015 1741   AST 34 09/25/2015 1741   ALT 37 09/25/2015 1741   ALKPHOS 39 09/25/2015 1741   BILITOT 1.6* 09/25/2015 1741   GFRNONAA 40* 09/28/2015 0449   GFRAA 47* 09/28/2015 0449   Lipase     Component Value Date/Time   LIPASE 14* 09/25/2015 1741       Studies/Results: Dg Abd Portable 1v  09/27/2015  CLINICAL DATA:  Ileus falling gastro intestinal surgery. EXAM: PORTABLE ABDOMEN - 1 VIEW COMPARISON:  CT scan of September 25, 2015. FINDINGS: Mildly dilated small bowel loops  are noted most consistent with postoperative ileus. No colonic dilatation is noted. Air is noted in the rectum. No radio-opaque calculi or other significant radiographic abnormality are seen. IMPRESSION: Mildly dilated small bowel loops are noted most consistent with postoperative ileus. Electronically Signed   By: Marijo Conception, M.D.   On: 09/27/2015 10:01    Anti-infectives: Anti-infectives    Start     Dose/Rate Route Frequency Ordered Stop   09/26/15 1000  ciprofloxacin (CIPRO) IVPB 400 mg     400 mg 200 mL/hr over 60 Minutes Intravenous Every 12 hours 09/26/15 0233     09/26/15 0800  metroNIDAZOLE (FLAGYL) IVPB 500 mg     500 mg 100 mL/hr over 60 Minutes Intravenous Every 8 hours 09/26/15 0233     09/25/15 2345  ciprofloxacin (CIPRO) IVPB 400 mg     400 mg 200 mL/hr over 60 Minutes Intravenous  Once 09/25/15 2337 09/25/15 2359   09/25/15 2345  metroNIDAZOLE (FLAGYL) IVPB 500 mg     500 mg 100 mL/hr over 60 Minutes Intravenous  Once 09/25/15 2337 09/26/15 0005       Assessment/Plan  POD 3 sbr  1. Cont pca until taking po 2. pulm toilet, oob, cont beta blocker 3. Try clear liquids today 4. Cont  dressing changes 5. check bmet in am again, cr elevated, stopped arb written for postop, fluids as 125cc/hr  Cont for now and follow cr, but this is trending down 6. lovenox scds 7. Plan five days cipro/flagyl D3   LOS: 2 days    Daemian Gahm E 09/28/2015, 11:25 AM Pager: [phone]

## 2015-09-29 LAB — GLUCOSE, CAPILLARY
GLUCOSE-CAPILLARY: 152 mg/dL — AB (ref 65–99)
Glucose-Capillary: 173 mg/dL — ABNORMAL HIGH (ref 65–99)
Glucose-Capillary: 180 mg/dL — ABNORMAL HIGH (ref 65–99)
Glucose-Capillary: 166 mg/dL — ABNORMAL HIGH (ref 65–99)

## 2015-09-29 LAB — BASIC METABOLIC PANEL
ANION GAP: 11 (ref 5–15)
BUN: 12 mg/dL (ref 6–20)
CALCIUM: 8.4 mg/dL — AB (ref 8.9–10.3)
CO2: 31 mmol/L (ref 22–32)
Chloride: 94 mmol/L — ABNORMAL LOW (ref 101–111)
Creatinine, Ser: 1.58 mg/dL — ABNORMAL HIGH (ref 0.61–1.24)
GFR, EST AFRICAN AMERICAN: 49 mL/min — AB (ref >60)
GFR, EST NON AFRICAN AMERICAN: 42 mL/min — AB (ref >60)
Glucose, Bld: 143 mg/dL — ABNORMAL HIGH (ref 65–99)
Potassium: 3.3 mmol/L — ABNORMAL LOW (ref 3.5–5.1)
Sodium: 136 mmol/L (ref 135–145)

## 2015-09-29 LAB — MAGNESIUM: Magnesium: 1.7 mg/dL (ref 1.7–2.4)

## 2015-09-29 MED ORDER — CHLORPROMAZINE HCL 25 MG/ML IJ SOLN
25.0000 mg | Freq: Four times a day (QID) | INTRAMUSCULAR | Status: DC | PRN
  Filled 2015-09-29: qty 1

## 2015-09-29 MED ORDER — METOCLOPRAMIDE HCL 5 MG/ML IJ SOLN: 5.0000 mg | Freq: Four times a day (QID) | INTRAMUSCULAR | Status: DC | PRN

## 2015-09-29 MED ORDER — ALUM & MAG HYDROXIDE-SIMETH 200-200-20 MG/5ML PO SUSP
30.0000 mL | Freq: Four times a day (QID) | ORAL | Status: DC | PRN
  Administered 2015-09-30: 30 mL via ORAL
  Filled 2015-09-29: qty 30

## 2015-09-29 MED ORDER — ONDANSETRON 4 MG PO TBDP: 4.0000 mg | ORAL_TABLET | Freq: Four times a day (QID) | ORAL | Status: DC | PRN

## 2015-09-29 MED ORDER — ACETAMINOPHEN 650 MG RE SUPP
650.0000 mg | Freq: Four times a day (QID) | RECTAL | Status: DC | PRN
  Filled 2015-09-29: qty 1

## 2015-09-29 MED ORDER — HYDROMORPHONE HCL 1 MG/ML IJ SOLN
0.5000 mg | INTRAMUSCULAR | Status: DC | PRN
  Administered 2015-09-29: 1 mg via INTRAVENOUS
  Filled 2015-09-29: qty 1

## 2015-09-29 MED ORDER — POTASSIUM CHLORIDE 10 MEQ/100ML IV SOLN
10.0000 meq | INTRAVENOUS | Status: AC
  Administered 2015-09-29 (×4): 10 meq via INTRAVENOUS
  Filled 2015-09-29 (×2): qty 100

## 2015-09-29 MED ORDER — ALUM & MAG HYDROXIDE-SIMETH 200-200-20 MG/5ML PO SUSP
30.0000 mL | Freq: Four times a day (QID) | ORAL | Status: DC
  Administered 2015-09-29: 30 mL via ORAL
  Filled 2015-09-29 (×3): qty 30

## 2015-09-29 NOTE — Progress Notes (Signed)
Patient ID: Maurice Perkins, male   DOB: 27-Apr-1946, 69 y.o.   MRN: [id number] 4 Days Post-Op  Subjective: Pt feels a little worse today.  Having some nausea.  Still with a little flatus, but no BM.    Objective: Vital signs in last 24 hours: Temp:  [97.8 F (36.6 C)-98.8 F (37.1 C)] 98 F (36.7 C) (10/16 0616) Pulse Rate:  [57-66] 57 (10/16 0616) Resp:  [15-18] 18 (10/16 0616) BP: (114-126)/(60-75) 126/64 mmHg (10/16 0616) SpO2:  [95 %-97 %] 95 % (10/16 0616) Last BM Date: 09/24/15  Intake/Output from previous day: 10/15 0701 - 10/16 0700 In: 2180 [P.O.:530; I.V.:1550; IV Piggyback:100] Out: 3075 [Urine:3075] Intake/Output this shift:    PE: Abd: soft, but still with some distention, specifically of the lower abdomen, +BS, midline wound is clean and packed Heart: regular LungS: CTAB  Lab Results:   Recent Labs  09/27/15 0348  WBC 8.0  HGB 12.0*  HCT 37.0*  PLT 184   BMET  Recent Labs  09/28/15 0449 09/29/15 0300  NA 132* 136  K 3.3* 3.3*  CL 96* 94*  CO2 25 31  GLUCOSE 125* 143*  BUN 14 12  CREATININE 1.65* 1.58*  CALCIUM 7.5* 8.4*   PT/INR No results for input(s): LABPROT, INR in the last 72 hours. CMP     Component Value Date/Time   NA 136 09/29/2015 0300   K 3.3* 09/29/2015 0300   CL 94* 09/29/2015 0300   CO2 31 09/29/2015 0300   GLUCOSE 143* 09/29/2015 0300   BUN 12 09/29/2015 0300   CREATININE 1.58* 09/29/2015 0300   CALCIUM 8.4* 09/29/2015 0300   PROT 7.1 09/25/2015 1741   ALBUMIN 3.9 09/25/2015 1741   AST 34 09/25/2015 1741   ALT 37 09/25/2015 1741   ALKPHOS 39 09/25/2015 1741   BILITOT 1.6* 09/25/2015 1741   GFRNONAA 42* 09/29/2015 0300   GFRAA 49* 09/29/2015 0300   Lipase     Component Value Date/Time   LIPASE 14* 09/25/2015 1741       Studies/Results: No results found.  Anti-infectives: Anti-infectives    Start     Dose/Rate Route Frequency Ordered Stop   09/26/15 1000  ciprofloxacin (CIPRO) IVPB 400 mg     400  mg 200 mL/hr over 60 Minutes Intravenous Every 12 hours 09/26/15 0233     09/26/15 0800  metroNIDAZOLE (FLAGYL) IVPB 500 mg     500 mg 100 mL/hr over 60 Minutes Intravenous Every 8 hours 09/26/15 0233     09/25/15 2345  ciprofloxacin (CIPRO) IVPB 400 mg     400 mg 200 mL/hr over 60 Minutes Intravenous  Once 09/25/15 2337 09/25/15 2359   09/25/15 2345  metroNIDAZOLE (FLAGYL) IVPB 500 mg     500 mg 100 mL/hr over 60 Minutes Intravenous  Once 09/25/15 2337 09/26/15 0005       Assessment/Plan  POD 4 sbr, for perforated jejunal diverticulitis  1. On IV push and oral pain meds 2. pulm toilet, oob, cont beta blocker 3. cont clear liquids today as he is dealing with some nausea again. 4. Cont dressing changes 5. Cr down to 1.58.  Cont with fluids for now and follow.   6. lovenox scds 7. Plan five days cipro/flagyl D4 8. Hypokalemia, replace K today and check BMET in am 9. Mobilize TID    LOS: 3 days    Afshin Chrystal E 09/29/2015, 10:57 AM Pager: [phone]

## 2015-09-30 LAB — BASIC METABOLIC PANEL
ANION GAP: 10 (ref 5–15)
BUN: 14 mg/dL (ref 6–20)
CALCIUM: 8.3 mg/dL — AB (ref 8.9–10.3)
CO2: 28 mmol/L (ref 22–32)
Chloride: 98 mmol/L — ABNORMAL LOW (ref 101–111)
Creatinine, Ser: 1.4 mg/dL — ABNORMAL HIGH (ref 0.61–1.24)
GFR, EST AFRICAN AMERICAN: 57 mL/min — AB (ref >60)
GFR, EST NON AFRICAN AMERICAN: 49 mL/min — AB (ref >60)
GLUCOSE: 155 mg/dL — AB (ref 65–99)
POTASSIUM: 3.3 mmol/L — AB (ref 3.5–5.1)
SODIUM: 136 mmol/L (ref 135–145)

## 2015-09-30 LAB — GLUCOSE, CAPILLARY
Glucose-Capillary: 152 mg/dL — ABNORMAL HIGH (ref 65–99)
Glucose-Capillary: 216 mg/dL — ABNORMAL HIGH (ref 65–99)
Glucose-Capillary: 196 mg/dL — ABNORMAL HIGH (ref 65–99)
Glucose-Capillary: 175 mg/dL — ABNORMAL HIGH (ref 65–99)

## 2015-09-30 MED ORDER — POLYETHYLENE GLYCOL 3350 17 G PO PACK
17.0000 g | PACK | Freq: Every day | ORAL | Status: DC
  Filled 2015-09-30 (×2): qty 1

## 2015-09-30 MED ORDER — DOCUSATE SODIUM 100 MG PO CAPS
100.0000 mg | ORAL_CAPSULE | Freq: Two times a day (BID) | ORAL | Status: DC
  Administered 2015-09-30 – 2015-10-02 (×3): 100 mg via ORAL
  Filled 2015-09-30 (×3): qty 1

## 2015-09-30 MED ORDER — POTASSIUM CHLORIDE CRYS ER 20 MEQ PO TBCR
40.0000 meq | EXTENDED_RELEASE_TABLET | Freq: Three times a day (TID) | ORAL | Status: DC
Start: 2015-09-30 — End: 2015-10-01
  Administered 2015-09-30 (×2): 40 meq via ORAL
  Filled 2015-09-30 (×2): qty 2

## 2015-09-30 NOTE — Evaluation (Signed)
Physical Therapy Evaluation Patient Details Name: RASHARD RYLE MRN: [id number] DOB: [birth year] Today's Date: 09/30/2015   History of Present Illness  Admitted with perforated jejunal diverticulitis; now s/p Small Bowel Resection  Clinical Impression  Patient evaluated by Physical Therapy with no further acute PT needs identified. All education has been completed and the patient has no further questions. PT is signing off. Thank you for this referral.  Safe to ambulate in hallway with family; Recommend pt walk 4-6 times a day in hallway;     Follow Up Recommendations No PT follow up    Equipment Recommendations  None recommended by PT    Recommendations for Other Services       Precautions / Restrictions Precautions Precautions: None      Mobility  Bed Mobility               General bed mobility comments: Not obvserved, but pt describes rolling to the side to get up; he states it's uncomfortabel but he can do it  Transfers Overall transfer level: Independent Equipment used: None                Ambulation/Gait Ambulation/Gait assistance: Independent;Modified independent (Device/Increase time) Ambulation Distance (Feet): 580 Feet Assistive device:  (pushing IV pole) Gait Pattern/deviations: WFL(Within Functional Limits)     General Gait Details: Slow Steady gait  Stairs            Wheelchair Mobility    Modified Rankin (Stroke Patients Only)       Balance Overall balance assessment: No apparent balance deficits (not formally assessed)                                           Pertinent Vitals/Pain Pain Assessment: 0-10 Pain Score: 3  Pain Location: stomach pains after liquid lunch Pain Descriptors / Indicators: Aching;Cramping Pain Intervention(s): Monitored during session    Home Living Family/patient expects to be discharged to:: Private residence Living Arrangements: Spouse/significant other Available Help at  Discharge: Family;Available PRN/intermittently Type of Home: House Home Access: Stairs to enter   [employer] of Steps: 2 Home Layout: One level Home Equipment: None      Prior Function Level of Independence: Independent         Comments: has 2 horses     Hand Dominance        Extremity/Trunk Assessment   Upper Extremity Assessment: Overall WFL for tasks assessed           Lower Extremity Assessment: Overall WFL for tasks assessed         Communication   Communication: No difficulties  Cognition Arousal/Alertness: Awake/alert Behavior During Therapy: WFL for tasks assessed/performed Overall Cognitive Status: Within Functional Limits for tasks assessed                      General Comments      Exercises        Assessment/Plan    PT Assessment Patent does not need any further PT services  PT Diagnosis Acute pain   PT Problem List    PT Treatment Interventions     PT Goals (Current goals can be found in the Care Plan section) Acute Rehab PT Goals Patient Stated Goal: wants to get back to his normal routine, taking care of his animals (2 horses and a dog) PT Goal Formulation: All assessment and  education complete, DC therapy    Frequency     Barriers to discharge        Co-evaluation               End of Session   Activity Tolerance: Patient tolerated treatment well Patient left: in chair;with call bell/phone within reach;with family/visitor present Nurse Communication: Mobility status         Time: 1610-9604 PT Time Calculation (min) (ACUTE ONLY): 20 min   Charges:   PT Evaluation $Initial PT Evaluation Tier I: 1 Procedure     PT G CodesRoney Marion Hamff 09/30/2015, 12:15 PM  Roney Marion, [place] Pager [phone] Office [phone]

## 2015-09-30 NOTE — Progress Notes (Signed)
Utilization review completed.  

## 2015-09-30 NOTE — Progress Notes (Signed)
Patient ID: NHIA HEAPHY, male   DOB: 1946/11/14, 69 y.o.   MRN: [id number]   LOS: 4 days   POD#5  Subjective: +BM and flatus. Tolerating clears but did have 1 e/o nausea this morning treated with Phenergan. Feels about the same as yesterday, distended.   Objective: Vital signs in last 24 hours: Temp:  [97.7 F (36.5 C)-98.8 F (37.1 C)] 98.3 F (36.8 C) (10/17 0451) Pulse Rate:  [50-57] 50 (10/17 0451) Resp:  [16-18] 18 (10/17 0451) BP: (119-130)/(54-63) 119/54 mmHg (10/17 0451) SpO2:  [95 %-97 %] 95 % (10/17 0451) Last BM Date: 09/29/15   Laboratory  BMET  Recent Labs  09/29/15 0300 09/30/15 0001  NA 136 136  K 3.3* 3.3*  CL 94* 98*  CO2 31 28  GLUCOSE 143* 155*  BUN 12 14  CREATININE 1.58* 1.40*  CALCIUM 8.4* 8.3*   CBG (last 3)   Recent Labs  09/29/15 2216 09/30/15 0746 09/30/15 1152  GLUCAP 166* 152* 216*    Physical Exam General appearance: alert and no distress Resp: clear to auscultation bilaterally Cardio: regular rate and rhythm GI: Soft, +BS (some tympanic), NT   Assessment/Plan: Perforated jejunal diverticulitis s/p SBR -- Continue clears today but I suspect he'll start feeling better soon. Plan to check this afternoon to see about advancing. Cipro/Flagyl D5/5. Give bowel regimen. FEN -- Cr improved again, SL IV, give oral K+ VTE -- SCD's, SQH Dispo -- Ileus    Lisette Abu, PA-C Pager: [phone] 09/30/2015

## 2015-09-30 NOTE — Care Management Important Message (Signed)
Important Message  Patient Details  Name: Maurice Perkins MRN: [id number] Date of Birth: 05/20/1946   Medicare Important Message Given:  Yes-second notification given    Delorse Lek 09/30/2015, 2:01 PM

## 2015-09-30 NOTE — Progress Notes (Signed)
PT Cancellation Note  Patient Details Name: Maurice Perkins MRN: [id number] DOB: [birth year]   Cancelled Treatment:    Reason Eval/Treat Not Completed: Other (comment)   Politely declining PT eval, would like to try and eat some;   Will follow up later today as time allows;  Otherwise, will follow up for PT tomorrow;   Thank you,  Roney Marion, [place] Pager [phone] Office [phone]     Roney Marion [facility] 09/30/2015, 10:31 AM

## 2015-10-01 LAB — GLUCOSE, CAPILLARY
GLUCOSE-CAPILLARY: 120 mg/dL — AB (ref 65–99)
GLUCOSE-CAPILLARY: 200 mg/dL — AB (ref 65–99)
GLUCOSE-CAPILLARY: 157 mg/dL — AB (ref 65–99)
Glucose-Capillary: 150 mg/dL — ABNORMAL HIGH (ref 65–99)

## 2015-10-01 LAB — CULTURE, ROUTINE-ABSCESS: GRAM STAIN: NONE SEEN

## 2015-10-01 LAB — ANAEROBIC CULTURE: Gram Stain: NONE SEEN

## 2015-10-01 MED ORDER — OXYCODONE HCL 5 MG PO TABS
5.0000 mg | ORAL_TABLET | ORAL | Status: DC | PRN
Start: 2015-10-01 — End: 2015-10-02

## 2015-10-01 MED ORDER — POTASSIUM CHLORIDE CRYS ER 20 MEQ PO TBCR
40.0000 meq | EXTENDED_RELEASE_TABLET | Freq: Two times a day (BID) | ORAL | Status: AC
  Administered 2015-10-01 (×2): 40 meq via ORAL
  Filled 2015-10-01 (×2): qty 2

## 2015-10-01 MED ORDER — ACETAMINOPHEN 500 MG PO TABS
1000.0000 mg | ORAL_TABLET | Freq: Four times a day (QID) | ORAL | Status: DC | PRN
  Administered 2015-10-02: 1000 mg via ORAL
  Filled 2015-10-01: qty 2

## 2015-10-01 NOTE — Progress Notes (Signed)
Patient ID: Maurice Perkins, male   DOB: Jan 16, 1946, 69 y.o.   MRN: [id number] 6 Days Post-Op  Subjective: Pt feeling much better today.  Had a large BM yesterday.  Less nausea.  Tolerating full liquids well.  Wants to continue this today prior to going to soft diet.  Objective: Vital signs in last 24 hours: Temp:  [97.8 F (36.6 C)-98.3 F (36.8 C)] 98 F (36.7 C) (10/18 0534) Pulse Rate:  [46-57] 46 (10/18 0534) Resp:  [16] 16 (10/18 0534) BP: (117-130)/(62-69) 126/66 mmHg (10/18 0534) SpO2:  [98 %] 98 % (10/18 0534) Last BM Date: 09/30/15  Intake/Output from previous day: 10/17 0701 - 10/18 0700 In: 50 [P.O.:50] Out: 350 [Urine:350] Intake/Output this shift: Total I/O In: 120 [P.O.:120] Out: 700 [Urine:700]  PE: Abd: soft, wound is clean and packed, +Bs, ND, appropriately tender  Lab Results:  No results for input(s): WBC, HGB, HCT, PLT in the last 72 hours. BMET  Recent Labs  09/29/15 0300 09/30/15 0001  NA 136 136  K 3.3* 3.3*  CL 94* 98*  CO2 31 28  GLUCOSE 143* 155*  BUN 12 14  CREATININE 1.58* 1.40*  CALCIUM 8.4* 8.3*   PT/INR No results for input(s): LABPROT, INR in the last 72 hours. CMP     Component Value Date/Time   NA 136 09/30/2015 0001   K 3.3* 09/30/2015 0001   CL 98* 09/30/2015 0001   CO2 28 09/30/2015 0001   GLUCOSE 155* 09/30/2015 0001   BUN 14 09/30/2015 0001   CREATININE 1.40* 09/30/2015 0001   CALCIUM 8.3* 09/30/2015 0001   PROT 7.1 09/25/2015 1741   ALBUMIN 3.9 09/25/2015 1741   AST 34 09/25/2015 1741   ALT 37 09/25/2015 1741   ALKPHOS 39 09/25/2015 1741   BILITOT 1.6* 09/25/2015 1741   GFRNONAA 49* 09/30/2015 0001   GFRAA 57* 09/30/2015 0001   Lipase     Component Value Date/Time   LIPASE 14* 09/25/2015 1741       Studies/Results: No results found.  Anti-infectives: Anti-infectives    Start     Dose/Rate Route Frequency Ordered Stop   09/26/15 1000  ciprofloxacin (CIPRO) IVPB 400 mg  Status:  Discontinued     400 mg 200 mL/hr over 60 Minutes Intravenous Every 12 hours 09/26/15 0233 10/01/15 1104   09/26/15 0800  metroNIDAZOLE (FLAGYL) IVPB 500 mg  Status:  Discontinued     500 mg 100 mL/hr over 60 Minutes Intravenous Every 8 hours 09/26/15 0233 10/01/15 1104   09/25/15 2345  ciprofloxacin (CIPRO) IVPB 400 mg     400 mg 200 mL/hr over 60 Minutes Intravenous  Once 09/25/15 2337 09/25/15 2359   09/25/15 2345  metroNIDAZOLE (FLAGYL) IVPB 500 mg     500 mg 100 mL/hr over 60 Minutes Intravenous  Once 09/25/15 2337 09/26/15 0005       Assessment/Plan POD 6 sbr, for perforated jejunal diverticulitis  1. oral pain meds 2. pulm toilet, oob, cont beta blocker 3. Fulls today, soft diet in am 4. Cont dressing changes 5. Cr down, saline lock IV  6. lovenox scds 7. Hypokalemia, replace K today and check BMET in am 8. Mobilize TID      Maybe home tomorrow  LOS: 5 days    Birtie Fellman E 10/01/2015, 11:05 AM Pager: [phone]

## 2015-10-01 NOTE — Plan of Care (Signed)
Problem: Food- and Nutrition-Related Knowledge Deficit (NB-1.1) Goal: Nutrition education Formal process to instruct or train a patient/client in a skill or to impart knowledge to help patients/clients voluntarily manage or modify food choices and eating behavior to maintain or improve health. Patient visited for consult for low fiber / carbohydrate modified diet education. Patient alert and sitting upright, with wife in room during visit. Patient lives at home with wife, whom is the primary cook. For breakfast, he usual eats eggs, muffins or toast, milk, and sometimes sausage or [employer]. Lunch often is a sandwich with a meat and cheese, lettuce, tomato and a side of pretzels and diet soda. Dinner is varied, but usually includes a salad with nuts, seeds, and cheese. Usually this is served with grilled meat, vegetables (including: broccoli, green beans, or potatoes). Snacks often include - cheese and crackers, nuts, graham crackers, or ginger snaps. Patient drinks diet sodas and tea sweetened with splenda throughout the day.   Patient has received prior diet education for Type II DM, but described that it was a "long time ago." Patient was interested in reviewing concepts and expressed interested in the nutrition counseling from [facility]. RD student reviewed with patient "Carbohydrate Counting" handout from [employer]. RD student will additionally submit patient referral to [facility].   Patient expressed interest in learning more about how to manage his diverticulitis and was receptive to diet education and handouts. Patient expressed some confusion about what high fiber foods were, and had previously heard conflicting information about if he could eat nuts, seeds, strawberries, etc. RD student provided patient with diet education and handouts from the [employer]: "Fiber-Restricted MNT" and "High Fiber Foods". Highlighted which fiber diet to follow depending on  patient's status. Discussed with patient concerns about low fiber diet and potential interaction with patient's type II DM. Teach-back method was employed, and patient able to describe high fiber and low fiber foods. Patient expressed an interest in more information regarding symptoms for when he is in an active diverticulitis flare-up.   Labs reviewed: CBG (120-216), K 3.3, Cl 98, Cr 1.40  Medications reviewed.   Expect good patient compliance on meeting this dietary changes.     Comments:  Patient visited for consult for low fiber / carbohydrate modified diet education. Patient alert and sitting upright, with wife in room during visit. Patient lives at home with wife, whom is the primary cook. For breakfast, he usual eats eggs, muffins or toast, milk, and sometimes sausage or [employer]. Lunch often is a sandwich with a meat and cheese, lettuce, tomato and a side of pretzels and diet soda. Dinner is varied, but usually includes a salad with nuts, seeds, and cheese. Usually this is served with grilled meat, vegetables (including: broccoli, green beans, or potatoes). Snacks often include - cheese and crackers, nuts, graham crackers, or ginger snaps. Patient drinks diet sodas and tea sweetened with splenda throughout the day.   Patient has received prior diet education for Type II DM, but described that it was a "long time ago." Patient was interested in reviewing concepts and expressed interested in the nutrition counseling from [facility]. RD student reviewed with patient "Carbohydrate Counting" handout from [employer]. RD student will additionally submit patient referral to [facility].   Patient expressed interest in learning more about how to manage his diverticulitis and was receptive to diet education and handouts. Patient expressed some confusion about what high fiber foods were, and had previously heard conflicting  information about if he could eat nuts, seeds, strawberries, etc.  RD student provided patient with diet education and handouts from the [employer]: "Fiber-Restricted MNT" and "High Fiber Foods". Highlighted which fiber diet to follow depending on patient's status. Discussed with patient concerns about low fiber diet and potential interaction with patient's type II DM. Teach-back method was employed, and patient able to describe high fiber and low fiber foods. Patient expressed an interest in more information regarding symptoms for when he is in an active diverticulitis flare-up.   Labs reviewed: CBG (120-216), K 3.3, Cl 98, Cr 1.40   Medications reviewed.   Expect good patient compliance on meeting this dietary changes.

## 2015-10-02 LAB — BASIC METABOLIC PANEL
Anion gap: 10 (ref 5–15)
BUN: 14 mg/dL (ref 6–20)
CHLORIDE: 102 mmol/L (ref 101–111)
CO2: 27 mmol/L (ref 22–32)
Calcium: 8.7 mg/dL — ABNORMAL LOW (ref 8.9–10.3)
Creatinine, Ser: 1.39 mg/dL — ABNORMAL HIGH (ref 0.61–1.24)
GFR calc Af Amer: 57 mL/min — ABNORMAL LOW (ref >60)
GFR calc non Af Amer: 49 mL/min — ABNORMAL LOW (ref >60)
Glucose, Bld: 130 mg/dL — ABNORMAL HIGH (ref 65–99)
POTASSIUM: 4.1 mmol/L (ref 3.5–5.1)
SODIUM: 139 mmol/L (ref 135–145)

## 2015-10-02 LAB — GLUCOSE, CAPILLARY: GLUCOSE-CAPILLARY: 116 mg/dL — AB (ref 65–99)

## 2015-10-02 MED ORDER — OXYCODONE HCL 5 MG PO TABS: 5.0000 mg | ORAL_TABLET | ORAL | Status: DC | PRN

## 2015-10-02 NOTE — Progress Notes (Signed)
AVS given to patient. IV removed.  Discharge instructions demonstrated verbally by patient. Family at the bedside, providing transportation home.  Belongings packed.

## 2015-10-02 NOTE — Discharge Summary (Signed)
Patient ID: Maurice Perkins MRN: [id number] DOB/AGE: [birth year] 69 y.o.  Admit date: 09/25/2015 Discharge date: 10/02/2015  Procedures: Small bowel resection, drainage of intraabdominal abscess, Dr. Barry Dienes  Consults: None  Reason for Admission:  Patient is a 69 year old male who presents with 24 hours of left mid abdominal pain. He has never experienced pain like this before, but stated it was very severe. It has gotten a little bit better with pain medication that he received in the emergency department. He did not take his temperature, but subjectively felt as though he had a fever with chills and sweats. He has had a colonoscopy in the past and has a history of diverticuli in his colon. His current abdominal pain is associated with anorexia, nausea, and vomiting. He also feels bloated. He has never had an episode of diverticulitis in the past.  I have been following the patient for a cystic lesion in the pancreas. This has been unchanged on serial MRI.  He also has a history of type 2 diabetes as well as hypertension with complications of chronic kidney disease.  Admission Diagnoses:  Perforated viscus DM CKD stage 2 HTN Cystic pancreatic lesion  Hospital Course: The patient was admitted and started on IV abx therapy, which he completed a 5 day course of.  He was then taken to the OR emergently from the ED as he was found to have a perforated small bowel from small bowel diverticulitis .  The patient tolerated the procedure well.  He was transferred to the floor.  He was kept NPO for a couple of days due to a mild post op ileus and some nausea.  He was given clear liquids on POD 3 as he was feeling better, but then developed some nausea again.  He was kept on clear liquids for a couple more days, but then this began to improve and his diet was able to be advanced as tolerated.  He had an open midline wound that go BID dressing changes.  This remained clean.  His wife was taught how to do  this for home.  He was stable on POD 7 for Dc home.  His DM and HTN remained stable while here.  PE: Abd: soft, appropriately tender, wound is clean and packed, +BS, ND Skin: small recurrence of a pilonidal cyst with minimal drainage. (if this continues to bother him, he is told to let Dr. Barry Dienes know in case he needs any further management of this, but I have recommended sitz bathes and mobilization as sitting makes it worse.)  Discharge Diagnoses:  Principal Problem:   Diverticulitis of jejunum with perforation s/p SB resection 09/25/2015 Active Problems:   Perforated viscus HTN DM Cystic pancreatic lesion CKD Stage 2  Discharge Medications:   Medication List    STOP taking these medications        acetaminophen-codeine 300-30 MG tablet  Commonly known as:  TYLENOL #3      TAKE these medications        APIDRA 100 UNIT/ML injection  Generic drug:  insulin glulisine  Inject 3 Units into the skin 3 (three) times daily as needed. Sliding scale. Only uses if sugar is running high     aspirin 81 MG tablet  Take 81 mg by mouth daily.     atenolol 50 MG tablet  Commonly known as:  TENORMIN  Take 25 mg by mouth every morning.     atorvastatin 40 MG tablet  Commonly known as:  LIPITOR  Take 40  mg by mouth daily.     fenofibrate 160 MG tablet  Take 160 mg by mouth daily.     glimepiride 4 MG tablet  Commonly known as:  AMARYL  Take 4 mg by mouth 2 (two) times daily.     GLUCOSAMINE CHONDROITIN MSM PO  Take 2 tablets by mouth daily.     Insulin Glargine 100 UNIT/ML Solostar Pen  Commonly known as:  LANTUS  Inject 60 Units into the skin every morning.     losartan-hydrochlorothiazide 50-12.5 MG tablet  Commonly known as:  HYZAAR  Take 1 tablet by mouth every morning.     metFORMIN 500 MG tablet  Commonly known as:  GLUCOPHAGE  Take 500 mg by mouth 5 (five) times daily.     multivitamin with minerals Tabs tablet  Take 1 tablet by mouth daily.     oxyCODONE 5 MG  immediate release tablet  Commonly known as:  Oxy IR/ROXICODONE  Take 1-2 tablets (5-10 mg total) by mouth every 4 (four) hours as needed for moderate pain.        Discharge Instructions:     Follow-up Information    Follow up with [facility], [place] In 3 weeks.   Specialty:  General Surgery   Why:  our office will call you   Contact information:   [address] [place] [address] [phone]       Signed: Henreitta Cea 10/02/2015, 12:34 PM

## 2015-10-02 NOTE — Discharge Instructions (Signed)
Dressing Change A dressing is a material placed over wounds. It keeps the wound clean, dry, and protected from further injury. This provides an environment that favors wound healing.  BEFORE YOU BEGIN  Get your supplies together. Things you may need include:  Saline solution.  Flexible gauze dressing.  Medicated cream.  Tape.  Gloves.  Abdominal dressing pads.  Gauze squares.  Plastic bags.  Take pain medicine 30 minutes before the dressing change if you need it.  Take a shower before you do the first dressing change of the day. Use plastic wrap or a plastic bag to prevent the dressing from getting wet. REMOVING YOUR OLD DRESSING   Wash your hands with soap and water. Dry your hands with a clean towel.  Put on your gloves.  Remove any tape.  Carefully remove the old dressing. If the dressing sticks, you may dampen it with warm water to loosen it, or follow your caregiver's specific directions.  Remove any gauze or packing tape that is in your wound.  Take off your gloves.  Put the gloves, tape, gauze, or any packing tape into a plastic bag. CHANGING YOUR DRESSING  Open the supplies.  Take the cap off the saline solution.  Open the gauze package so that the gauze remains on the inside of the package.  Put on your gloves.  Clean your wound as told by your caregiver.  If you have been told to keep your wound dry, follow those instructions.  Your caregiver may tell you to do one or more of the following:  Pick up the gauze. Pour the saline solution over the gauze. Squeeze out the extra saline solution.  Put medicated cream or other medicine on your wound if you have been told to do so.  Put the solution soaked gauze only in your wound, not on the skin around it.  Pack your wound loosely or as told by your caregiver.  Put dry gauze on your wound.  Put abdominal dressing pads over the dry gauze if your wet gauze soaks through.  Tape the abdominal dressing  pads in place so they will not fall off. Do not wrap the tape completely around the affected part (arm, leg, abdomen).  Wrap the dressing pads with a flexible gauze dressing to secure it in place.  Take off your gloves. Put them in the plastic bag with the old dressing. Tie the bag shut and throw it away.  Keep the dressing clean and dry until your next dressing change.  Wash your hands. SEEK MEDICAL CARE IF:  Your skin around the wound looks red.  Your wound feels more tender or sore.  You see pus in the wound.  Your wound smells bad.  You have a fever.  Your skin around the wound has a rash that itches and burns.  You see black or yellow skin in your wound that was not there before.  You feel nauseous, throw up, and feel very tired.   This information is not intended to replace advice given to you by your health care provider. Make sure you discuss any questions you have with your health care provider.   Document Released: 01/07/2005 Document Revised: 02/22/2012 Document Reviewed: 10/12/2011 Elsevier Interactive Patient Education 2016 [facility], [place] [phone]  OPEN ABDOMINAL SURGERY: POST OP INSTRUCTIONS  Always review your discharge instruction sheet given to you by the facility where your surgery was performed.  IF YOU  HAVE DISABILITY OR FAMILY LEAVE FORMS, YOU MUST BRING THEM TO THE OFFICE FOR PROCESSING.  PLEASE DO NOT GIVE THEM TO YOUR DOCTOR.  1. A prescription for pain medication may be given to you upon discharge.  Take your pain medication as prescribed, if needed.  If narcotic pain medicine is not needed, then you may take acetaminophen (Tylenol) or ibuprofen (Advil) as needed. 2. Take your usually prescribed medications unless otherwise directed. 3. If you need a refill on your pain medication, please contact your pharmacy. They will contact our office to request authorization.  Prescriptions will not be filled after  5pm or on week-ends. 4. You should follow a light diet the first few days after arrival home, such as soup and crackers, pudding, etc.unless your doctor has advised otherwise. A high-fiber, low fat diet can be resumed as tolerated.   Be sure to include lots of fluids daily. Most patients will experience some swelling and bruising on the chest and neck area.  Ice packs will help.  Swelling and bruising can take several days to resolve 5. Most patients will experience some swelling and bruising in the area of the incision. Ice pack will help. Swelling and bruising can take several days to resolve..  6. It is common to experience some constipation if taking pain medication after surgery.  Increasing fluid intake and taking a stool softener will usually help or prevent this problem from occurring.  A mild laxative (Milk of Magnesia or Miralax) should be taken according to package directions if there are no bowel movements after 48 hours. 7.  You may have steri-strips (small skin tapes) in place directly over the incision.  These strips should be left on the skin for 7-10 days.  If your surgeon used skin glue on the incision, you may shower in 24 hours.  The glue will flake off over the next 2-3 weeks.  Any sutures or staples will be removed at the office during your follow-up visit. You may find that a light gauze bandage over your incision may keep your staples from being rubbed or pulled. You may shower and replace the bandage daily. 8. ACTIVITIES:  You may resume regular (light) daily activities beginning the next day--such as daily self-care, walking, climbing stairs--gradually increasing activities as tolerated.  You may have sexual intercourse when it is comfortable.  Refrain from any heavy lifting or straining until approved by your doctor. a. You may drive when you no longer are taking prescription pain medication, you can comfortably wear a seatbelt, and you can safely maneuver your car and apply  brakes b. Return to Work: ___________________________________ 71. You should see your doctor in the office for a follow-up appointment approximately two weeks after your surgery.  Make sure that you call for this appointment within a day or two after you arrive home to insure a convenient appointment time. OTHER INSTRUCTIONS:  _____________________________________________________________ _____________________________________________________________  WHEN TO CALL YOUR DOCTOR: 1. Fever over 101.0 2. Inability to urinate 3. Nausea and/or vomiting 4. Extreme swelling or bruising 5. Continued bleeding from incision. 6. Increased pain, redness, or drainage from the incision. 7. Difficulty swallowing or breathing 8. Muscle cramping or spasms. 9. Numbness or tingling in hands or feet or around lips.  The clinic staff is available to answer your questions during regular business hours.  Please dont hesitate to call and ask to speak to one of the nurses if you have concerns.  For further questions, please visit [email or web address]

## 2015-11-19 ENCOUNTER — Encounter: Payer: Commercial Managed Care - HMO | Attending: Internal Medicine | Admitting: *Deleted

## 2015-11-19 VITALS — Ht 75.0 in | Wt 217.4 lb

## 2015-11-19 DIAGNOSIS — E119 Type 2 diabetes mellitus without complications: Secondary | ICD-10-CM

## 2015-11-19 NOTE — Patient Instructions (Addendum)
Plan:  Aim for 2-3 Carb Choices per meal (30-45 grams) +/- 1 either way  Aim for 0-15 Carbs per snack if hungry  Include protein in moderation with your meals and snacks Consider reading food labels for Total Carbohydrate and Fat Grams of foods Consider  increasing your activity level by walk for 30 minutes daily as tolerated Consider checking BG at alternate times per day as directed by MD  continuer taking medication as directed by MD  Consider Brummel & Brown vs Butter Consider Olive Oil to fry eggs Add protein to your popcorn  ALWAYS HAVE PROTEIN WITH CARBOHYDRATES  In order to provide Dr. Nyoka Cowden with solid data to manage your insulin consider: Test - Lantus - Breakfast - Metformin & glimeperide    2 hours after the first bite- test your glucose Apidra Insulin 15 mins prior to Lunch - test 2 hours after 1st bite Apidra Insulin 15 mins prior to dinner - test 2 hours after 1st bite Consider a snack in the evening after you have testing your glucose

## 2015-11-20 ENCOUNTER — Encounter: Payer: Self-pay | Admitting: *Deleted

## 2015-11-20 DIAGNOSIS — E118 Type 2 diabetes mellitus with unspecified complications: Secondary | ICD-10-CM | POA: Diagnosis not present

## 2015-11-20 DIAGNOSIS — D559 Anemia due to enzyme disorder, unspecified: Secondary | ICD-10-CM | POA: Diagnosis not present

## 2015-11-20 DIAGNOSIS — K5190 Ulcerative colitis, unspecified, without complications: Secondary | ICD-10-CM | POA: Diagnosis not present

## 2015-11-20 DIAGNOSIS — Z0000 Encounter for general adult medical examination without abnormal findings: Secondary | ICD-10-CM | POA: Diagnosis not present

## 2015-11-20 DIAGNOSIS — Z23 Encounter for immunization: Secondary | ICD-10-CM | POA: Diagnosis not present

## 2015-11-20 DIAGNOSIS — C259 Malignant neoplasm of pancreas, unspecified: Secondary | ICD-10-CM | POA: Diagnosis not present

## 2015-11-20 DIAGNOSIS — E1159 Type 2 diabetes mellitus with other circulatory complications: Secondary | ICD-10-CM | POA: Diagnosis not present

## 2015-11-20 DIAGNOSIS — L0390 Cellulitis, unspecified: Secondary | ICD-10-CM | POA: Diagnosis not present

## 2015-11-20 DIAGNOSIS — Z86010 Personal history of colonic polyps: Secondary | ICD-10-CM | POA: Diagnosis not present

## 2015-11-20 DIAGNOSIS — K5730 Diverticulosis of large intestine without perforation or abscess without bleeding: Secondary | ICD-10-CM | POA: Diagnosis not present

## 2015-11-20 DIAGNOSIS — Z125 Encounter for screening for malignant neoplasm of prostate: Secondary | ICD-10-CM | POA: Diagnosis not present

## 2015-11-20 DIAGNOSIS — E1165 Type 2 diabetes mellitus with hyperglycemia: Secondary | ICD-10-CM | POA: Diagnosis not present

## 2015-11-20 DIAGNOSIS — T8131XD Disruption of external operation (surgical) wound, not elsewhere classified, subsequent encounter: Secondary | ICD-10-CM | POA: Diagnosis not present

## 2015-11-20 NOTE — Progress Notes (Signed)
Diabetes Self-Management Education  Visit Type: First/Initial  Appt. Start Time: 1530 Appt. End Time: 1700  11/20/2015  Mr. Maurice Perkins, identified by name and date of birth, is a 69 y.o. male with a diagnosis of Diabetes: Type 2. Maurice Perkins presents for DSME following recent hospitalization and surgery. As a result of partial bowel resection he has been following a high fiber diet. He is requesting dietary recommendations for T2DM. We reviewed his medication and testing regimen and I felt there was opportunity for improvement in order to obtain better data in order to adjust insulin more affectively. A1c goal 6.5% per Dr. Nyoka Cowden.  ASSESSMENT  Height 6\' 3"  (1.905 m), weight 217 lb 6.4 oz (98.612 kg). Body mass index is 27.17 kg/(m^2).      Diabetes Self-Management Education - 11/20/15 1547    Visit Information   Visit Type First/Initial   Initial Visit   Diabetes Type Type 2   Are you currently following a meal plan? Yes   What type of meal plan do you follow? High Fiber, low carb   Are you taking your medications as prescribed? Yes   Date Diagnosed 1996   Health Coping   How would you rate your overall health? Good   Psychosocial Assessment   Patient Belief/Attitude about Diabetes Motivated to manage diabetes   Self-care barriers None   Self-management support Doctor's office;Family;CDE visits   Other persons present Patient   Patient Concerns Nutrition/Meal planning;Medication;Glycemic Control   Special Needs None   Preferred Learning Style No preference indicated   Learning Readiness Change in progress   How often do you need to have someone help you when you read instructions, pamphlets, or other written materials from your doctor or pharmacy? 1 - Never   Complications   Last HgB A1C per patient/outside source 9 %   How often do you check your blood sugar? 1-2 times/day   Fasting Blood glucose range (mg/dL) 70-129   Postprandial Blood glucose range (mg/dL) 130-179    Have you had a dilated eye exam in the past 12 months? No   Have you had a dental exam in the past 12 months? No   Are you checking your feet? Yes   How many days per week are you checking your feet? 1   Exercise   Exercise Type Light (walking / raking leaves)   How many days per week to you exercise? 3   How many minutes per day do you exercise? 30   Total minutes per week of exercise 90   Patient Education   Previous Diabetes Education Yes (please comment)  1997   Disease state  Definition of diabetes, type 1 and 2, and the diagnosis of diabetes;Factors that contribute to the development of diabetes   Nutrition management  Role of diet in the treatment of diabetes and the relationship between the three main macronutrients and blood glucose level   Physical activity and exercise  Role of exercise on diabetes management, blood pressure control and cardiac health.   Medications Reviewed patients medication for diabetes, action, purpose, timing of dose and side effects.   Monitoring Purpose and frequency of SMBG.;Identified appropriate SMBG and/or A1C goals.   Chronic complications Relationship between chronic complications and blood glucose control;Identified and discussed with patient  current chronic complications   Psychosocial adjustment Role of stress on diabetes   Individualized Goals (developed by patient)   Nutrition General guidelines for healthy choices and portions discussed   Physical Activity Exercise 3-5 times  per week;30 minutes per day   Medications take my medication as prescribed   Monitoring  test blood glucose pre and post meals as discussed   Reducing Risk get labs drawn;increase portions of nuts and seeds   Outcomes   Expected Outcomes Demonstrated interest in learning. Expect positive outcomes   Future DMSE PRN   Program Status Completed      Individualized Plan for Diabetes Self-Management Training:   Learning Objective:  Patient will have a greater  understanding of diabetes self-management. Patient education plan is to attend individual and/or group sessions per assessed needs and concerns.    Patient Instructions  Plan:  Aim for 2-3 Carb Choices per meal (30-45 grams) +/- 1 either way  Aim for 0-15 Carbs per snack if hungry  Include protein in moderation with your meals and snacks Consider reading food labels for Total Carbohydrate and Fat Grams of foods Consider  increasing your activity level by walk for 30 minutes daily as tolerated Consider checking BG at alternate times per day as directed by MD  continuer taking medication as directed by MD  Consider Brummel & Brown vs Butter Consider Olive Oil to fry eggs Add protein to your popcorn  ALWAYS HAVE PROTEIN WITH CARBOHYDRATES  In order to provide Dr. Nyoka Cowden with solid data to manage your insulin consider: Test - Lantus - Breakfast - Metformin & glimeperide    2 hours after the first bite- test your glucose Apidra Insulin 15 mins prior to Lunch - test 2 hours after 1st bit Apidra Insulin 15 mins prior to dinner - test 2 hours after 1st bite Consider a snack in the evening after you have testing your glucose   Expected Outcomes:  Demonstrated interest in learning. Expect positive outcomes  Education material provided: Living Well with Diabetes, A1C conversion sheet, Meal plan card, My Plate and Snack sheet  If problems or questions, patient to contact team via:  Phone  Future DSME appointment: PRN

## 2015-11-21 DIAGNOSIS — H2513 Age-related nuclear cataract, bilateral: Secondary | ICD-10-CM | POA: Diagnosis not present

## 2015-11-21 DIAGNOSIS — E119 Type 2 diabetes mellitus without complications: Secondary | ICD-10-CM | POA: Diagnosis not present

## 2015-11-21 DIAGNOSIS — H43812 Vitreous degeneration, left eye: Secondary | ICD-10-CM | POA: Diagnosis not present

## 2015-11-26 DIAGNOSIS — E1165 Type 2 diabetes mellitus with hyperglycemia: Secondary | ICD-10-CM | POA: Diagnosis not present

## 2015-11-26 DIAGNOSIS — T8131XD Disruption of external operation (surgical) wound, not elsewhere classified, subsequent encounter: Secondary | ICD-10-CM | POA: Diagnosis not present

## 2015-11-26 DIAGNOSIS — T8130XA Disruption of wound, unspecified, initial encounter: Secondary | ICD-10-CM | POA: Diagnosis not present

## 2015-11-29 DIAGNOSIS — T8130XD Disruption of wound, unspecified, subsequent encounter: Secondary | ICD-10-CM | POA: Diagnosis not present

## 2015-12-23 ENCOUNTER — Other Ambulatory Visit: Payer: Self-pay | Admitting: Surgery

## 2015-12-24 DIAGNOSIS — L570 Actinic keratosis: Secondary | ICD-10-CM | POA: Diagnosis not present

## 2015-12-24 DIAGNOSIS — B078 Other viral warts: Secondary | ICD-10-CM | POA: Diagnosis not present

## 2015-12-24 DIAGNOSIS — L821 Other seborrheic keratosis: Secondary | ICD-10-CM | POA: Diagnosis not present

## 2015-12-24 DIAGNOSIS — L812 Freckles: Secondary | ICD-10-CM | POA: Diagnosis not present

## 2015-12-24 DIAGNOSIS — D1801 Hemangioma of skin and subcutaneous tissue: Secondary | ICD-10-CM | POA: Diagnosis not present

## 2016-01-01 DIAGNOSIS — E1165 Type 2 diabetes mellitus with hyperglycemia: Secondary | ICD-10-CM | POA: Diagnosis not present

## 2016-01-21 DIAGNOSIS — L570 Actinic keratosis: Secondary | ICD-10-CM | POA: Diagnosis not present

## 2016-03-09 DIAGNOSIS — K432 Incisional hernia without obstruction or gangrene: Secondary | ICD-10-CM | POA: Diagnosis not present

## 2016-03-11 ENCOUNTER — Other Ambulatory Visit: Payer: Self-pay | Admitting: General Surgery

## 2016-03-11 DIAGNOSIS — K432 Incisional hernia without obstruction or gangrene: Secondary | ICD-10-CM

## 2016-03-16 ENCOUNTER — Ambulatory Visit
Admission: RE | Admit: 2016-03-16 | Discharge: 2016-03-16 | Disposition: A | Discharge status: Home / Self Care | Payer: Commercial Managed Care - HMO | Source: Ambulatory Visit | Attending: General Surgery | Admitting: General Surgery

## 2016-03-16 DIAGNOSIS — D1803 Hemangioma of intra-abdominal structures: Secondary | ICD-10-CM | POA: Diagnosis not present

## 2016-03-16 DIAGNOSIS — K432 Incisional hernia without obstruction or gangrene: Secondary | ICD-10-CM

## 2016-03-16 MED ORDER — IOPAMIDOL (ISOVUE-300) INJECTION 61%
125.0000 mL | Freq: Once | INTRAVENOUS | Status: AC | PRN
  Administered 2016-03-16: 125 mL via INTRAVENOUS

## 2016-03-25 NOTE — Progress Notes (Signed)
Quick Note:  Please let patient know that there does not look like a hernia on the scan, but I still think he may have a hernia. ______

## 2016-04-30 DIAGNOSIS — K5730 Diverticulosis of large intestine without perforation or abscess without bleeding: Secondary | ICD-10-CM | POA: Diagnosis not present

## 2016-04-30 DIAGNOSIS — E118 Type 2 diabetes mellitus with unspecified complications: Secondary | ICD-10-CM | POA: Diagnosis not present

## 2016-04-30 DIAGNOSIS — I119 Hypertensive heart disease without heart failure: Secondary | ICD-10-CM | POA: Diagnosis not present

## 2016-04-30 DIAGNOSIS — E1165 Type 2 diabetes mellitus with hyperglycemia: Secondary | ICD-10-CM | POA: Diagnosis not present

## 2016-07-13 DIAGNOSIS — E118 Type 2 diabetes mellitus with unspecified complications: Secondary | ICD-10-CM | POA: Diagnosis not present

## 2016-08-11 ENCOUNTER — Other Ambulatory Visit (HOSPITAL_COMMUNITY): Payer: Self-pay | Admitting: General Surgery

## 2016-08-11 DIAGNOSIS — K862 Cyst of pancreas: Secondary | ICD-10-CM

## 2016-08-12 ENCOUNTER — Other Ambulatory Visit: Payer: Self-pay | Admitting: General Surgery

## 2016-08-19 ENCOUNTER — Other Ambulatory Visit (HOSPITAL_COMMUNITY): Payer: Self-pay | Admitting: General Surgery

## 2016-08-19 ENCOUNTER — Ambulatory Visit (HOSPITAL_COMMUNITY)
Admission: RE | Admit: 2016-08-19 | Discharge: 2016-08-19 | Disposition: A | Discharge status: Home / Self Care | Payer: Commercial Managed Care - HMO | Source: Ambulatory Visit | Attending: General Surgery | Admitting: General Surgery

## 2016-08-19 DIAGNOSIS — K862 Cyst of pancreas: Secondary | ICD-10-CM

## 2016-08-19 DIAGNOSIS — K7689 Other specified diseases of liver: Secondary | ICD-10-CM | POA: Diagnosis not present

## 2016-08-19 DIAGNOSIS — N281 Cyst of kidney, acquired: Secondary | ICD-10-CM | POA: Diagnosis not present

## 2016-08-19 DIAGNOSIS — R935 Abnormal findings on diagnostic imaging of other abdominal regions, including retroperitoneum: Secondary | ICD-10-CM | POA: Diagnosis not present

## 2016-08-19 LAB — POCT I-STAT CREATININE: CREATININE: 2 mg/dL — AB (ref 0.61–1.24)

## 2016-08-20 DIAGNOSIS — Z23 Encounter for immunization: Secondary | ICD-10-CM | POA: Diagnosis not present

## 2016-08-20 DIAGNOSIS — R8279 Other abnormal findings on microbiological examination of urine: Secondary | ICD-10-CM | POA: Diagnosis not present

## 2016-08-20 DIAGNOSIS — E1165 Type 2 diabetes mellitus with hyperglycemia: Secondary | ICD-10-CM | POA: Diagnosis not present

## 2016-08-20 NOTE — Progress Notes (Signed)
Please let patient know no change in mass.  Will plan annual MRI times 2 more.

## 2016-09-14 DIAGNOSIS — K432 Incisional hernia without obstruction or gangrene: Secondary | ICD-10-CM | POA: Diagnosis not present

## 2016-09-21 ENCOUNTER — Other Ambulatory Visit (HOSPITAL_COMMUNITY): Payer: Self-pay | Admitting: *Deleted

## 2016-09-21 NOTE — Pre-Procedure Instructions (Signed)
COSME GULLO  09/21/2016      [id number] - [place], [place] [address] Phone: [phone] Fax: [phone]  [place] ([place]), [place] - [id number] [place] ([place])  [address] Phone: [phone] Fax: [phone]  [place], [place] - [address] [place] [address] Phone: [phone] Fax: [phone]   Your procedure is scheduled on 09-30-2017  Wednesday .  Report to [facility] Admitting at 7:00 A.M.   Call this number if you have problems the morning of surgery:  [phone]   Remember:  Do not eat food or drink liquids after midnight.   Take these medicines the morning of surgery with A SIP OF WATER Atenolol(Tenormin),Atorvastin(Lipitor),Fenofibrate,   STOP ASPIRIN,ANTIINFLAMATORIES (IBUPROFEN,ALEVE,MOTRIN,ADVIL,GOODY'S  POWDERS),HERBAL SUPPLEMENTS,FISH OIL,AND VITAMINS 5-7 DAYS PRIOR TO SURGERY               How to Manage Your Diabetes Before and After Surgery  Why is it important to control my blood sugar before and after surgery? . Improving blood sugar levels before and after surgery helps healing and can limit problems. . A way of improving blood sugar control is eating a healthy diet by: o  Eating less sugar and carbohydrates o  Increasing activity/exercise o  Talking with your doctor about reaching your blood sugar goals . High blood sugars (greater than 180 mg/dL) can raise your risk of infections and slow your recovery, so you will need to focus on controlling your diabetes during the weeks before surgery. . Make sure that the doctor who takes care of your diabetes knows about your planned surgery including the date and location.  How do I manage my blood sugar before surgery? . Check your blood sugar at least 4 times a day, starting 2 days before surgery, to  make sure that the level is not too high or low. o Check your blood sugar the morning of your surgery when you wake up and every 2 hours until you get to the Short Stay unit. . If your blood sugar is less than 70 mg/dL, you will need to treat for low blood sugar: o Do not take insulin. o Treat a low blood sugar (less than 70 mg/dL) with  cup of clear juice (cranberry or apple), 4 glucose tablets, OR glucose gel. o Recheck blood sugar in 15 minutes after treatment (to make sure it is greater than 70 mg/dL). If your blood sugar is not greater than 70 mg/dL on recheck, call [phone] for further instructions. . Report your blood sugar to the short stay nurse when you get to Short Stay.  . If you are admitted to the hospital after surgery: o Your blood sugar will be checked by the staff and you will probably be given insulin after surgery (instead of oral diabetes medicines) to make sure you have good blood sugar levels. o The goal for blood sugar control after surgery is 80-180 mg/dL.              WHAT DO I DO ABOUT MY DIABETES MEDICATION?   Marland Kitchen Do not take oral diabetes medicines (pills) the morning of surgery       THE MORNING OF SURGERY, take 30 units of _Lantus_insulin.    Reviewed and Endorsed by [facility] Patient Education Committee, August 2015     Do not wear jewelry,   Do  not wear lotions, powders, or perfumes, or deoderant.  Do not shave 48 hours prior to surgery.  Men may shave face and neck.   Do not bring valuables to the hospital.  [facility] is not responsible for any belongings or valuables.  Contacts, dentures or bridgework may not be worn into surgery.  Leave your suitcase in the car.  After surgery it may be brought to your room.  For patients admitted to the hospital, discharge time will be determined by your treatment team.  Patients discharged the day of surgery will not be allowed to drive home.     Special instructions:  See Attached sheet  for instructions on CHG showers  .

## 2016-09-22 ENCOUNTER — Encounter (HOSPITAL_COMMUNITY)
Admission: RE | Admit: 2016-09-22 | Discharge: 2016-09-22 | Disposition: A | Discharge status: Home / Self Care | Payer: Commercial Managed Care - HMO | Source: Ambulatory Visit | Attending: General Surgery | Admitting: General Surgery

## 2016-09-22 ENCOUNTER — Encounter (HOSPITAL_COMMUNITY): Payer: Self-pay

## 2016-09-22 DIAGNOSIS — K439 Ventral hernia without obstruction or gangrene: Secondary | ICD-10-CM | POA: Diagnosis not present

## 2016-09-22 DIAGNOSIS — Z7982 Long term (current) use of aspirin: Secondary | ICD-10-CM | POA: Diagnosis not present

## 2016-09-22 DIAGNOSIS — Z87891 Personal history of nicotine dependence: Secondary | ICD-10-CM | POA: Insufficient documentation

## 2016-09-22 DIAGNOSIS — E119 Type 2 diabetes mellitus without complications: Secondary | ICD-10-CM | POA: Diagnosis not present

## 2016-09-22 DIAGNOSIS — Z79899 Other long term (current) drug therapy: Secondary | ICD-10-CM | POA: Insufficient documentation

## 2016-09-22 DIAGNOSIS — Z01812 Encounter for preprocedural laboratory examination: Secondary | ICD-10-CM | POA: Insufficient documentation

## 2016-09-22 DIAGNOSIS — Z794 Long term (current) use of insulin: Secondary | ICD-10-CM | POA: Diagnosis not present

## 2016-09-22 DIAGNOSIS — K8689 Other specified diseases of pancreas: Secondary | ICD-10-CM | POA: Insufficient documentation

## 2016-09-22 DIAGNOSIS — I10 Essential (primary) hypertension: Secondary | ICD-10-CM | POA: Diagnosis not present

## 2016-09-22 DIAGNOSIS — Z01810 Encounter for preprocedural cardiovascular examination: Secondary | ICD-10-CM | POA: Insufficient documentation

## 2016-09-22 DIAGNOSIS — Z9889 Other specified postprocedural states: Secondary | ICD-10-CM | POA: Diagnosis not present

## 2016-09-22 DIAGNOSIS — E785 Hyperlipidemia, unspecified: Secondary | ICD-10-CM | POA: Diagnosis not present

## 2016-09-22 LAB — URINALYSIS, ROUTINE W REFLEX MICROSCOPIC
Bilirubin Urine: NEGATIVE
Glucose, UA: NEGATIVE mg/dL
HGB URINE DIPSTICK: NEGATIVE
Ketones, ur: NEGATIVE mg/dL
LEUKOCYTES UA: NEGATIVE
NITRITE: NEGATIVE
Protein, ur: NEGATIVE mg/dL
SPECIFIC GRAVITY, URINE: 1.022 (ref 1.005–1.030)
pH: 5.5 (ref 5.0–8.0)

## 2016-09-22 LAB — BASIC METABOLIC PANEL
ANION GAP: 11 (ref 5–15)
BUN: 25 mg/dL — ABNORMAL HIGH (ref 6–20)
CHLORIDE: 104 mmol/L (ref 101–111)
CO2: 26 mmol/L (ref 22–32)
Calcium: 9.9 mg/dL (ref 8.9–10.3)
Creatinine, Ser: 1.7 mg/dL — ABNORMAL HIGH (ref 0.61–1.24)
GFR, EST AFRICAN AMERICAN: 45 mL/min — AB (ref >60)
GFR, EST NON AFRICAN AMERICAN: 38 mL/min — AB (ref >60)
Glucose, Bld: 76 mg/dL (ref 65–99)
POTASSIUM: 3.5 mmol/L (ref 3.5–5.1)
SODIUM: 141 mmol/L (ref 135–145)

## 2016-09-22 LAB — CBC
HEMATOCRIT: 46.1 % (ref 39.0–52.0)
Hemoglobin: 15.2 g/dL (ref 13.0–17.0)
MCH: 29.6 pg (ref 26.0–34.0)
MCHC: 33 g/dL (ref 30.0–36.0)
MCV: 89.7 fL (ref 78.0–100.0)
Platelets: 267 10*3/uL (ref 150–400)
RBC: 5.14 MIL/uL (ref 4.22–5.81)
RDW: 14.4 % (ref 11.5–15.5)
WBC: 7.3 10*3/uL (ref 4.0–10.5)

## 2016-09-22 LAB — GLUCOSE, CAPILLARY: Glucose-Capillary: 76 mg/dL (ref 65–99)

## 2016-09-23 LAB — HEMOGLOBIN A1C
HEMOGLOBIN A1C: 8.3 % — AB (ref 4.8–5.6)
MEAN PLASMA GLUCOSE: 192 mg/dL

## 2016-09-23 NOTE — Progress Notes (Addendum)
Anesthesia Chart Review:  Pt is a 70 year old male scheduled for laparoscopic ventral hernia, insertion of mesh on 09/30/2016 with Stark Klein, MD.   - PCP is Levin Erp, MD, who is aware of pt's upcoming surgery.   PMH includes:  HTN, DM, hyperlipidemia, pancreatic mass. Former smoker. BMI 27. S/p small bowel resection 09/25/15.   Medications include: ASA, atenolol, lipitor, fenofibrate, glimepiride, novolog, lantus, losartan-hctz, metformin  Preoperative labs reviewed.   - HgbA1c 8.3, glucose 76.  - Cr 1.7, BUN 25. This is consistent with prior results from PCP's office over last 2 years.   EKG 09/22/16: Sinus bradycardia (54 bpm) with 1st degree A-V block. Minimal voltage criteria for LVH, may be normal variant.  Nuclear stress test 08/11/11: Normal stress nuclear study.  If no changes, I anticipate pt can proceed with surgery as scheduled.   Willeen Cass, FNP-BC [facility] Phone: [phone] 09/24/2016 3:26 PM

## 2016-09-29 NOTE — H&P (Signed)
Maurice Perkins 09/14/2016 2:39 PM Location: [facility] Patient #: [id number] DOB: 1946/07/15 Married / Language: English / Race: White Male   History of Present Illness Stark Klein MD; 09/14/2016 3:39 PM) Patient words: Pre op.  The patient is a 70 year old male presenting for a post-operative visit. 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 He had an open wound which was followed closely in the office.   He developed a hernia and is here to discuss surgery. We will do this in around 2 weeks.    Allergies Patsey Berthold, [place]; 09/14/2016 2:39 PM) PenicillAMINE *ASSORTED CLASSES*  Medication History Patsey Berthold, CMA; 09/14/2016 2:41 PM) Aspirin (81MG  Tablet DR, Oral) Active. Atenolol (50MG  Tablet, Oral) Active. Atorvastatin Calcium (40MG  Tablet, Oral) Active. Fenofibrate (160MG  Tablet, Oral) Active. Amaryl (4MG  Tablet, Oral) Active. Lantus SoloStar (100UNIT/ML Soln Pen-inj, Subcutaneous) Active. NovoLOG FlexPen (100UNIT/ML Soln Pen-inj, Subcutaneous) Active. Losartan Potassium-HCTZ (50-12.5MG  Tablet, Oral) Active. MetFORMIN HCl (500MG  Tablet, Oral) Active. Glucosamine Chondroitin (Oral) Active. Multivitamin (Oral) Active. Medications Reconciled    Review of Systems Stark Klein MD; 09/14/2016 3:39 PM) All other systems negative  Vitals Patsey Berthold CMA; 09/14/2016 2:42 PM) 09/14/2016 2:41 PM Weight: 215.4 lb Height: 72in Body Surface Area: 2.2 m Body Mass Index: 29.21 kg/m  Temp.: 97.82F  Pulse: 77 (Regular)  BP: 102/60 (Sitting, Left Arm, Standard)       Physical Exam Stark Klein MD; 09/14/2016 3:40 PM) General Mental Status-Alert. General Appearance-Consistent with stated age. Hydration-Well hydrated. Voice-Normal.  Chest and Lung Exam Chest and lung exam reveals -quiet, even and easy respiratory effort with no use of accessory  muscles. Inspection Chest Wall - Normal. Back - normal.  Cardiovascular Cardiovascular examination reveals -normal pedal pulses bilaterally. Note: regular rate and rhythm  Abdomen Note: unchanged. soft, non distended. He definitely has some rectus diastases, but there is a hernia defect palpable at the incision site. Skin has healed over.     Assessment & Plan Stark Klein MD; 09/14/2016 3:41 PM) VENTRAL INCISIONAL HERNIA (K43.2) Impression: Discussed lap assisted ventral hernia with mesh.  Reviewed risks and benefits including mesh infection, bowel injury, possible need for prolonged hospitalization, possible blood clot, heart or lung complications, and other.  He wishes to proceed. Scheduled in 2 weeks.    Signed by Stark Klein, MD (09/14/2016 3:41 PM)

## 2016-09-30 ENCOUNTER — Inpatient Hospital Stay (HOSPITAL_COMMUNITY)
Admission: AD | Admit: 2016-09-30 | Discharge: 2016-10-03 | DRG: 355 | Disposition: A | Discharge status: Home / Self Care | Payer: Commercial Managed Care - HMO | Source: Ambulatory Visit | Attending: General Surgery | Admitting: General Surgery

## 2016-09-30 ENCOUNTER — Encounter (HOSPITAL_COMMUNITY)
Admission: AD | Disposition: A | Discharge status: Home / Self Care | Payer: Self-pay | Source: Ambulatory Visit | Attending: General Surgery

## 2016-09-30 ENCOUNTER — Ambulatory Visit (HOSPITAL_COMMUNITY): Payer: Commercial Managed Care - HMO | Admitting: Emergency Medicine

## 2016-09-30 ENCOUNTER — Ambulatory Visit (HOSPITAL_COMMUNITY): Payer: Commercial Managed Care - HMO | Admitting: Anesthesiology

## 2016-09-30 ENCOUNTER — Encounter (HOSPITAL_COMMUNITY): Payer: Self-pay | Admitting: Anesthesiology

## 2016-09-30 DIAGNOSIS — K439 Ventral hernia without obstruction or gangrene: Secondary | ICD-10-CM | POA: Diagnosis not present

## 2016-09-30 DIAGNOSIS — K436 Other and unspecified ventral hernia with obstruction, without gangrene: Secondary | ICD-10-CM | POA: Diagnosis present

## 2016-09-30 DIAGNOSIS — Z794 Long term (current) use of insulin: Secondary | ICD-10-CM

## 2016-09-30 DIAGNOSIS — K430 Incisional hernia with obstruction, without gangrene: Principal | ICD-10-CM | POA: Diagnosis present

## 2016-09-30 DIAGNOSIS — Z7982 Long term (current) use of aspirin: Secondary | ICD-10-CM | POA: Diagnosis not present

## 2016-09-30 DIAGNOSIS — B079 Viral wart, unspecified: Secondary | ICD-10-CM | POA: Diagnosis not present

## 2016-09-30 DIAGNOSIS — Z87891 Personal history of nicotine dependence: Secondary | ICD-10-CM | POA: Diagnosis not present

## 2016-09-30 DIAGNOSIS — I10 Essential (primary) hypertension: Secondary | ICD-10-CM | POA: Diagnosis present

## 2016-09-30 DIAGNOSIS — I2510 Atherosclerotic heart disease of native coronary artery without angina pectoris: Secondary | ICD-10-CM | POA: Diagnosis present

## 2016-09-30 DIAGNOSIS — Z79899 Other long term (current) drug therapy: Secondary | ICD-10-CM

## 2016-09-30 DIAGNOSIS — E119 Type 2 diabetes mellitus without complications: Secondary | ICD-10-CM | POA: Diagnosis not present

## 2016-09-30 DIAGNOSIS — Z7984 Long term (current) use of oral hypoglycemic drugs: Secondary | ICD-10-CM

## 2016-09-30 HISTORY — PX: VENTRAL HERNIA REPAIR: SHX424

## 2016-09-30 HISTORY — PX: INSERTION OF MESH: SHX5868

## 2016-09-30 LAB — CBC
HCT: 41.1 % (ref 39.0–52.0)
Hemoglobin: 13.9 g/dL (ref 13.0–17.0)
MCH: 29.6 pg (ref 26.0–34.0)
MCHC: 33.8 g/dL (ref 30.0–36.0)
MCV: 87.4 fL (ref 78.0–100.0)
PLATELETS: 242 10*3/uL (ref 150–400)
RBC: 4.7 MIL/uL (ref 4.22–5.81)
RDW: 14.1 % (ref 11.5–15.5)
WBC: 13.3 10*3/uL — AB (ref 4.0–10.5)

## 2016-09-30 LAB — CREATININE, SERUM
CREATININE: 1.78 mg/dL — AB (ref 0.61–1.24)
GFR calc Af Amer: 42 mL/min — ABNORMAL LOW (ref >60)
GFR calc non Af Amer: 36 mL/min — ABNORMAL LOW (ref >60)

## 2016-09-30 LAB — GLUCOSE, CAPILLARY
GLUCOSE-CAPILLARY: 253 mg/dL — AB (ref 65–99)
GLUCOSE-CAPILLARY: 274 mg/dL — AB (ref 65–99)
GLUCOSE-CAPILLARY: 236 mg/dL — AB (ref 65–99)
Glucose-Capillary: 131 mg/dL — ABNORMAL HIGH (ref 65–99)
Glucose-Capillary: 227 mg/dL — ABNORMAL HIGH (ref 65–99)

## 2016-09-30 SURGERY — REPAIR, HERNIA, VENTRAL, LAPAROSCOPIC
Anesthesia: General | Site: Abdomen

## 2016-09-30 MED ORDER — INSULIN GLARGINE 100 UNIT/ML ~~LOC~~ SOLN
50.0000 [IU] | Freq: Every morning | SUBCUTANEOUS | Status: DC
  Administered 2016-10-01 – 2016-10-03 (×3): 50 [IU] via SUBCUTANEOUS
  Filled 2016-09-30 (×3): qty 0.5

## 2016-09-30 MED ORDER — ROCURONIUM BROMIDE 100 MG/10ML IV SOLN
INTRAVENOUS | Status: DC | PRN
  Administered 2016-09-30: 30 mg via INTRAVENOUS
  Administered 2016-09-30: 50 mg via INTRAVENOUS

## 2016-09-30 MED ORDER — LIDOCAINE HCL (PF) 1 % IJ SOLN
INTRAMUSCULAR | Status: DC | PRN
  Administered 2016-09-30: 4.5 mL

## 2016-09-30 MED ORDER — PROPOFOL 10 MG/ML IV BOLUS
INTRAVENOUS | Status: DC | PRN
  Administered 2016-09-30: 100 mg via INTRAVENOUS

## 2016-09-30 MED ORDER — HYDROMORPHONE HCL 2 MG/ML IJ SOLN
0.2500 mg | INTRAMUSCULAR | Status: DC | PRN
  Administered 2016-09-30 (×2): 0.5 mg via INTRAVENOUS

## 2016-09-30 MED ORDER — ONDANSETRON HCL 4 MG/2ML IJ SOLN
4.0000 mg | Freq: Four times a day (QID) | INTRAMUSCULAR | Status: DC | PRN
  Filled 2016-09-30: qty 2

## 2016-09-30 MED ORDER — GLIMEPIRIDE 4 MG PO TABS
4.0000 mg | ORAL_TABLET | Freq: Two times a day (BID) | ORAL | Status: DC
  Administered 2016-09-30 – 2016-10-03 (×6): 4 mg via ORAL
  Filled 2016-09-30 (×8): qty 1

## 2016-09-30 MED ORDER — DIPHENHYDRAMINE HCL 12.5 MG/5ML PO ELIX: 12.5000 mg | ORAL_SOLUTION | Freq: Four times a day (QID) | ORAL | Status: DC | PRN

## 2016-09-30 MED ORDER — MEPERIDINE HCL 25 MG/ML IJ SOLN: 6.2500 mg | INTRAMUSCULAR | Status: DC | PRN

## 2016-09-30 MED ORDER — SENNA 8.6 MG PO TABS
1.0000 | ORAL_TABLET | Freq: Two times a day (BID) | ORAL | Status: DC
  Administered 2016-10-01 – 2016-10-03 (×5): 8.6 mg via ORAL
  Filled 2016-09-30 (×5): qty 1

## 2016-09-30 MED ORDER — CHLORHEXIDINE GLUCONATE CLOTH 2 % EX PADS: 6.0000 | MEDICATED_PAD | Freq: Once | CUTANEOUS | Status: DC

## 2016-09-30 MED ORDER — GLUCOSAMINE CHONDROITIN MSM PO TABS: ORAL_TABLET | Freq: Every day | ORAL | Status: DC

## 2016-09-30 MED ORDER — ENOXAPARIN SODIUM 40 MG/0.4ML ~~LOC~~ SOLN
40.0000 mg | SUBCUTANEOUS | Status: DC
Start: 2016-10-01 — End: 2016-10-03
  Administered 2016-10-01 – 2016-10-03 (×3): 40 mg via SUBCUTANEOUS
  Filled 2016-09-30 (×3): qty 0.4

## 2016-09-30 MED ORDER — INSULIN GLARGINE 100 UNIT/ML ~~LOC~~ SOLN
10.0000 [IU] | Freq: Once | SUBCUTANEOUS | Status: AC
  Administered 2016-09-30: 10 [IU] via SUBCUTANEOUS
  Filled 2016-09-30 (×2): qty 0.1

## 2016-09-30 MED ORDER — LOSARTAN POTASSIUM-HCTZ 50-12.5 MG PO TABS: 1.0000 | ORAL_TABLET | Freq: Every morning | ORAL | Status: DC

## 2016-09-30 MED ORDER — FENTANYL CITRATE (PF) 100 MCG/2ML IJ SOLN
INTRAMUSCULAR | Status: DC | PRN
  Administered 2016-09-30: 50 ug via INTRAVENOUS
  Administered 2016-09-30: 200 ug via INTRAVENOUS

## 2016-09-30 MED ORDER — DIPHENHYDRAMINE HCL 50 MG/ML IJ SOLN: 12.5000 mg | Freq: Four times a day (QID) | INTRAMUSCULAR | Status: DC | PRN

## 2016-09-30 MED ORDER — EPHEDRINE 5 MG/ML INJ
INTRAVENOUS | Status: AC
  Filled 2016-09-30: qty 10

## 2016-09-30 MED ORDER — MIDAZOLAM HCL 2 MG/2ML IJ SOLN
INTRAMUSCULAR | Status: AC
  Filled 2016-09-30: qty 2

## 2016-09-30 MED ORDER — ATORVASTATIN CALCIUM 40 MG PO TABS
40.0000 mg | ORAL_TABLET | Freq: Every day | ORAL | Status: DC
  Administered 2016-10-01 – 2016-10-03 (×3): 40 mg via ORAL
  Filled 2016-09-30 (×3): qty 1

## 2016-09-30 MED ORDER — FENOFIBRATE 160 MG PO TABS
160.0000 mg | ORAL_TABLET | Freq: Every day | ORAL | Status: DC
  Administered 2016-10-01 – 2016-10-03 (×3): 160 mg via ORAL
  Filled 2016-09-30 (×3): qty 1

## 2016-09-30 MED ORDER — NAPHAZOLINE-GLYCERIN 0.012-0.2 % OP SOLN: 1.0000 [drp] | Freq: Four times a day (QID) | OPHTHALMIC | Status: DC | PRN

## 2016-09-30 MED ORDER — ATENOLOL 25 MG PO TABS
25.0000 mg | ORAL_TABLET | Freq: Every morning | ORAL | Status: DC
  Administered 2016-10-01 – 2016-10-03 (×3): 25 mg via ORAL
  Filled 2016-09-30 (×3): qty 1

## 2016-09-30 MED ORDER — PHENYLEPHRINE HCL 10 MG/ML IJ SOLN
INTRAMUSCULAR | Status: DC | PRN
  Administered 2016-09-30 (×3): 80 ug via INTRAVENOUS

## 2016-09-30 MED ORDER — AMMONIUM LACTATE 12 % EX LOTN
1.0000 "application " | TOPICAL_LOTION | Freq: Every day | CUTANEOUS | Status: DC
  Administered 2016-09-30 – 2016-10-03 (×4): 1 via TOPICAL
  Filled 2016-09-30: qty 400

## 2016-09-30 MED ORDER — LACTATED RINGERS IV SOLN
INTRAVENOUS | Status: DC | PRN
  Administered 2016-09-30 (×2): via INTRAVENOUS

## 2016-09-30 MED ORDER — ROCURONIUM BROMIDE 10 MG/ML (PF) SYRINGE
PREFILLED_SYRINGE | INTRAVENOUS | Status: AC
  Filled 2016-09-30: qty 10

## 2016-09-30 MED ORDER — OXYCODONE HCL 5 MG PO TABS
5.0000 mg | ORAL_TABLET | ORAL | Status: DC | PRN
Start: 2016-09-30 — End: 2016-10-03
  Administered 2016-10-01 – 2016-10-03 (×5): 10 mg via ORAL
  Filled 2016-09-30 (×5): qty 2

## 2016-09-30 MED ORDER — FENTANYL CITRATE (PF) 100 MCG/2ML IJ SOLN
INTRAMUSCULAR | Status: AC
Start: 2016-09-30 — End: 2016-09-30
  Filled 2016-09-30: qty 2

## 2016-09-30 MED ORDER — MORPHINE SULFATE (PF) 2 MG/ML IV SOLN: 1.0000 mg | INTRAVENOUS | Status: DC | PRN

## 2016-09-30 MED ORDER — INSULIN ASPART 100 UNIT/ML ~~LOC~~ SOLN
SUBCUTANEOUS | Status: AC
  Filled 2016-09-30: qty 1

## 2016-09-30 MED ORDER — ACETAMINOPHEN 325 MG PO TABS
650.0000 mg | ORAL_TABLET | Freq: Four times a day (QID) | ORAL | Status: DC | PRN
  Administered 2016-10-02 (×2): 650 mg via ORAL
  Filled 2016-09-30 (×2): qty 2

## 2016-09-30 MED ORDER — MORPHINE SULFATE 2 MG/ML IV SOLN
INTRAVENOUS | Status: DC
  Administered 2016-09-30: 1.5 mg via INTRAVENOUS
  Administered 2016-09-30: 15 mg via INTRAVENOUS
  Administered 2016-09-30: 9 mg via INTRAVENOUS
  Administered 2016-09-30: 12 mg via INTRAVENOUS
  Administered 2016-10-01: 4.5 mg via INTRAVENOUS
  Administered 2016-10-01: 13.5 mg via INTRAVENOUS
  Administered 2016-10-01: 1.5 mg via INTRAVENOUS
  Administered 2016-10-01: 9 mg via INTRAVENOUS
  Administered 2016-10-01: 02:00:00 via INTRAVENOUS
  Administered 2016-10-01: 16.5 mg via INTRAVENOUS
  Administered 2016-10-02: 7.5 mg via INTRAVENOUS
  Administered 2016-10-02: 4.5 mg via INTRAVENOUS
  Administered 2016-10-02: 6 mg via INTRAVENOUS
  Filled 2016-09-30 (×2): qty 25

## 2016-09-30 MED ORDER — ONDANSETRON 4 MG PO TBDP: 4.0000 mg | ORAL_TABLET | Freq: Four times a day (QID) | ORAL | Status: DC | PRN

## 2016-09-30 MED ORDER — ONDANSETRON HCL 4 MG/2ML IJ SOLN
INTRAMUSCULAR | Status: DC | PRN
  Administered 2016-09-30: 4 mg via INTRAVENOUS

## 2016-09-30 MED ORDER — NALOXONE HCL 0.4 MG/ML IJ SOLN: 0.4000 mg | INTRAMUSCULAR | Status: DC | PRN

## 2016-09-30 MED ORDER — PROPOFOL 10 MG/ML IV BOLUS
INTRAVENOUS | Status: AC
  Filled 2016-09-30: qty 20

## 2016-09-30 MED ORDER — LIDOCAINE 2% (20 MG/ML) 5 ML SYRINGE
INTRAMUSCULAR | Status: AC
  Filled 2016-09-30: qty 5

## 2016-09-30 MED ORDER — BUPIVACAINE-EPINEPHRINE 0.25% -1:200000 IJ SOLN
INTRAMUSCULAR | Status: AC
  Filled 2016-09-30: qty 1

## 2016-09-30 MED ORDER — LOSARTAN POTASSIUM 50 MG PO TABS
50.0000 mg | ORAL_TABLET | Freq: Every day | ORAL | Status: DC
  Administered 2016-10-01 – 2016-10-03 (×3): 50 mg via ORAL
  Filled 2016-09-30 (×3): qty 1

## 2016-09-30 MED ORDER — INSULIN ASPART 100 UNIT/ML ~~LOC~~ SOLN
3.0000 [IU] | Freq: Every day | SUBCUTANEOUS | Status: DC
  Administered 2016-09-30 – 2016-10-02 (×2): 3 [IU] via SUBCUTANEOUS

## 2016-09-30 MED ORDER — PHENYLEPHRINE HCL 10 MG/ML IJ SOLN
INTRAMUSCULAR | Status: DC | PRN
  Administered 2016-09-30: 50 ug/min via INTRAVENOUS

## 2016-09-30 MED ORDER — INSULIN ASPART 100 UNIT/ML ~~LOC~~ SOLN: 3.0000 [IU] | Freq: Two times a day (BID) | SUBCUTANEOUS | Status: DC

## 2016-09-30 MED ORDER — SIMETHICONE 80 MG PO CHEW: 40.0000 mg | CHEWABLE_TABLET | Freq: Four times a day (QID) | ORAL | Status: DC | PRN

## 2016-09-30 MED ORDER — BUPIVACAINE-EPINEPHRINE 0.25% -1:200000 IJ SOLN
INTRAMUSCULAR | Status: DC | PRN
  Administered 2016-09-30: 4.5 mL
  Administered 2016-09-30: 20 mL

## 2016-09-30 MED ORDER — PHENYLEPHRINE 40 MCG/ML (10ML) SYRINGE FOR IV PUSH (FOR BLOOD PRESSURE SUPPORT)
PREFILLED_SYRINGE | INTRAVENOUS | Status: AC
  Filled 2016-09-30: qty 10

## 2016-09-30 MED ORDER — ACETAMINOPHEN 500 MG PO TABS
1000.0000 mg | ORAL_TABLET | ORAL | Status: AC
  Administered 2016-09-30: 1000 mg via ORAL
  Filled 2016-09-30: qty 2

## 2016-09-30 MED ORDER — HYDRALAZINE HCL 20 MG/ML IJ SOLN: 10.0000 mg | INTRAMUSCULAR | Status: DC | PRN

## 2016-09-30 MED ORDER — ZOLPIDEM TARTRATE 5 MG PO TABS: 5.0000 mg | ORAL_TABLET | Freq: Every evening | ORAL | Status: DC | PRN

## 2016-09-30 MED ORDER — 0.9 % SODIUM CHLORIDE (POUR BTL) OPTIME
TOPICAL | Status: DC | PRN
  Administered 2016-09-30: 1000 mL

## 2016-09-30 MED ORDER — INSULIN ASPART 100 UNIT/ML ~~LOC~~ SOLN
0.0000 [IU] | Freq: Three times a day (TID) | SUBCUTANEOUS | Status: DC
  Administered 2016-09-30: 5 [IU] via SUBCUTANEOUS
  Administered 2016-10-01: 8 [IU] via SUBCUTANEOUS
  Administered 2016-10-01 (×2): 5 [IU] via SUBCUTANEOUS
  Administered 2016-10-02: 3 [IU] via SUBCUTANEOUS
  Administered 2016-10-02 – 2016-10-03 (×3): 5 [IU] via SUBCUTANEOUS
  Administered 2016-10-03: 3 [IU] via SUBCUTANEOUS

## 2016-09-30 MED ORDER — HYDROCHLOROTHIAZIDE 12.5 MG PO CAPS
12.5000 mg | ORAL_CAPSULE | Freq: Every day | ORAL | Status: DC
  Administered 2016-10-01 – 2016-10-03 (×3): 12.5 mg via ORAL
  Filled 2016-09-30 (×3): qty 1

## 2016-09-30 MED ORDER — LACTATED RINGERS IV SOLN: INTRAVENOUS | Status: DC

## 2016-09-30 MED ORDER — NEOSTIGMINE METHYLSULFATE 10 MG/10ML IV SOLN
INTRAVENOUS | Status: DC | PRN
  Administered 2016-09-30: 3 mg via INTRAVENOUS

## 2016-09-30 MED ORDER — METHOCARBAMOL 500 MG PO TABS
500.0000 mg | ORAL_TABLET | Freq: Four times a day (QID) | ORAL | Status: DC | PRN
Start: 2016-09-30 — End: 2016-10-03
  Administered 2016-09-30 – 2016-10-03 (×7): 500 mg via ORAL
  Filled 2016-09-30 (×7): qty 1

## 2016-09-30 MED ORDER — ACETAMINOPHEN 650 MG RE SUPP: 650.0000 mg | Freq: Four times a day (QID) | RECTAL | Status: DC | PRN

## 2016-09-30 MED ORDER — CIPROFLOXACIN IN D5W 400 MG/200ML IV SOLN
400.0000 mg | Freq: Two times a day (BID) | INTRAVENOUS | Status: AC
  Administered 2016-09-30: 400 mg via INTRAVENOUS
  Filled 2016-09-30: qty 200

## 2016-09-30 MED ORDER — CIPROFLOXACIN IN D5W 400 MG/200ML IV SOLN
400.0000 mg | INTRAVENOUS | Status: AC
  Administered 2016-09-30: 400 mg via INTRAVENOUS
  Filled 2016-09-30: qty 200

## 2016-09-30 MED ORDER — PHENYLEPHRINE HCL 10 MG/ML IJ SOLN
INTRAMUSCULAR | Status: AC
  Filled 2016-09-30: qty 1

## 2016-09-30 MED ORDER — FENTANYL CITRATE (PF) 100 MCG/2ML IJ SOLN
INTRAMUSCULAR | Status: AC
  Filled 2016-09-30: qty 2

## 2016-09-30 MED ORDER — KCL IN DEXTROSE-NACL 20-5-0.45 MEQ/L-%-% IV SOLN
INTRAVENOUS | Status: DC
  Administered 2016-09-30 – 2016-10-02 (×4): via INTRAVENOUS
  Filled 2016-09-30 (×4): qty 1000

## 2016-09-30 MED ORDER — LIDOCAINE HCL (CARDIAC) 20 MG/ML IV SOLN
INTRAVENOUS | Status: DC | PRN
  Administered 2016-09-30: 100 mg via INTRAVENOUS

## 2016-09-30 MED ORDER — GLYCOPYRROLATE 0.2 MG/ML IJ SOLN
INTRAMUSCULAR | Status: DC | PRN
  Administered 2016-09-30: 0.4 mg via INTRAVENOUS

## 2016-09-30 MED ORDER — LIDOCAINE HCL (PF) 1 % IJ SOLN
INTRAMUSCULAR | Status: AC
  Filled 2016-09-30: qty 30

## 2016-09-30 MED ORDER — ONDANSETRON HCL 4 MG/2ML IJ SOLN: 4.0000 mg | Freq: Once | INTRAMUSCULAR | Status: DC | PRN

## 2016-09-30 MED ORDER — INSULIN ASPART 100 UNIT/ML ~~LOC~~ SOLN
5.0000 [IU] | Freq: Once | SUBCUTANEOUS | Status: AC
  Administered 2016-09-30: 5 [IU] via SUBCUTANEOUS

## 2016-09-30 MED ORDER — SODIUM CHLORIDE 0.9% FLUSH: 9.0000 mL | INTRAVENOUS | Status: DC | PRN

## 2016-09-30 MED ORDER — INSULIN ASPART 100 UNIT/ML ~~LOC~~ SOLN
12.0000 [IU] | Freq: Every day | SUBCUTANEOUS | Status: DC
  Administered 2016-10-02 – 2016-10-03 (×2): 12 [IU] via SUBCUTANEOUS

## 2016-09-30 MED ORDER — MORPHINE SULFATE 2 MG/ML IV SOLN
INTRAVENOUS | Status: AC
  Filled 2016-09-30: qty 25

## 2016-09-30 MED ORDER — ONDANSETRON HCL 4 MG/2ML IJ SOLN: 4.0000 mg | Freq: Four times a day (QID) | INTRAMUSCULAR | Status: DC | PRN

## 2016-09-30 MED ORDER — EPHEDRINE SULFATE 50 MG/ML IJ SOLN
INTRAMUSCULAR | Status: DC | PRN
  Administered 2016-09-30: 10 mg via INTRAVENOUS

## 2016-09-30 MED ORDER — HYDROMORPHONE HCL 2 MG/ML IJ SOLN
INTRAMUSCULAR | Status: AC
  Filled 2016-09-30: qty 1

## 2016-09-30 SURGICAL SUPPLY — 55 items
APPLIER CLIP 5 13 M/L LIGAMAX5 (MISCELLANEOUS)
BINDER ABDOMINAL 12 ML 46-62 (SOFTGOODS) ×2 IMPLANT
BLADE SURG 10 STRL SS (BLADE) ×2 IMPLANT
BLADE SURG ROTATE 9660 (MISCELLANEOUS) ×2 IMPLANT
CANISTER SUCTION 2500CC (MISCELLANEOUS) ×2 IMPLANT
CHLORAPREP W/TINT 26ML (MISCELLANEOUS) ×2 IMPLANT
CLIP APPLIE 5 13 M/L LIGAMAX5 (MISCELLANEOUS) IMPLANT
COVER SURGICAL LIGHT HANDLE (MISCELLANEOUS) ×2 IMPLANT
DECANTER SPIKE VIAL GLASS SM (MISCELLANEOUS) ×2 IMPLANT
DERMABOND ADVANCED (GAUZE/BANDAGES/DRESSINGS) ×1
DERMABOND ADVANCED .7 DNX12 (GAUZE/BANDAGES/DRESSINGS) ×1 IMPLANT
DEVICE SECURE STRAP 25 ABSORB (INSTRUMENTS) ×4 IMPLANT
DEVICE TROCAR PUNCTURE CLOSURE (ENDOMECHANICALS) ×2 IMPLANT
DRAPE LAPAROSCOPIC ABDOMINAL (DRAPES) ×2 IMPLANT
DRAPE WARM FLUID 44X44 (DRAPE) ×2 IMPLANT
ELECT CAUTERY BLADE 6.4 (BLADE) ×2 IMPLANT
ELECT REM PT RETURN 9FT ADLT (ELECTROSURGICAL) ×2
ELECTRODE REM PT RTRN 9FT ADLT (ELECTROSURGICAL) ×1 IMPLANT
GAUZE SPONGE 4X4 12PLY STRL (GAUZE/BANDAGES/DRESSINGS) ×2 IMPLANT
GLOVE BIO SURGEON STRL SZ 6 (GLOVE) ×2 IMPLANT
GLOVE BIOGEL PI IND STRL 6.5 (GLOVE) ×1 IMPLANT
GLOVE BIOGEL PI IND STRL 7.0 (GLOVE) ×1 IMPLANT
GLOVE BIOGEL PI INDICATOR 6.5 (GLOVE) ×1
GLOVE BIOGEL PI INDICATOR 7.0 (GLOVE) ×1
GLOVE SURG SS PI 6.5 STRL IVOR (GLOVE) ×2 IMPLANT
GOWN STRL REUS W/ TWL LRG LVL3 (GOWN DISPOSABLE) ×2 IMPLANT
GOWN STRL REUS W/TWL 2XL LVL3 (GOWN DISPOSABLE) ×2 IMPLANT
GOWN STRL REUS W/TWL LRG LVL3 (GOWN DISPOSABLE) ×2
KIT BASIN OR (CUSTOM PROCEDURE TRAY) ×2 IMPLANT
KIT ROOM TURNOVER OR (KITS) ×2 IMPLANT
L-HOOK LAP DISP 36CM (ELECTROSURGICAL) ×2
LHOOK LAP DISP 36CM (ELECTROSURGICAL) ×1 IMPLANT
MARKER SKIN DUAL TIP RULER LAB (MISCELLANEOUS) ×2 IMPLANT
MESH VENTRALIGHT ST 6X8 (Mesh Specialty) ×1 IMPLANT
MESH VENTRLGHT ELLIPSE 8X6XMFL (Mesh Specialty) ×1 IMPLANT
NEEDLE SPNL 18GX3.5 QUINCKE PK (NEEDLE) ×2 IMPLANT
NS IRRIG 1000ML POUR BTL (IV SOLUTION) ×2 IMPLANT
PENCIL BUTTON HOLSTER BLD 10FT (ELECTRODE) ×2 IMPLANT
SCALPEL HARMONIC ACE (MISCELLANEOUS) ×2 IMPLANT
SCISSORS LAP 5X35 DISP (ENDOMECHANICALS) ×2 IMPLANT
SET IRRIG TUBING LAPAROSCOPIC (IRRIGATION / IRRIGATOR) IMPLANT
SLEEVE ENDOPATH XCEL 5M (ENDOMECHANICALS) ×4 IMPLANT
SPONGE LAP 18X18 X RAY DECT (DISPOSABLE) ×2 IMPLANT
SUT MON AB 4-0 PC3 18 (SUTURE) ×2 IMPLANT
SUT NOVA NAB DX-16 0-1 5-0 T12 (SUTURE) ×8 IMPLANT
TAPE CLOTH SURG 4X10 WHT LF (GAUZE/BANDAGES/DRESSINGS) ×2 IMPLANT
TOWEL OR 17X24 6PK STRL BLUE (TOWEL DISPOSABLE) ×2 IMPLANT
TRAY FOLEY CATH 16FR SILVER (SET/KITS/TRAYS/PACK) ×2 IMPLANT
TRAY FOLEY CATH SILVER 16FR (SET/KITS/TRAYS/PACK) ×2 IMPLANT
TRAY LAPAROSCOPIC MC (CUSTOM PROCEDURE TRAY) ×2 IMPLANT
TROCAR XCEL NON-BLD 11X100MML (ENDOMECHANICALS) ×2 IMPLANT
TROCAR XCEL NON-BLD 5MMX100MML (ENDOMECHANICALS) ×2 IMPLANT
TUBE CONNECTING 12X1/4 (SUCTIONS) ×2 IMPLANT
TUBING INSUFFLATION (TUBING) ×2 IMPLANT
YANKAUER SUCT BULB TIP NO VENT (SUCTIONS) ×2 IMPLANT

## 2016-09-30 NOTE — Op Note (Signed)
Laparoscopic Assisted Ventral Hernia Repair with mesh  Indications: Symptomatic ventral incisional hernia   Pre-operative Diagnosis:  Ventral incisional hernia   Post-operative Diagnosis: Incarcerated ventral incisional hernia   Surgeon: Stark Klein   Assistants: Cedric Fishman, RNFA  Anesthesia: General endotracheal anesthesia and Local anesthesia 1% buffered lidocaine, 0.25.% bupivacaine, with epinephrine   ASA Class: 2  Procedure Details   The patient was seen in the Holding Room. The risks, benefits, complications, treatment options, and expected outcomes were discussed with the patient. The possibilities of reaction to medication, pulmonary aspiration, perforation of viscus, bleeding, recurrent infection, the need for additional procedures, failure to diagnose a condition, and creating a complication requiring transfusion or operation were discussed with the patient. The patient concurred with the proposed plan, giving informed consent. The site of surgery properly noted/marked. The patient was taken to the operating room, identified, and the procedure verified as ventral hernia repair. A Time Out was held and the above information confirmed.   The patient was placed supine. After establishing general anesthesia, the abdomen was prepped with Chloraprep and draped in sterile fashion. The patient was placed in to reverse trendelenberg position and rotated to the right. The abdomen was accessed with a 5 mm Optiview port without complication. Pneumoperitoneum was achieved to a pressure of 15 mm of mercury. 2 additional ports are placed on the left side of the abdomen after administration of local anesthetic.   The ventral hernia was seen and there was omentum incarcerated in. This was pulled down easily with the locking graspers and sharp dissection. The hernia defect was measured. This is approximately 5 x 8 cm.  The incision was opened.  The skin was dissected away from the fascia with the  cautery.  The Ventralite ST 10x15 cm was selected.  A transfascial tacking suture was placed 3-4 cm above the defect and the mesh was placed in the peritoneal cavity.  The fascia was closed with #1 novofil interrupted sutures.  Pneumoperitoneum was reestablished.    The mesh was tacked into place with the SecureStrap tacker.  The mesh was examined and seen to have good lie. It was flat without evidence of wrinkling or stretch.    The pneumoperitoneum was allowed to evacuate. This was done after making sure there was no pooling of blood or succus in the abdomen.  The excess skin was excised with the scalpel.  The skin was irrigated.  The incisions were closed with staples. The wounds were cleaned, dried, and dressed with dry dressings.  An abdominal binder was then placed.    The patient was awakened from anesthesia and taken to the [facility] in stable condition. Needle sponge and instrument counts were correct x2.   Findings:  Incarcerated omentum  Estimated Blood Loss: Minimal   Drains: none   Complications: None; patient tolerated the procedure well.   Disposition: PACU - hemodynamically stable.   Condition: stable

## 2016-09-30 NOTE — Anesthesia Postprocedure Evaluation (Signed)
Anesthesia Post Note  Patient: Maurice Perkins  Procedure(s) Performed: Procedure(s) (LRB): LAPAROSCOPIC VENTRAL HERNIA (N/A) INSERTION OF MESH (N/A)  Patient location during evaluation: PACU Anesthesia Type: General Level of consciousness: awake and alert Pain management: pain level controlled Vital Signs Assessment: post-procedure vital signs reviewed and stable Respiratory status: spontaneous breathing, nonlabored ventilation, respiratory function stable and patient connected to nasal cannula oxygen Cardiovascular status: blood pressure returned to baseline and stable Postop Assessment: no signs of nausea or vomiting Anesthetic complications: no    Last Vitals:  Vitals:   09/30/16 1303 09/30/16 1315  BP: 122/66   Pulse: (!) 51 (!) 53  Resp: 18 14  Temp:      Last Pain:  Vitals:   09/30/16 1300  TempSrc:   PainSc: 7                  Sansa Alkema DAVID

## 2016-09-30 NOTE — Transfer of Care (Signed)
Immediate Anesthesia Transfer of Care Note  Patient: Maurice Perkins  Procedure(s) Performed: Procedure(s): LAPAROSCOPIC VENTRAL HERNIA (N/A) INSERTION OF MESH (N/A)  Patient Location: PACU  Anesthesia Type:General  Level of Consciousness: awake  Airway & Oxygen Therapy: Patient Spontanous Breathing  Post-op Assessment: Report given to RN and Post -op Vital signs reviewed and stable  Post vital signs: Reviewed and stable  Last Vitals:  Vitals:   09/30/16 0759  BP: (!) 120/52  Pulse: (!) 55  Resp: 20  Temp: 36.8 C    Last Pain:  Vitals:   09/30/16 0759  TempSrc: Oral      Patients Stated Pain Goal: 3 ([id number])  Complications: No apparent anesthesia complications

## 2016-09-30 NOTE — Anesthesia Preprocedure Evaluation (Signed)
Anesthesia Evaluation  Patient identified by MRN, date of birth, ID band Patient awake    Reviewed: Allergy & Precautions, NPO status , Patient's Chart, lab work & pertinent test results  Airway Mallampati: I  TM Distance: >3 FB Neck ROM: Full    Dental   Pulmonary former smoker,    Pulmonary exam normal        Cardiovascular hypertension, Pt. on medications + CAD  Normal cardiovascular exam     Neuro/Psych    GI/Hepatic   Endo/Other  diabetes, Well Controlled, Type 2, Insulin Dependent, Oral Hypoglycemic Agents  Renal/GU      Musculoskeletal   Abdominal   Peds  Hematology   Anesthesia Other Findings   Reproductive/Obstetrics                             Anesthesia Physical Anesthesia Plan  ASA: II  Anesthesia Plan: General   Post-op Pain Management:    Induction: Intravenous  Airway Management Planned: Oral ETT  Additional Equipment:   Intra-op Plan:   Post-operative Plan: Extubation in OR  Informed Consent: I have reviewed the patients History and Physical, chart, labs and discussed the procedure including the risks, benefits and alternatives for the proposed anesthesia with the patient or authorized representative who has indicated his/her understanding and acceptance.     Plan Discussed with: CRNA and Surgeon  Anesthesia Plan Comments:         Anesthesia Quick Evaluation

## 2016-09-30 NOTE — Anesthesia Procedure Notes (Signed)
Procedure Name: Intubation Date/Time: 09/30/2016 9:06 AM Performed by: Manus Gunning, Lennan Malone J Pre-anesthesia Checklist: Patient identified, Emergency Drugs available, Suction available, Patient being monitored and Timeout performed Patient Re-evaluated:Patient Re-evaluated prior to inductionOxygen Delivery Method: Circle system utilized Preoxygenation: Pre-oxygenation with 100% oxygen Intubation Type: IV induction Ventilation: Mask ventilation without difficulty Laryngoscope Size: Mac and 4 Grade View: Grade II Tube type: Oral Tube size: 7.5 mm Number of attempts: 1 Placement Confirmation: ETT inserted through vocal cords under direct vision,  positive ETCO2 and breath sounds checked- equal and bilateral Secured at: 21 cm Tube secured with: Tape Dental Injury: Teeth and Oropharynx as per pre-operative assessment

## 2016-09-30 NOTE — Interval H&P Note (Signed)
History and Physical Interval Note:  09/30/2016 9:01 AM  Maurice Perkins  has presented today for surgery, with the diagnosis of reductable hernia  The various methods of treatment have been discussed with the patient and family. After consideration of risks, benefits and other options for treatment, the patient has consented to  Procedure(s): LAPAROSCOPIC VENTRAL HERNIA (N/A) INSERTION OF MESH (N/A) as a surgical intervention .  The patient's history has been reviewed, patient examined, no change in status, stable for surgery.  I have reviewed the patient's chart and labs.  Questions were answered to the patient's satisfaction.     Vinette Crites

## 2016-10-01 ENCOUNTER — Encounter (HOSPITAL_COMMUNITY): Payer: Self-pay | Admitting: General Surgery

## 2016-10-01 LAB — BASIC METABOLIC PANEL
ANION GAP: 10 (ref 5–15)
BUN: 22 mg/dL — ABNORMAL HIGH (ref 6–20)
CHLORIDE: 99 mmol/L — AB (ref 101–111)
CO2: 26 mmol/L (ref 22–32)
Calcium: 8.5 mg/dL — ABNORMAL LOW (ref 8.9–10.3)
Creatinine, Ser: 1.59 mg/dL — ABNORMAL HIGH (ref 0.61–1.24)
GFR calc non Af Amer: 42 mL/min — ABNORMAL LOW (ref >60)
GFR, EST AFRICAN AMERICAN: 48 mL/min — AB (ref >60)
Glucose, Bld: 225 mg/dL — ABNORMAL HIGH (ref 65–99)
Potassium: 4 mmol/L (ref 3.5–5.1)
SODIUM: 135 mmol/L (ref 135–145)

## 2016-10-01 LAB — GLUCOSE, CAPILLARY
GLUCOSE-CAPILLARY: 256 mg/dL — AB (ref 65–99)
GLUCOSE-CAPILLARY: 231 mg/dL — AB (ref 65–99)
Glucose-Capillary: 250 mg/dL — ABNORMAL HIGH (ref 65–99)
Glucose-Capillary: 162 mg/dL — ABNORMAL HIGH (ref 65–99)

## 2016-10-01 LAB — CBC
HEMATOCRIT: 40.8 % (ref 39.0–52.0)
HEMOGLOBIN: 13.7 g/dL (ref 13.0–17.0)
MCH: 29.5 pg (ref 26.0–34.0)
MCHC: 33.6 g/dL (ref 30.0–36.0)
MCV: 87.9 fL (ref 78.0–100.0)
Platelets: 224 10*3/uL (ref 150–400)
RBC: 4.64 MIL/uL (ref 4.22–5.81)
RDW: 14.5 % (ref 11.5–15.5)
WBC: 11.6 10*3/uL — AB (ref 4.0–10.5)

## 2016-10-01 MED ORDER — PROMETHAZINE HCL 25 MG/ML IJ SOLN
12.5000 mg | Freq: Four times a day (QID) | INTRAMUSCULAR | Status: DC | PRN
  Administered 2016-10-01: 12.5 mg via INTRAVENOUS
  Filled 2016-10-01: qty 1

## 2016-10-01 NOTE — Progress Notes (Signed)
1 Day Post-Op  Subjective: Pain an issue  No vomiting  Small flatus   Objective: Vital signs in last 24 hours: Temp:  [97.7 F (36.5 C)-99 F (37.2 C)] 98.4 F (36.9 C) (10/19 0512) Pulse Rate:  [43-80] 72 (10/19 0512) Resp:  [14-20] 18 (10/19 0512) BP: (114-155)/(52-88) 155/76 (10/19 0512) SpO2:  [93 %-98 %] 93 % (10/19 0512) FiO2 (%):  [98 %] 98 % (10/19 0217) Weight:  [98.9 kg (218 lb)-103.9 kg (229 lb)] 103.9 kg (229 lb) (10/18 1411) Last BM Date: 09/29/16  Intake/Output from previous day: 10/18 0701 - 10/19 0700 In: 2521.7 [P.O.:120; I.V.:2401.7] Out: 875 [Urine:850; Blood:25] Intake/Output this shift: No intake/output data recorded.  Incision/Wound:dressings dry  Soft very sore no peritonitis   Lab Results:   Recent Labs  09/30/16 1426 10/01/16 0441  WBC 13.3* 11.6*  HGB 13.9 13.7  HCT 41.1 40.8  PLT 242 224   BMET  Recent Labs  09/30/16 1426 10/01/16 0441  NA  --  135  K  --  4.0  CL  --  99*  CO2  --  26  GLUCOSE  --  225*  BUN  --  22*  CREATININE 1.78* 1.59*  CALCIUM  --  8.5*   PT/INR No results for input(s): LABPROT, INR in the last 72 hours. ABG No results for input(s): PHART, HCO3 in the last 72 hours.  Invalid input(s): PCO2, PO2  Studies/Results: No results found.  Anti-infectives: Anti-infectives    Start     Dose/Rate Route Frequency Ordered Stop   09/30/16 2000  ciprofloxacin (CIPRO) IVPB 400 mg     400 mg 200 mL/hr over 60 Minutes Intravenous Every 12 hours 09/30/16 1227 09/30/16 2059   09/30/16 0750  ciprofloxacin (CIPRO) IVPB 400 mg     400 mg 200 mL/hr over 60 Minutes Intravenous On call to O.R. 09/30/16 0750 09/30/16 0858      Assessment/Plan: s/p Procedure(s): LAPAROSCOPIC VENTRAL HERNIA (N/A) INSERTION OF MESH (N/A) OOB Adv diet Continue PCA   LOS: 1 day    Cliffard Hair A. 10/01/2016

## 2016-10-02 LAB — CBC
HCT: 39.7 % (ref 39.0–52.0)
HEMOGLOBIN: 13 g/dL (ref 13.0–17.0)
MCH: 29.1 pg (ref 26.0–34.0)
MCHC: 32.7 g/dL (ref 30.0–36.0)
MCV: 89 fL (ref 78.0–100.0)
Platelets: 207 10*3/uL (ref 150–400)
RBC: 4.46 MIL/uL (ref 4.22–5.81)
RDW: 14.5 % (ref 11.5–15.5)
WBC: 9.3 10*3/uL (ref 4.0–10.5)

## 2016-10-02 LAB — BASIC METABOLIC PANEL
ANION GAP: 6 (ref 5–15)
BUN: 20 mg/dL (ref 6–20)
CHLORIDE: 100 mmol/L — AB (ref 101–111)
CO2: 29 mmol/L (ref 22–32)
Calcium: 8.3 mg/dL — ABNORMAL LOW (ref 8.9–10.3)
Creatinine, Ser: 1.77 mg/dL — ABNORMAL HIGH (ref 0.61–1.24)
GFR calc Af Amer: 42 mL/min — ABNORMAL LOW (ref >60)
GFR, EST NON AFRICAN AMERICAN: 37 mL/min — AB (ref >60)
GLUCOSE: 198 mg/dL — AB (ref 65–99)
POTASSIUM: 3.7 mmol/L (ref 3.5–5.1)
SODIUM: 135 mmol/L (ref 135–145)

## 2016-10-02 LAB — GLUCOSE, CAPILLARY
GLUCOSE-CAPILLARY: 200 mg/dL — AB (ref 65–99)
GLUCOSE-CAPILLARY: 212 mg/dL — AB (ref 65–99)
GLUCOSE-CAPILLARY: 204 mg/dL — AB (ref 65–99)
Glucose-Capillary: 214 mg/dL — ABNORMAL HIGH (ref 65–99)

## 2016-10-02 MED ORDER — HYDROMORPHONE HCL 2 MG/ML IJ SOLN: 1.0000 mg | INTRAMUSCULAR | Status: DC | PRN

## 2016-10-02 NOTE — Progress Notes (Signed)
2 Days Post-Op  Subjective: Feels better today  Less nausea   Objective: Vital signs in last 24 hours: Temp:  [98.2 F (36.8 C)-99.8 F (37.7 C)] 99.2 F (37.3 C) (10/20 0819) Pulse Rate:  [70-83] 74 (10/20 0819) Resp:  [12-26] 18 (10/20 0819) BP: (108-123)/(57-68) 123/68 (10/20 0819) SpO2:  [92 %-96 %] 95 % (10/20 0819) Last BM Date: 09/29/16  Intake/Output from previous day: 10/19 0701 - 10/20 0700 In: 2395.8 [P.O.:180; I.V.:2215.8] Out: 1950 [Urine:1950] Intake/Output this shift: No intake/output data recorded.  Incision/Wound:dry dressings   Soft ND BS present   Lab Results:   Recent Labs  10/01/16 0441 10/02/16 0727  WBC 11.6* 9.3  HGB 13.7 13.0  HCT 40.8 39.7  PLT 224 207   BMET  Recent Labs  10/01/16 0441 10/02/16 0727  NA 135 135  K 4.0 3.7  CL 99* 100*  CO2 26 29  GLUCOSE 225* 198*  BUN 22* 20  CREATININE 1.59* 1.77*  CALCIUM 8.5* 8.3*   PT/INR No results for input(s): LABPROT, INR in the last 72 hours. ABG No results for input(s): PHART, HCO3 in the last 72 hours.  Invalid input(s): PCO2, PO2  Studies/Results: No results found.  Anti-infectives: Anti-infectives    Start     Dose/Rate Route Frequency Ordered Stop   09/30/16 2000  ciprofloxacin (CIPRO) IVPB 400 mg     400 mg 200 mL/hr over 60 Minutes Intravenous Every 12 hours 09/30/16 1227 09/30/16 2059   09/30/16 0750  ciprofloxacin (CIPRO) IVPB 400 mg     400 mg 200 mL/hr over 60 Minutes Intravenous On call to O.R. 09/30/16 0750 09/30/16 0858      Assessment/Plan: s/p Procedure(s): LAPAROSCOPIC VENTRAL HERNIA (N/A) INSERTION OF MESH (N/A) Plan for discharge tomorrow  LOS: 2 days    Gus Littler A. 10/02/2016

## 2016-10-02 NOTE — Progress Notes (Signed)
Inpatient Diabetes Program Recommendations  AACE/ADA: New Consensus Statement on Inpatient Glycemic Control (2015)  Target Ranges:  Prepandial:   less than 140 mg/dL      Peak postprandial:   less than 180 mg/dL (1-2 hours)      Critically ill patients:  140 - 180 mg/dL   Lab Results  Component Value Date   GLUCAP 214 (H) 10/02/2016   HGBA1C 8.3 (H) 09/22/2016    Review of Glycemic Control  Results for ARGIL, HANDLEY (MRN [id number]) as of 10/02/2016 14:06  Ref. Range 10/01/2016 12:14 10/01/2016 16:26 10/01/2016 21:42 10/02/2016 07:27 10/02/2016 12:06  Glucose-Capillary Latest Ref Range: 65 - 99 mg/dL 250 (H) 231 (H) 162 (H) 200 (H) 214 (H)    Inpatient Diabetes Program Recommendations:    Increase Lantus to home dose of 61 units QAM.  Thank you. Lorenda Peck, RD, LDN, CDE Inpatient Diabetes Coordinator [phone]

## 2016-10-02 NOTE — Consult Note (Signed)
[facility] CM Primary Care Navigator  10/02/2016  Maurice Perkins Apr 15, 1946 [id number]   Patient seen at the bedside to identify discharge needs. Met with wife Blanch Media) and other family members in the room. Patient shared he had developed hernia from previous surgery that led to this admission/ surgery. Discharge plan is to go home per patient.  Patient confirms that primary provider is Dr. Levin Erp at [facility]. Patient verbalized being able to drive self prior to admission. Transportation to doctors' appointments will be provided by wife as stated.  Patient Sports coach pharmacy at [facility] to obtain medications. He states will try to use [facility] next year to save some amount. Patient does his own medication management at home without difficulty as stated. Wife is the primary caregiver at home.  Patient and wife had expressed understanding to call primary care provider's office for a post discharge follow-up appointment within a week or sooner if needed.  Patient letter provided as a reminder.  Patient had been followed up by Inpatient DM coordinator while in the hospital. Patient monitors his blood sugar on a daily basis, administers his insulin/ medications and had previously attended DM class. He states Dr. Nyoka Cowden manages his DM and denies any needs at this time.  For questions, please contact:  Dannielle Huh, BSN, RN- [facility] Primary Care Navigator  Telephone: [phone] [place]

## 2016-10-03 LAB — CBC
HCT: 39 % (ref 39.0–52.0)
Hemoglobin: 13.1 g/dL (ref 13.0–17.0)
MCH: 29.5 pg (ref 26.0–34.0)
MCHC: 33.6 g/dL (ref 30.0–36.0)
MCV: 87.8 fL (ref 78.0–100.0)
PLATELETS: 204 10*3/uL (ref 150–400)
RBC: 4.44 MIL/uL (ref 4.22–5.81)
RDW: 14.2 % (ref 11.5–15.5)
WBC: 8.1 10*3/uL (ref 4.0–10.5)

## 2016-10-03 LAB — BASIC METABOLIC PANEL
Anion gap: 8 (ref 5–15)
BUN: 22 mg/dL — AB (ref 6–20)
CO2: 27 mmol/L (ref 22–32)
CREATININE: 1.66 mg/dL — AB (ref 0.61–1.24)
Calcium: 8.3 mg/dL — ABNORMAL LOW (ref 8.9–10.3)
Chloride: 100 mmol/L — ABNORMAL LOW (ref 101–111)
GFR, EST AFRICAN AMERICAN: 46 mL/min — AB (ref >60)
GFR, EST NON AFRICAN AMERICAN: 40 mL/min — AB (ref >60)
Glucose, Bld: 192 mg/dL — ABNORMAL HIGH (ref 65–99)
Potassium: 3.9 mmol/L (ref 3.5–5.1)
SODIUM: 135 mmol/L (ref 135–145)

## 2016-10-03 LAB — GLUCOSE, CAPILLARY
GLUCOSE-CAPILLARY: 184 mg/dL — AB (ref 65–99)
GLUCOSE-CAPILLARY: 245 mg/dL — AB (ref 65–99)

## 2016-10-03 MED ORDER — METHOCARBAMOL 750 MG PO TABS: 750.0000 mg | ORAL_TABLET | Freq: Three times a day (TID) | ORAL | 0 refills | Status: DC | PRN

## 2016-10-03 MED ORDER — OXYCODONE HCL 5 MG PO TABS: 5.0000 mg | ORAL_TABLET | ORAL | 0 refills | Status: DC | PRN

## 2016-10-03 NOTE — Progress Notes (Signed)
Pt discharged home in stable condition after going over discharge instructions with no concerns voiced

## 2016-10-03 NOTE — Discharge Summary (Signed)
Physician Discharge Summary  Patient ID: Maurice Perkins MRN: [id number] DOB/AGE: 72-Jan-1947 72 y.o.  Admit date: 09/30/2016 Discharge date: 10/03/2016  Admission Diagnoses:  Discharge Diagnoses:  Active Problems:   Incarcerated ventral hernia   Discharged Condition: good  Hospital Course: uneventful post op recovery  Consults: None  Significant Diagnostic Studies:   Treatments: surgery: ventral hernia repair  Discharge Exam: Blood pressure 122/62, pulse 71, temperature 98.6 F (37 C), temperature source Oral, resp. rate 18, height 6\' 3"  (1.905 m), weight 103.9 kg (229 lb), SpO2 96 %. General appearance: alert, cooperative and no distress Resp: clear to auscultation bilaterally Cardio: regular rate and rhythm, S1, S2 normal, no murmur, click, rub or gallop Incision/Wound:incision clean  Disposition: 01-Home or Self Care     Medication List    TAKE these medications   ammonium lactate 12 % lotion Commonly known as:  LAC-HYDRIN Apply 1 application topically daily. Applied after patient has bathe   aspirin EC 81 MG tablet Take 81 mg by mouth daily.   atenolol 50 MG tablet Commonly known as:  TENORMIN Take 25 mg by mouth every morning.   atorvastatin 40 MG tablet Commonly known as:  LIPITOR Take 40 mg by mouth daily.   fenofibrate 160 MG tablet Take 160 mg by mouth daily.   glimepiride 4 MG tablet Commonly known as:  AMARYL Take 4 mg by mouth 2 (two) times daily.   GLUCOSAMINE CHONDROITIN MSM PO Take 2 tablets by mouth daily.   insulin aspart 100 UNIT/ML injection Commonly known as:  novoLOG Inject 3-12 Units into the skin 2 (two) times daily. 12 units in the morning and 3 units at supper   Insulin Glargine 100 UNIT/ML Solostar Pen Commonly known as:  LANTUS Inject 61 Units into the skin every morning.   losartan-hydrochlorothiazide 50-12.5 MG tablet Commonly known as:  HYZAAR Take 1 tablet by mouth every morning.   metFORMIN 500 MG  tablet Commonly known as:  GLUCOPHAGE Take 1,000-1,500 mg by mouth 2 (two) times daily with a meal. 1000 mg in the morning and 1500 mg in the evening   multivitamin with minerals Tabs tablet Take 1 tablet by mouth daily.   oxyCODONE 5 MG immediate release tablet Commonly known as:  Oxy IR/ROXICODONE Take 1-2 tablets (5-10 mg total) by mouth every 4 (four) hours as needed for moderate pain.   tetrahydrozoline 0.05 % ophthalmic solution Place 1-3 drops into both eyes 3 (three) times daily as needed (for itchy/scratchy eyes).      Follow-up Information    BYERLY,FAERA, MD Follow up in 2 week(s).   Specialty:  General Surgery Contact information: [address] [place] [address] [phone]           Signed: Harl Bowie 10/03/2016, 9:16 AM

## 2016-10-03 NOTE — Progress Notes (Signed)
3 Days Post-Op  Subjective: Comfortable No BM yet A little shaky going to the bathroom  Objective: Vital signs in last 24 hours: Temp:  [98.6 F (37 C)-100.9 F (38.3 C)] 98.6 F (37 C) (10/21 0540) Pulse Rate:  [71-82] 71 (10/21 0540) BP: (116-123)/(56-66) 122/62 (10/21 0540) SpO2:  [87 %-96 %] 96 % (10/21 0540) Last BM Date: 09/29/16  Intake/Output from previous day: 10/20 0701 - 10/21 0700 In: 1183 [P.O.:180; I.V.:1003] Out: 925 [Urine:925] Intake/Output this shift: No intake/output data recorded.  Exam: In bed Awake and alert Lungs clear Abdomen soft, binder in place Incision clean  Lab Results:   Recent Labs  10/02/16 0727 10/03/16 0217  WBC 9.3 8.1  HGB 13.0 13.1  HCT 39.7 39.0  PLT 207 204   BMET  Recent Labs  10/02/16 0727 10/03/16 0217  NA 135 135  K 3.7 3.9  CL 100* 100*  CO2 29 27  GLUCOSE 198* 192*  BUN 20 22*  CREATININE 1.77* 1.66*  CALCIUM 8.3* 8.3*   PT/INR No results for input(s): LABPROT, INR in the last 72 hours. ABG No results for input(s): PHART, HCO3 in the last 72 hours.  Invalid input(s): PCO2, PO2  Studies/Results: No results found.  Anti-infectives: Anti-infectives    Start     Dose/Rate Route Frequency Ordered Stop   09/30/16 2000  ciprofloxacin (CIPRO) IVPB 400 mg     400 mg 200 mL/hr over 60 Minutes Intravenous Every 12 hours 09/30/16 1227 09/30/16 2059   09/30/16 0750  ciprofloxacin (CIPRO) IVPB 400 mg     400 mg 200 mL/hr over 60 Minutes Intravenous On call to O.R. 09/30/16 0750 09/30/16 0858      Assessment/Plan: s/p Procedure(s): LAPAROSCOPIC VENTRAL HERNIA (N/A) INSERTION OF MESH (N/A)  Improving  Discharge home  LOS: 3 days    Shenay Torti A 10/03/2016

## 2016-11-24 DIAGNOSIS — E119 Type 2 diabetes mellitus without complications: Secondary | ICD-10-CM | POA: Diagnosis not present

## 2016-11-24 DIAGNOSIS — H2513 Age-related nuclear cataract, bilateral: Secondary | ICD-10-CM | POA: Diagnosis not present

## 2016-11-30 DIAGNOSIS — I10 Essential (primary) hypertension: Secondary | ICD-10-CM | POA: Diagnosis not present

## 2016-11-30 DIAGNOSIS — E119 Type 2 diabetes mellitus without complications: Secondary | ICD-10-CM | POA: Diagnosis not present

## 2016-11-30 DIAGNOSIS — Z125 Encounter for screening for malignant neoplasm of prostate: Secondary | ICD-10-CM | POA: Diagnosis not present

## 2016-11-30 DIAGNOSIS — E118 Type 2 diabetes mellitus with unspecified complications: Secondary | ICD-10-CM | POA: Diagnosis not present

## 2016-11-30 DIAGNOSIS — E1165 Type 2 diabetes mellitus with hyperglycemia: Secondary | ICD-10-CM | POA: Diagnosis not present

## 2016-11-30 DIAGNOSIS — Z0000 Encounter for general adult medical examination without abnormal findings: Secondary | ICD-10-CM | POA: Diagnosis not present

## 2016-11-30 DIAGNOSIS — I2510 Atherosclerotic heart disease of native coronary artery without angina pectoris: Secondary | ICD-10-CM | POA: Diagnosis not present

## 2017-03-25 DIAGNOSIS — E1165 Type 2 diabetes mellitus with hyperglycemia: Secondary | ICD-10-CM | POA: Diagnosis not present

## 2017-03-25 DIAGNOSIS — E118 Type 2 diabetes mellitus with unspecified complications: Secondary | ICD-10-CM | POA: Diagnosis not present

## 2017-03-25 DIAGNOSIS — I2510 Atherosclerotic heart disease of native coronary artery without angina pectoris: Secondary | ICD-10-CM | POA: Diagnosis not present

## 2017-03-25 DIAGNOSIS — I10 Essential (primary) hypertension: Secondary | ICD-10-CM | POA: Diagnosis not present

## 2017-04-15 DIAGNOSIS — J0110 Acute frontal sinusitis, unspecified: Secondary | ICD-10-CM | POA: Diagnosis not present

## 2017-04-15 DIAGNOSIS — H1012 Acute atopic conjunctivitis, left eye: Secondary | ICD-10-CM | POA: Diagnosis not present

## 2017-10-21 ENCOUNTER — Encounter: Payer: Self-pay | Admitting: Gastroenterology

## 2017-11-18 ENCOUNTER — Encounter (HOSPITAL_COMMUNITY): Payer: Self-pay | Admitting: Emergency Medicine

## 2017-11-18 ENCOUNTER — Other Ambulatory Visit: Payer: Self-pay

## 2017-11-18 ENCOUNTER — Inpatient Hospital Stay (HOSPITAL_COMMUNITY): Payer: Medicare Other

## 2017-11-18 ENCOUNTER — Inpatient Hospital Stay (HOSPITAL_COMMUNITY)
Admission: EM | Admit: 2017-11-18 | Discharge: 2017-11-20 | DRG: 378 | Disposition: A | Discharge status: Home / Self Care | Payer: Medicare Other | Attending: Internal Medicine | Admitting: Internal Medicine

## 2017-11-18 DIAGNOSIS — D123 Benign neoplasm of transverse colon: Secondary | ICD-10-CM | POA: Diagnosis present

## 2017-11-18 DIAGNOSIS — K5730 Diverticulosis of large intestine without perforation or abscess without bleeding: Secondary | ICD-10-CM

## 2017-11-18 DIAGNOSIS — D631 Anemia in chronic kidney disease: Secondary | ICD-10-CM | POA: Diagnosis not present

## 2017-11-18 DIAGNOSIS — D1803 Hemangioma of intra-abdominal structures: Secondary | ICD-10-CM | POA: Diagnosis present

## 2017-11-18 DIAGNOSIS — K625 Hemorrhage of anus and rectum: Secondary | ICD-10-CM

## 2017-11-18 DIAGNOSIS — Z86010 Personal history of colonic polyps: Secondary | ICD-10-CM

## 2017-11-18 DIAGNOSIS — E1122 Type 2 diabetes mellitus with diabetic chronic kidney disease: Secondary | ICD-10-CM | POA: Diagnosis present

## 2017-11-18 DIAGNOSIS — K760 Fatty (change of) liver, not elsewhere classified: Secondary | ICD-10-CM | POA: Diagnosis not present

## 2017-11-18 DIAGNOSIS — D62 Acute posthemorrhagic anemia: Secondary | ICD-10-CM | POA: Diagnosis not present

## 2017-11-18 DIAGNOSIS — N400 Enlarged prostate without lower urinary tract symptoms: Secondary | ICD-10-CM | POA: Diagnosis present

## 2017-11-18 DIAGNOSIS — Z885 Allergy status to narcotic agent status: Secondary | ICD-10-CM

## 2017-11-18 DIAGNOSIS — Z794 Long term (current) use of insulin: Secondary | ICD-10-CM

## 2017-11-18 DIAGNOSIS — E1165 Type 2 diabetes mellitus with hyperglycemia: Secondary | ICD-10-CM | POA: Diagnosis present

## 2017-11-18 DIAGNOSIS — D128 Benign neoplasm of rectum: Secondary | ICD-10-CM | POA: Diagnosis not present

## 2017-11-18 DIAGNOSIS — D120 Benign neoplasm of cecum: Secondary | ICD-10-CM

## 2017-11-18 DIAGNOSIS — R739 Hyperglycemia, unspecified: Secondary | ICD-10-CM | POA: Diagnosis not present

## 2017-11-18 DIAGNOSIS — D509 Iron deficiency anemia, unspecified: Secondary | ICD-10-CM | POA: Diagnosis present

## 2017-11-18 DIAGNOSIS — K5731 Diverticulosis of large intestine without perforation or abscess with bleeding: Secondary | ICD-10-CM | POA: Diagnosis not present

## 2017-11-18 DIAGNOSIS — D124 Benign neoplasm of descending colon: Secondary | ICD-10-CM | POA: Diagnosis not present

## 2017-11-18 DIAGNOSIS — D649 Anemia, unspecified: Secondary | ICD-10-CM

## 2017-11-18 DIAGNOSIS — N183 Chronic kidney disease, stage 3 (moderate): Secondary | ICD-10-CM | POA: Diagnosis not present

## 2017-11-18 DIAGNOSIS — E785 Hyperlipidemia, unspecified: Secondary | ICD-10-CM | POA: Diagnosis not present

## 2017-11-18 DIAGNOSIS — Z87891 Personal history of nicotine dependence: Secondary | ICD-10-CM

## 2017-11-18 DIAGNOSIS — N179 Acute kidney failure, unspecified: Secondary | ICD-10-CM

## 2017-11-18 DIAGNOSIS — D122 Benign neoplasm of ascending colon: Secondary | ICD-10-CM | POA: Diagnosis not present

## 2017-11-18 DIAGNOSIS — K922 Gastrointestinal hemorrhage, unspecified: Secondary | ICD-10-CM

## 2017-11-18 DIAGNOSIS — Z79899 Other long term (current) drug therapy: Secondary | ICD-10-CM

## 2017-11-18 DIAGNOSIS — I129 Hypertensive chronic kidney disease with stage 1 through stage 4 chronic kidney disease, or unspecified chronic kidney disease: Secondary | ICD-10-CM | POA: Diagnosis not present

## 2017-11-18 DIAGNOSIS — Z880 Allergy status to penicillin: Secondary | ICD-10-CM

## 2017-11-18 DIAGNOSIS — Z8249 Family history of ischemic heart disease and other diseases of the circulatory system: Secondary | ICD-10-CM

## 2017-11-18 DIAGNOSIS — Z7982 Long term (current) use of aspirin: Secondary | ICD-10-CM

## 2017-11-18 LAB — CBC
HEMATOCRIT: 38 % — AB (ref 39.0–52.0)
Hemoglobin: 12.3 g/dL — ABNORMAL LOW (ref 13.0–17.0)
MCH: 29 pg (ref 26.0–34.0)
MCHC: 32.4 g/dL (ref 30.0–36.0)
MCV: 89.6 fL (ref 78.0–100.0)
Platelets: 408 10*3/uL — ABNORMAL HIGH (ref 150–400)
RBC: 4.24 MIL/uL (ref 4.22–5.81)
RDW: 14.4 % (ref 11.5–15.5)
WBC: 8.9 10*3/uL (ref 4.0–10.5)

## 2017-11-18 LAB — GLUCOSE, CAPILLARY
Glucose-Capillary: 150 mg/dL — ABNORMAL HIGH (ref 65–99)
Glucose-Capillary: 173 mg/dL — ABNORMAL HIGH (ref 65–99)

## 2017-11-18 LAB — PROTIME-INR
INR: 1
PROTHROMBIN TIME: 13.1 s (ref 11.4–15.2)

## 2017-11-18 LAB — COMPREHENSIVE METABOLIC PANEL
ALBUMIN: 4 g/dL (ref 3.5–5.0)
ALT: 26 U/L (ref 17–63)
AST: 32 U/L (ref 15–41)
Alkaline Phosphatase: 43 U/L (ref 38–126)
Anion gap: 13 (ref 5–15)
BUN: 38 mg/dL — ABNORMAL HIGH (ref 6–20)
CHLORIDE: 99 mmol/L — AB (ref 101–111)
CO2: 28 mmol/L (ref 22–32)
Calcium: 9.5 mg/dL (ref 8.9–10.3)
Creatinine, Ser: 2.16 mg/dL — ABNORMAL HIGH (ref 0.61–1.24)
GFR, EST AFRICAN AMERICAN: 33 mL/min — AB (ref >60)
GFR, EST NON AFRICAN AMERICAN: 29 mL/min — AB (ref >60)
Glucose, Bld: 282 mg/dL — ABNORMAL HIGH (ref 65–99)
POTASSIUM: 4.7 mmol/L (ref 3.5–5.1)
Sodium: 140 mmol/L (ref 135–145)
Total Bilirubin: 0.3 mg/dL (ref 0.3–1.2)
Total Protein: 6.6 g/dL (ref 6.5–8.1)

## 2017-11-18 LAB — POC OCCULT BLOOD, ED: FECAL OCCULT BLD: POSITIVE — AB

## 2017-11-18 LAB — TYPE AND SCREEN
ABO/RH(D): A POS
Antibody Screen: NEGATIVE

## 2017-11-18 MED ORDER — ACETAMINOPHEN 325 MG PO TABS: 650.0000 mg | ORAL_TABLET | Freq: Four times a day (QID) | ORAL | Status: DC | PRN

## 2017-11-18 MED ORDER — ACETAMINOPHEN 650 MG RE SUPP: 650.0000 mg | Freq: Four times a day (QID) | RECTAL | Status: DC | PRN

## 2017-11-18 MED ORDER — ATORVASTATIN CALCIUM 40 MG PO TABS
40.0000 mg | ORAL_TABLET | Freq: Every day | ORAL | Status: DC
  Administered 2017-11-19: 40 mg via ORAL
  Filled 2017-11-18: qty 1

## 2017-11-18 MED ORDER — INSULIN ASPART 100 UNIT/ML ~~LOC~~ SOLN
0.0000 [IU] | SUBCUTANEOUS | Status: DC
  Administered 2017-11-18: 2 [IU] via SUBCUTANEOUS
  Administered 2017-11-19 (×4): 1 [IU] via SUBCUTANEOUS
  Administered 2017-11-19: 2 [IU] via SUBCUTANEOUS
  Administered 2017-11-20 (×2): 1 [IU] via SUBCUTANEOUS

## 2017-11-18 MED ORDER — METHOCARBAMOL 750 MG PO TABS: 750.0000 mg | ORAL_TABLET | Freq: Three times a day (TID) | ORAL | Status: DC | PRN

## 2017-11-18 MED ORDER — SODIUM CHLORIDE 0.9 % IV SOLN
INTRAVENOUS | Status: DC
  Administered 2017-11-18 – 2017-11-19 (×2): via INTRAVENOUS

## 2017-11-18 MED ORDER — GLIMEPIRIDE 4 MG PO TABS
4.0000 mg | ORAL_TABLET | Freq: Two times a day (BID) | ORAL | Status: DC
  Administered 2017-11-19: 4 mg via ORAL
  Filled 2017-11-18 (×3): qty 1

## 2017-11-18 MED ORDER — OXYCODONE HCL 5 MG PO TABS: 5.0000 mg | ORAL_TABLET | ORAL | Status: DC | PRN

## 2017-11-18 MED ORDER — ATENOLOL 50 MG PO TABS
25.0000 mg | ORAL_TABLET | Freq: Every morning | ORAL | Status: DC
  Administered 2017-11-19 – 2017-11-20 (×2): 25 mg via ORAL
  Filled 2017-11-18 (×2): qty 1

## 2017-11-18 NOTE — ED Notes (Signed)
RN Chelsea notified of positive blood occult result.

## 2017-11-18 NOTE — ED Triage Notes (Signed)
Pt presents from PCP for assessment of bleeding diverticulitis.  Patient denies abdominal pain, but c/o rectal bleeding for 3 days.

## 2017-11-18 NOTE — ED Notes (Signed)
Attempted report to 5W. 

## 2017-11-18 NOTE — H&P (Signed)
TRH H&P   Patient Demographics:    Maurice Perkins, is a 71 y.o. male  MRN: [id number]   DOB - May 30, 1946  Admit Date - 11/18/2017  Outpatient Primary MD for the patient is Levin Erp, MD  Referring MD/NP/PA: Gareth Morgan  Outpatient Specialists:     Patient coming from: home  Chief Complaint  Patient presents with  . Rectal Bleeding      HPI:    Maurice Perkins  is a 71 y.o. male, w hypertension, hyperlipidemia, dm2, pancreatic mass (mri 08/19/2016),  apparently c/o rectal bleeding. Pt states rectal bleeding began on Tuesday.  Blood on toilet paper and in bowel.  Pt also had another episode on Wednesday and then today and was told by his pcp to go to ER for evaluation.  + diarrhea over the weekend. Some nausea. Denies fever, chills, emesis, constipation, black stool.   In Ed,   Na 140, K 4.7, Glucose 282 Bun 38, Creatinine 2.16 Ast 32, Alt 26 Wbc 8.9, Hgb 12.3, Plt 408  FOBT +  ED contacted GI and they recommended if further bleeding red cell tagged scan  GI will consult in am Pt will be admitted for rectal bleeding.    Review of systems:    In addition to the HPI above,  No Fever-chills, No Headache, No changes with Vision or hearing, No problems swallowing food or Liquids, No Chest pain, Cough or Shortness of Breath, No Abdominal pain, No Nausea or Vommitting, Bowel movements are regular, No Blood in stool or Urine, No dysuria, No new skin rashes or bruises, No new joints pains-aches,  No new weakness, tingling, numbness in any extremity, No recent weight gain or loss, No polyuria, polydypsia or polyphagia, No significant Mental Stressors.  A full 10 point Review of Systems was done, except as stated above, all other Review of Systems were negative.   With Past History of the following :    Past Medical History:  Diagnosis Date  . Diabetes ([place])    . Dry skin    pt has skin oil deficiency ischtiosis  . Hyperlipidemia   . Hypertension   . Other general symptoms(780.99)   . Pancreatic mass   . Unspecified disease of pancreas       Past Surgical History:  Procedure Laterality Date  . ABDOMINAL EXPLORATION SURGERY  09/25/2015  . COLONOSCOPY     2004 dr Earlean Shawl  . EUS N/A 05/17/2014   Procedure: UPPER ENDOSCOPIC ULTRASOUND (EUS) LINEAR;  Surgeon: Milus Banister, MD;  [facility];  Service: Endoscopy;  Laterality: N/A;  . INSERTION OF MESH N/A 09/30/2016   Procedure: INSERTION OF MESH;  Surgeon: Stark Klein, MD;  Location: [facility];  Service: General;  Laterality: N/A;  . LAPAROTOMY N/A 09/25/2015   Procedure: EXPLORATORY LAPAROTOMY small bowel resection;  Surgeon: Stark Klein, MD;  Location: [facility];  Service: General;  Laterality: N/A;  . NASAL SINUS SURGERY  1970's  . pilonodal cyst     in high school-bottom of spine  . SHOULDER SURGERY Right 2000  . VENTRAL HERNIA REPAIR  09/30/2016   LAPROSCOPIC  . VENTRAL HERNIA REPAIR N/A 09/30/2016   Procedure: LAPAROSCOPIC VENTRAL HERNIA;  Surgeon: Stark Klein, MD;  Location: [facility];  Service: General;  Laterality: N/A;      Social History:     Social History   Tobacco Use  . Smoking status: Former Smoker    Packs/day: 2.00    Years: 15.00    Pack years: 30.00    Types: Cigarettes    Last attempt to quit: 12/15/1971    Years since quitting: 45.9  . Smokeless tobacco: Never Used  Substance Use Topics  . Alcohol use: No    Alcohol/week: 0.0 oz     Lives - at home  Mobility - walks by self   Family History :     Family History  Problem Relation Age of Onset  . Heart disease Mother   . Breast cancer Mother        breast  . Colon cancer Neg Hx   . Stomach cancer Neg Hx   . Ulcerative colitis Neg Hx      Home Medications:   Prior to Admission medications   Medication Sig Start Date End Date Taking? Authorizing Provider  ammonium lactate (LAC-HYDRIN) 12  % lotion Apply 1 application topically daily. Applied after patient has bathe 06/11/16   [provider]  aspirin EC 81 MG tablet Take 81 mg by mouth daily.    [provider]  atenolol (TENORMIN) 50 MG tablet Take 25 mg by mouth every morning.     [provider]  atorvastatin (LIPITOR) 40 MG tablet Take 40 mg by mouth daily.    [provider]  fenofibrate 160 MG tablet Take 160 mg by mouth daily.    [provider]  glimepiride (AMARYL) 4 MG tablet Take 4 mg by mouth 2 (two) times daily.    [provider]  insulin aspart (NOVOLOG) 100 UNIT/ML injection Inject 3-12 Units into the skin 2 (two) times daily. 12 units in the morning and 3 units at supper    [provider]  Insulin Glargine (LANTUS) 100 UNIT/ML Solostar Pen Inject 61 Units into the skin every morning.     [provider]  losartan-hydrochlorothiazide (HYZAAR) 50-12.5 MG per tablet Take 1 tablet by mouth every morning.     [provider]  metFORMIN (GLUCOPHAGE) 500 MG tablet Take 1,000-1,500 mg by mouth 2 (two) times daily with a meal. 1000 mg in the morning and 1500 mg in the evening    [provider]  methocarbamol (ROBAXIN) 750 MG tablet Take 1 tablet (750 mg total) by mouth every 8 (eight) hours as needed (use for muscle cramps/pain). 10/03/16   Rolm Bookbinder, MD  Misc Natural Products (GLUCOSAMINE CHONDROITIN MSM PO) Take 2 tablets by mouth daily.    [provider]  Multiple Vitamin (MULTIVITAMIN WITH MINERALS) TABS tablet Take 1 tablet by mouth daily.    [provider]  oxyCODONE (OXY IR/ROXICODONE) 5 MG immediate release tablet Take 1-2 tablets (5-10 mg total) by mouth every 4 (four) hours as needed for moderate pain. 10/03/16   Coralie Keens, MD  tetrahydrozoline 0.05 % ophthalmic solution Place 1-3 drops into both eyes 3 (three) times daily as needed (for itchy/scratchy eyes).    [provider]  Allergies:     Allergies  Allergen Reactions  . Penicillins Hives    Has patient had a PCN reaction causing immediate rash, facial/tongue/throat swelling, SOB or lightheadedness with hypotension:Yes Has patient had a PCN reaction causing severe rash involving mucus membranes or skin necrosis:unsure Has patient had a PCN reaction that required hospitalization:no Has patient had a PCN reaction occurring within the last 10 years:no If all of the above answers are "NO", then may proceed with Cephalosporin use.   Marland Kitchen Hydrocodone Nausea Only     Physical Exam:   Vitals  Blood pressure 107/67, pulse 77, temperature 97.9 F (36.6 C), temperature source Oral, resp. rate 16, SpO2 100 %.   1. General  lying in bed in NAD,    2. Normal affect and insight, Not Suicidal or Homicidal, Awake Alert, Oriented X 3.  3. No F.N deficits, ALL C.Nerves Intact, Strength 5/5 all 4 extremities, Sensation intact all 4 extremities, Plantars down going.  4. Ears and Eyes appear Normal, Conjunctivae clear, PERRLA. Moist Oral Mucosa.  5. Supple Neck, No JVD, No cervical lymphadenopathy appriciated, No Carotid Bruits.  6. Symmetrical Chest wall movement, Good air movement bilaterally, CTAB.  7. RRR, No Gallops, Rubs or Murmurs, No Parasternal Heave.  8. Positive Bowel Sounds, Abdomen Soft, No tenderness, No organomegaly appriciated,No rebound -guarding or rigidity.  9.  No Cyanosis, Normal Skin Turgor, No Skin Rash or Bruise.  10. Good muscle tone,  joints appear normal , no effusions, Normal ROM.  11. No Palpable Lymph Nodes in Neck or Axillae     Data Review:    CBC Recent Labs  Lab 11/18/17 1635  WBC 8.9  HGB 12.3*  HCT 38.0*  PLT 408*  MCV 89.6  MCH 29.0  MCHC 32.4  RDW 14.4   ------------------------------------------------------------------------------------------------------------------  Chemistries  Recent Labs  Lab 11/18/17 1635  NA 140  K 4.7  CL 99*  CO2 28  GLUCOSE  282*  BUN 38*  CREATININE 2.16*  CALCIUM 9.5  AST 32  ALT 26  ALKPHOS 43  BILITOT 0.3   ------------------------------------------------------------------------------------------------------------------ CrCl cannot be calculated (Unknown ideal weight.). ------------------------------------------------------------------------------------------------------------------ No results for input(s): TSH, T4TOTAL, T3FREE, THYROIDAB in the last 72 hours.  Invalid input(s): FREET3  Coagulation profile Recent Labs  Lab 11/18/17 1635  INR 1.00   ------------------------------------------------------------------------------------------------------------------- No results for input(s): DDIMER in the last 72 hours. -------------------------------------------------------------------------------------------------------------------  Cardiac Enzymes No results for input(s): CKMB, TROPONINI, MYOGLOBIN in the last 168 hours.  Invalid input(s): CK ------------------------------------------------------------------------------------------------------------------ No results found for: BNP   ---------------------------------------------------------------------------------------------------------------  Urinalysis    Component Value Date/Time   COLORURINE YELLOW 09/22/2016 [place] 09/22/2016 1052   LABSPEC 1.022 09/22/2016 1052   PHURINE 5.5 09/22/2016 [place] 09/22/2016 [place] 09/22/2016 [place] 09/22/2016 [place] 09/22/2016 1052   PROTEINUR NEGATIVE 09/22/2016 1052   UROBILINOGEN 0.2 09/25/2015 2328   NITRITE NEGATIVE 09/22/2016 [place] 09/22/2016 1052    ----------------------------------------------------------------------------------------------------------------   Imaging Results:    No results found.    Assessment & Plan:    Principal Problem:   Rectal bleeding Active  Problems:   Anemia   Hyperglycemia   ARF (acute renal failure) (HCC)    Rectal bleeding NPO Gi consult If further bleeding please obtain nuclear red cell tagged scan  Anemia Type and screen Check ferritin, iron, tibc, b12, folate Cbc q6h x3  ARF Check urine sodium, urine  creatinine, urine eosinophils Check renal ultrasound Hydrate with ns iv Check cmp in am STOP LOSARTAN/HYDROCHLOROTHIAZIDE STOP METFORMIN  Hyperglycemia/Dm2 fsbs q4h, ISS STOP AMARYL  Hypertension Cont atenolol  Hyperlipidemia Cont Lipitor STOP FENOFIBRATE (renal dosing required)   DVT Prophylaxis  SCDs  AM Labs Ordered, also please review Full Orders  Family Communication: Admission, patients condition and plan of care including tests being ordered have been discussed with the patient who indicate understanding and agree with the plan and Code Status.  Code Status FULL CODE  Likely DC to  home  Condition GUARDED    Consults called: GI by ED,   Admission status: inpatient  Time spent in minutes : 45   Jani Gravel M.D on 11/18/2017 at 7:15 PM  Between 7pm to 7am - Pager - [phone]  . After 7am go to [email or web address] - password [facility]  [facility] - Office  [phone]

## 2017-11-18 NOTE — ED Notes (Signed)
ED Provider at bedside. 

## 2017-11-18 NOTE — ED Notes (Signed)
Dr. Billy Fischer saw patient in triage.  States patient okay to wait in lobby for room.  Will continue to monitor labs.

## 2017-11-18 NOTE — ED Provider Notes (Signed)
[facility] Provider Note   CSN: [id number] Arrival date & time: 11/18/17  1543     History   Chief Complaint Chief Complaint  Patient Maurice Perkins  . Rectal Bleeding    HPI ALEXIO SROKA is a 71 y.o. male.  HPI   71 year old male Perkins a history of Perkins, Maurice Perkins, Maurice Perkins, Maurice Perkins concern for rectal bleeding.  Patient reports that over the last 3 days, he has had bloody bowel movements.  Reports that 3 days ago, had a small bowel movement Perkins small amount of blood, however yesterday reports had an episode where he passed a large amount of blood clots, without stool.  Today, and reports that he had stool mixed in Perkins blood clots.  Reports he saw his primary care physician who is concern for diverticular bleed and sent him to the emergency department.  He denies abdominal pain, nausea, vomiting or lightheadedness.  Denies chest pain or shortness of breath.  Past Medical History:  Diagnosis Date  . Diabetes ([place])   . Dry skin    pt has skin oil deficiency ischtiosis  . Maurice Perkins   . Perkins   . Other general symptoms(780.99)   . Pancreatic mass   . Unspecified disease of pancreas     Patient Active Problem List   Diagnosis Date Noted  . Rectal bleeding 11/18/2017  . Anemia 11/18/2017  . Hyperglycemia 11/18/2017  . ARF (acute renal failure) ([place]) 11/18/2017  . Incarcerated ventral hernia 09/30/2016  . Diverticulitis of jejunum Perkins perforation s/p SB resection 09/25/2015 09/28/2015  . Perforated viscus 09/26/2015  . Pancreatic mass 05/09/2014  . Special screening for malignant neoplasms, colon 05/09/2014  . Maurice Perkins-MIXED 10/22/2010  . OVERWEIGHT/OBESITY 10/22/2010  . Perkins, UNSPECIFIED 10/22/2010  . CAD, NATIVE VESSEL 10/22/2010    Past Surgical History:  Procedure Laterality Date  . ABDOMINAL EXPLORATION SURGERY  09/25/2015  . COLONOSCOPY     2004 dr Earlean Shawl  . EUS N/A  05/17/2014   Procedure: UPPER ENDOSCOPIC ULTRASOUND (EUS) LINEAR;  Surgeon: Milus Banister, MD;  [facility];  Service: Endoscopy;  Laterality: N/A;  . INSERTION OF MESH N/A 09/30/2016   Procedure: INSERTION OF MESH;  Surgeon: Stark Klein, MD;  Location: [facility];  Service: General;  Laterality: N/A;  . LAPAROTOMY N/A 09/25/2015   Procedure: EXPLORATORY LAPAROTOMY small bowel resection;  Surgeon: Stark Klein, MD;  Location: [facility];  Service: General;  Laterality: N/A;  . NASAL SINUS SURGERY  1970's  . pilonodal cyst     in high school-bottom of spine  . SHOULDER SURGERY Right 2000  . VENTRAL HERNIA REPAIR  09/30/2016   LAPROSCOPIC  . VENTRAL HERNIA REPAIR N/A 09/30/2016   Procedure: LAPAROSCOPIC VENTRAL HERNIA;  Surgeon: Stark Klein, MD;  Location: [facility];  Service: General;  Laterality: N/A;       Home Medications    Prior to Admission medications   Medication Sig Start Date End Date Taking? Authorizing Provider  ammonium lactate (LAC-HYDRIN) 12 % lotion Apply 1 application topically daily. Applied after patient has bathe 06/11/16  Yes [provider]  aspirin EC 81 MG tablet Take 81 mg by mouth daily.   Yes [provider]  atenolol (TENORMIN) 50 MG tablet Take 25 mg by mouth every morning.    Yes [provider]  atorvastatin (LIPITOR) 40 MG tablet Take 40 mg by mouth daily.   Yes [provider]  fenofibrate 160 MG tablet Take 160 mg by  mouth daily.   Yes [provider]  glimepiride (AMARYL) 4 MG tablet Take 4 mg by mouth 2 (two) times daily.   Yes [provider]  insulin aspart (NOVOLOG) 100 UNIT/ML injection Inject 12 Units into the skin daily.    Yes [provider]  Insulin Glargine (LANTUS) 100 UNIT/ML Solostar Pen Inject 60 Units into the skin every morning.    Yes [provider]  losartan-hydrochlorothiazide (HYZAAR) 50-12.5 MG per tablet Take 1 tablet by mouth every morning.    Yes [provider]  metFORMIN (GLUCOPHAGE) 500 MG tablet Take 500-1,000 mg by mouth See admin instructions. 1000 mg at noon and 500 mg in the evening   Yes [provider]  Misc Natural Products (GLUCOSAMINE CHONDROITIN MSM PO) Take 2 tablets by mouth daily.   Yes [provider]  Multiple Vitamin (MULTIVITAMIN Perkins MINERALS) TABS tablet Take 1 tablet by mouth daily.   Yes [provider]  Probiotic Product (PROBIOTIC PO) Take 1 capsule by mouth daily.   Yes [provider]  tetrahydrozoline 0.05 % ophthalmic solution Place 1-3 drops into both eyes 3 (three) times daily as needed (for itchy/scratchy eyes).   Yes [provider]  methocarbamol (ROBAXIN) 750 MG tablet Take 1 tablet (750 mg total) by mouth every 8 (eight) hours as needed (use for muscle cramps/pain). Patient not taking: Reported on 11/18/2017 10/03/16   Rolm Bookbinder, MD  oxyCODONE (OXY IR/ROXICODONE) 5 MG immediate release tablet Take 1-2 tablets (5-10 mg total) by mouth every 4 (four) hours as needed for moderate pain. Patient not taking: Reported on 11/18/2017 10/03/16   Coralie Keens, MD    Family History Family History  Problem Relation Age of Onset  . Heart disease Mother   . Breast cancer Mother        breast  . Colon cancer Neg Hx   . Stomach cancer Neg Hx   . Ulcerative colitis Neg Hx     Social History Social History   Tobacco Use  . Smoking status: Former Smoker    Packs/day: 2.00    Years: 15.00    Pack years: 30.00    Types: Cigarettes    Last attempt to quit: 12/15/1971    Years since quitting: 45.9  . Smokeless tobacco: Never Used  Substance Use Topics  . Alcohol use: No    Alcohol/week: 0.0 oz  . Drug use: No     Allergies   Penicillins and Hydrocodone   Review of Systems Review of Systems  Constitutional: Negative for fever.  HENT: Negative for sore throat.   Eyes: Negative for visual disturbance.  Respiratory: Negative for shortness of  breath.   Cardiovascular: Negative for chest pain.  Gastrointestinal: Positive for anal bleeding and blood in stool. Negative for abdominal pain, nausea and vomiting.  Genitourinary: Negative for difficulty urinating.  Musculoskeletal: Negative for back pain and neck stiffness.  Skin: Negative for rash.  Neurological: Negative for syncope, light-headedness and headaches.     Physical Exam Updated Vital Signs BP 122/63 (BP Location: Right Arm)   Pulse 66   Temp 98.3 F (36.8 C) (Oral)   Resp 18   Ht 6\' 3"  (1.905 m)   Wt 94.9 kg (209 lb 4.8 oz)   SpO2 97%   BMI 26.16 kg/m   Physical Exam  Constitutional: He is oriented to person, place, and time. He appears well-developed and well-nourished. No distress.  HENT:  Head: Normocephalic and atraumatic.  Eyes: Conjunctivae and EOM  are normal.  Neck: Normal range of motion.  Cardiovascular: Normal rate, regular rhythm, normal heart sounds and intact distal pulses. Exam reveals no gallop and no friction rub.  No murmur heard. Pulmonary/Chest: Effort normal and breath sounds normal. No respiratory distress. He has no wheezes. He has no rales.  Abdominal: Soft. He exhibits no distension. There is no tenderness. There is no guarding.  Genitourinary:  Genitourinary Comments: Maroon colored stool  Musculoskeletal: He exhibits no edema.  Neurological: He is alert and oriented to person, place, and time.  Skin: Skin is warm and dry. He is not diaphoretic.  Nursing note and vitals reviewed.    ED Treatments / Results  Labs (all labs ordered are listed, but only abnormal results are displayed) Labs Reviewed  COMPREHENSIVE METABOLIC PANEL - Abnormal; Notable for the following components:      Result Value   Chloride 99 (*)    Glucose, Bld 282 (*)    BUN 38 (*)    Creatinine, Ser 2.16 (*)    GFR calc non Af Amer 29 (*)    GFR calc Af Amer 33 (*)    All other components within normal limits  CBC - Abnormal; Notable for the following  components:   Hemoglobin 12.3 (*)    HCT 38.0 (*)    Platelets 408 (*)    All other components within normal limits  GLUCOSE, CAPILLARY - Abnormal; Notable for the following components:   Glucose-Capillary 173 (*)    All other components within normal limits  GLUCOSE, CAPILLARY - Abnormal; Notable for the following components:   Glucose-Capillary 150 (*)    All other components within normal limits  POC OCCULT BLOOD, ED - Abnormal; Notable for the following components:   Fecal Occult Bld POSITIVE (*)    All other components within normal limits  PROTIME-INR  CBC  COMPREHENSIVE METABOLIC PANEL  FERRITIN  IRON AND TIBC  VITAMIN B12  FOLATE RBC  SODIUM, URINE, RANDOM  CALCIUM / CREATININE RATIO, URINE  CBC  TYPE AND SCREEN  CYTOLOGY - NON PAP    EKG  EKG Interpretation None       Radiology US Renal  Result Date: 11/18/2017 CLINICAL DATA:  71 year old male Perkins acute renal failure. EXAM: RENAL / URINARY TRACT ULTRASOUND COMPLETE COMPARISON:  Abdominal CT dated 03/16/2016 and MRI dated 08/19/2016 FINDINGS: Right Kidney: Length: 11.1 cm. Normal echogenicity. Mild parenchymal atrophy. There is no hydronephrosis or echogenic stone. A 3 cm midpole cyst noted. Left Kidney: Length: 11.2 cm. Normal echogenicity. Mild parenchymal atrophy. No hydronephrosis or echogenic stone. A 1.5 cm inferior pole cyst. Bladder: Appears normal for degree of bladder distention. The prostate gland measures 4.4 cm in greatest dimension Perkins a total volume of 32 cc. There is diffuse fatty infiltration of the liver. IMPRESSION: 1. No hydronephrosis or echogenic stone. 2. Fatty liver. Electronically Signed   By: Anner Crete M.D.   On: 11/18/2017 21:41    Procedures Procedures (including critical care time)  Medications Ordered in ED Medications  atenolol (TENORMIN) tablet 25 mg (not administered)  atorvastatin (LIPITOR) tablet 40 mg (not administered)  glimepiride (AMARYL) tablet 4 mg (not  administered)  methocarbamol (ROBAXIN) tablet 750 mg (not administered)  oxyCODONE (Oxy IR/ROXICODONE) immediate release tablet 5-10 mg (not administered)  0.9 %  sodium chloride infusion ( Intravenous New Bag/Given 11/18/17 2235)  acetaminophen (TYLENOL) tablet 650 mg (not administered)    Or  acetaminophen (TYLENOL) suppository 650 mg (not administered)  insulin aspart (novoLOG) injection  0-9 Units (1 Units Subcutaneous Given 11/19/17 0010)     Initial Impression / Assessment and Plan / ED Course  I have reviewed the triage vital signs and the nursing notes.  Pertinent labs & imaging results that were available during my care of the patient were reviewed by me and considered in my medical decision making (see chart for details).     71 year old male Perkins a history of Perkins, Maurice Perkins, Maurice Perkins, Maurice Perkins concern for rectal bleeding.  He is hemodynamically stable in the emergency department, hemoglobin is 12.3.  Discussed [facility], Dr. Havery Moros.  Concern for possible stuttering diverticular bleed, particularly given history patient described yesterday.  Recommends admission for observation, and to obtain a tagged RBC scan if he begins bleeding again overnight.  Discussed Perkins patient.  Will admit for further care.   Final Clinical Impressions(s) / ED Diagnoses   Final diagnoses:  Rectal bleeding  Acute GI bleeding    ED Discharge Orders    None       Gareth Morgan, MD 11/19/17 [phone]

## 2017-11-19 ENCOUNTER — Encounter (HOSPITAL_COMMUNITY): Payer: Self-pay | Admitting: Physician Assistant

## 2017-11-19 DIAGNOSIS — K5731 Diverticulosis of large intestine without perforation or abscess with bleeding: Secondary | ICD-10-CM | POA: Diagnosis not present

## 2017-11-19 DIAGNOSIS — R739 Hyperglycemia, unspecified: Secondary | ICD-10-CM

## 2017-11-19 DIAGNOSIS — D649 Anemia, unspecified: Secondary | ICD-10-CM

## 2017-11-19 DIAGNOSIS — K625 Hemorrhage of anus and rectum: Secondary | ICD-10-CM

## 2017-11-19 DIAGNOSIS — D128 Benign neoplasm of rectum: Secondary | ICD-10-CM | POA: Diagnosis not present

## 2017-11-19 DIAGNOSIS — E1122 Type 2 diabetes mellitus with diabetic chronic kidney disease: Secondary | ICD-10-CM | POA: Diagnosis not present

## 2017-11-19 DIAGNOSIS — Z794 Long term (current) use of insulin: Secondary | ICD-10-CM | POA: Diagnosis not present

## 2017-11-19 DIAGNOSIS — K5730 Diverticulosis of large intestine without perforation or abscess without bleeding: Secondary | ICD-10-CM | POA: Diagnosis not present

## 2017-11-19 DIAGNOSIS — N179 Acute kidney failure, unspecified: Secondary | ICD-10-CM | POA: Diagnosis not present

## 2017-11-19 DIAGNOSIS — D120 Benign neoplasm of cecum: Secondary | ICD-10-CM | POA: Diagnosis not present

## 2017-11-19 DIAGNOSIS — K635 Polyp of colon: Secondary | ICD-10-CM | POA: Diagnosis not present

## 2017-11-19 DIAGNOSIS — D123 Benign neoplasm of transverse colon: Secondary | ICD-10-CM | POA: Diagnosis not present

## 2017-11-19 DIAGNOSIS — K922 Gastrointestinal hemorrhage, unspecified: Secondary | ICD-10-CM

## 2017-11-19 DIAGNOSIS — N183 Chronic kidney disease, stage 3 (moderate): Secondary | ICD-10-CM | POA: Diagnosis not present

## 2017-11-19 DIAGNOSIS — D125 Benign neoplasm of sigmoid colon: Secondary | ICD-10-CM | POA: Diagnosis not present

## 2017-11-19 DIAGNOSIS — D124 Benign neoplasm of descending colon: Secondary | ICD-10-CM | POA: Diagnosis not present

## 2017-11-19 LAB — CBC
HEMATOCRIT: 35.1 % — AB (ref 39.0–52.0)
HEMATOCRIT: 33.3 % — AB (ref 39.0–52.0)
HEMOGLOBIN: 10.8 g/dL — AB (ref 13.0–17.0)
Hemoglobin: 11.4 g/dL — ABNORMAL LOW (ref 13.0–17.0)
MCH: 28.9 pg (ref 26.0–34.0)
MCH: 29.1 pg (ref 26.0–34.0)
MCHC: 32.5 g/dL (ref 30.0–36.0)
MCHC: 32.4 g/dL (ref 30.0–36.0)
MCV: 88.9 fL (ref 78.0–100.0)
MCV: 89.8 fL (ref 78.0–100.0)
PLATELETS: 349 10*3/uL (ref 150–400)
Platelets: 324 10*3/uL (ref 150–400)
RBC: 3.95 MIL/uL — AB (ref 4.22–5.81)
RBC: 3.71 MIL/uL — AB (ref 4.22–5.81)
RDW: 14.3 % (ref 11.5–15.5)
RDW: 14.4 % (ref 11.5–15.5)
WBC: 7.2 10*3/uL (ref 4.0–10.5)
WBC: 7 10*3/uL (ref 4.0–10.5)

## 2017-11-19 LAB — GLUCOSE, CAPILLARY
GLUCOSE-CAPILLARY: 134 mg/dL — AB (ref 65–99)
GLUCOSE-CAPILLARY: 147 mg/dL — AB (ref 65–99)
Glucose-Capillary: 97 mg/dL (ref 65–99)
Glucose-Capillary: 133 mg/dL — ABNORMAL HIGH (ref 65–99)
Glucose-Capillary: 199 mg/dL — ABNORMAL HIGH (ref 65–99)

## 2017-11-19 LAB — IRON AND TIBC
IRON: 67 ug/dL (ref 45–182)
Saturation Ratios: 16 % — ABNORMAL LOW (ref 17.9–39.5)
TIBC: 412 ug/dL (ref 250–450)
UIBC: 345 ug/dL

## 2017-11-19 LAB — COMPREHENSIVE METABOLIC PANEL
ALT: 22 U/L (ref 17–63)
AST: 25 U/L (ref 15–41)
Albumin: 3.5 g/dL (ref 3.5–5.0)
Alkaline Phosphatase: 35 U/L — ABNORMAL LOW (ref 38–126)
Anion gap: 11 (ref 5–15)
BILIRUBIN TOTAL: 0.6 mg/dL (ref 0.3–1.2)
BUN: 33 mg/dL — AB (ref 6–20)
CALCIUM: 8.9 mg/dL (ref 8.9–10.3)
CHLORIDE: 100 mmol/L — AB (ref 101–111)
CO2: 29 mmol/L (ref 22–32)
CREATININE: 1.95 mg/dL — AB (ref 0.61–1.24)
GFR, EST AFRICAN AMERICAN: 38 mL/min — AB (ref >60)
GFR, EST NON AFRICAN AMERICAN: 32 mL/min — AB (ref >60)
Glucose, Bld: 110 mg/dL — ABNORMAL HIGH (ref 65–99)
Potassium: 3.3 mmol/L — ABNORMAL LOW (ref 3.5–5.1)
Sodium: 140 mmol/L (ref 135–145)
TOTAL PROTEIN: 6.2 g/dL — AB (ref 6.5–8.1)

## 2017-11-19 LAB — VITAMIN B12: Vitamin B-12: 211 pg/mL (ref 180–914)

## 2017-11-19 LAB — SODIUM, URINE, RANDOM: Sodium, Ur: 121 mmol/L

## 2017-11-19 LAB — FERRITIN: FERRITIN: 102 ng/mL (ref 24–336)

## 2017-11-19 MED ORDER — BISACODYL 5 MG PO TBEC: 5.0000 mg | DELAYED_RELEASE_TABLET | Freq: Every day | ORAL | Status: DC | PRN

## 2017-11-19 MED ORDER — PEG-KCL-NACL-NASULF-NA ASC-C 100 G PO SOLR: 1.0000 | Freq: Once | ORAL | Status: DC

## 2017-11-19 MED ORDER — PEG 3350-KCL-NABCB-NACL-NASULF 236 G PO SOLR: 1000.0000 mL | Freq: Once | ORAL | Status: DC

## 2017-11-19 MED ORDER — PEG 3350-KCL-NABCB-NACL-NASULF 236 G PO SOLR
1000.0000 mL | Freq: Once | ORAL | Status: DC
Start: 2017-11-19 — End: 2017-11-19
  Filled 2017-11-19: qty 4000

## 2017-11-19 MED ORDER — METOCLOPRAMIDE HCL 5 MG/ML IJ SOLN
10.0000 mg | Freq: Once | INTRAMUSCULAR | Status: AC
  Administered 2017-11-20: 10 mg via INTRAVENOUS
  Filled 2017-11-19: qty 2

## 2017-11-19 MED ORDER — SODIUM CHLORIDE 0.9 % IV SOLN
INTRAVENOUS | Status: DC
  Administered 2017-11-19 – 2017-11-20 (×3): via INTRAVENOUS

## 2017-11-19 MED ORDER — METOCLOPRAMIDE HCL 5 MG/ML IJ SOLN
10.0000 mg | Freq: Once | INTRAMUSCULAR | Status: AC
  Administered 2017-11-19: 10 mg via INTRAVENOUS
  Filled 2017-11-19: qty 2

## 2017-11-19 MED ORDER — PEG 3350-KCL-NABCB-NACL-NASULF 236 G PO SOLR
1000.0000 mL | Freq: Once | ORAL | Status: AC
Start: 2017-11-19 — End: 2017-11-19
  Administered 2017-11-19: 1000 mL via ORAL
  Filled 2017-11-19: qty 4000

## 2017-11-19 MED ORDER — PEG 3350-KCL-NABCB-NACL-NASULF 236 G PO SOLR
1000.0000 mL | Freq: Once | ORAL | Status: AC
Start: 2017-11-20 — End: 2017-11-20
  Administered 2017-11-20: 1000 mL via ORAL

## 2017-11-19 NOTE — Progress Notes (Signed)
Inpatient Diabetes Program Recommendations  AACE/ADA: New Consensus Statement on Inpatient Glycemic Control (2015)  Target Ranges:  Prepandial:   less than 140 mg/dL      Peak postprandial:   less than 180 mg/dL (1-2 hours)      Critically ill patients:  140 - 180 mg/dL   Lab Results  Component Value Date   GLUCAP 134 (H) 11/19/2017   HGBA1C 8.3 (H) 09/22/2016    Review of Glycemic Control Results for GEROD, CALIGIURI (MRN [id number]) as of 11/19/2017 10:44  Ref. Range 10/03/2016 11:37 11/18/2017 22:27 11/19/2017 00:04 11/19/2017 04:04 11/19/2017 08:11  Glucose-Capillary Latest Ref Range: 65 - 99 mg/dL 245 (H) 173 (H) 150 (H) 97 134 (H)   Inpatient Diabetes Program Recommendations:    Please discontinue Amaryl while in the hospital. Noted received Amaryl this am while NPO. Text page to [place] with recommendations.  Thank you, Nani Gasser. Christie Viscomi, RN, MSN, CDE  Diabetes Coordinator Inpatient Glycemic Control Team Team Pager [phone] (8am-5pm) 11/19/2017 10:48 AM

## 2017-11-19 NOTE — Consult Note (Signed)
[facility] Consult: 8:15 AM 11/19/2017  LOS: 1 day    Referring Provider: Dr Cathlean Sauer  Primary Care Physician:  Levin Erp, MD Primary Gastroenterologist:  Dr. Deatra Ina     Reason for Consultation:  Hematochezia   HPI: Maurice Perkins is a 71 y.o. male.  Hx BPH.  DM 2, uses insulin.     Hepatic steatosis and hepatic hemangioma per 03/2016 CT.  Stable 3.1 cm cyst per 03/2016 CT and 08/2016 MRI.   S/p 09/2015 ex lap with resection of  jejunal perforation.  S/p 09/2016 lap ventral hernia repair   05/2014 EUS.  Lesion in uncinate pancreas appears cystic, mucinous.  Doubt that this is malignant or pre-malignant.  Cytology " cystic mucinous neoplasm".  Pt referred to Dr Barry Dienes for surgical consideration. After discussing case with her "He does not want to undergo a major surgery with major complications for a condition which is asymptomatic" plan was for repeat MRI/MRCPs.   10/2014 Colonoscopy, Dr Deatra Ina, avg risk screening.  6 polyps (tub adenomas without HGD), sigmoid tics.   For a couple of weeks the patient's stomach has been off, it feels unsettled but he is not nauseous.  He started using Metamucil and probiotics but had not had significant change in his bowel habits until about 6 days ago when he had an episode of brown colored diarrhea.  Tuesday evening, after traveling by air all day from [place], he had a dark stool and when he wiped he saw some blood.  At about 3:00 the next day, Wednesday, he passed a fairly large grossly bloody stool.  He did not have any nausea or abdominal pain, weakness, dizziness, palpitations.  He arranged to see Dr. Nyoka Cowden on Thursday morning.  On DRE in the office there was red blood so patient was sent to [facility] for admission.  Patient has not had any more stools or bleeding  since that episode on Wednesday afternoon.  He still denies pain.  No nausea.  No systemic symptoms such as palpitations, chest pain, lightheadedness.  Patient's weight is been stable.  He takes a low-dose aspirin daily but no other nonsteroidals and no blood thinner or antiplatelet medications.  Did take a couple of Excedrin several days ago but this was a one off.  There have been no new adjustments in his medications. Hgb 12.3>> 11.4.  Baseline 13.  MCV 88.  AKI: 38/2.1 >> 33/1.9.   11/2017 renal ultrasound: renal atrophy.  Diffusely fatty liver.         Past Medical History:  Diagnosis Date  . Diabetes ([place])   . Dry skin    pt has skin oil deficiency ischtiosis  . Hyperlipidemia   . Hypertension   . Other general symptoms(780.99)   . Pancreatic mass   . Unspecified disease of pancreas     Past Surgical History:  Procedure Laterality Date  . ABDOMINAL EXPLORATION SURGERY  09/25/2015  . COLONOSCOPY     2004 dr Earlean Shawl  . EUS N/A 05/17/2014   Procedure: UPPER ENDOSCOPIC ULTRASOUND (EUS) LINEAR;  Surgeon: Milus Banister, MD;  Location: [facility];  Service: Endoscopy;  Laterality: N/A;  . INSERTION OF MESH N/A 09/30/2016   Procedure: INSERTION OF MESH;  Surgeon: Stark Klein, MD;  Location: [place];  Service: General;  Laterality: N/A;  . LAPAROTOMY N/A 09/25/2015   Procedure: EXPLORATORY LAPAROTOMY small bowel resection;  Surgeon: Stark Klein, MD;  Location: [place];  Service: General;  Laterality: N/A;  . NASAL SINUS SURGERY  1970's  . pilonodal cyst     in high school-bottom of spine  . SHOULDER SURGERY Right 2000  . VENTRAL HERNIA REPAIR  09/30/2016   LAPROSCOPIC  . VENTRAL HERNIA REPAIR N/A 09/30/2016   Procedure: LAPAROSCOPIC VENTRAL HERNIA;  Surgeon: Stark Klein, MD;  Location: [place];  Service: General;  Laterality: N/A;    Prior to Admission medications   Medication Sig Start Date End Date Taking? Authorizing Provider  ammonium lactate (LAC-HYDRIN) 12 % lotion Apply 1  application topically daily. Applied after patient has bathe 06/11/16  Yes [provider]  aspirin EC 81 MG tablet Take 81 mg by mouth daily.   Yes [provider]  atenolol (TENORMIN) 50 MG tablet Take 25 mg by mouth every morning.    Yes [provider]  atorvastatin (LIPITOR) 40 MG tablet Take 40 mg by mouth daily.   Yes [provider]  fenofibrate 160 MG tablet Take 160 mg by mouth daily.   Yes [provider]  glimepiride (AMARYL) 4 MG tablet Take 4 mg by mouth 2 (two) times daily.   Yes [provider]  insulin aspart (NOVOLOG) 100 UNIT/ML injection Inject 12 Units into the skin daily.    Yes [provider]  Insulin Glargine (LANTUS) 100 UNIT/ML Solostar Pen Inject 60 Units into the skin every morning.    Yes [provider]  losartan-hydrochlorothiazide (HYZAAR) 50-12.5 MG per tablet Take 1 tablet by mouth every morning.    Yes [provider]  metFORMIN (GLUCOPHAGE) 500 MG tablet Take 500-1,000 mg by mouth See admin instructions. 1000 mg at noon and 500 mg in the evening   Yes [provider]  Misc Natural Products (GLUCOSAMINE CHONDROITIN MSM PO) Take 2 tablets by mouth daily.   Yes [provider]  Multiple Vitamin (MULTIVITAMIN WITH MINERALS) TABS tablet Take 1 tablet by mouth daily.   Yes [provider]  Probiotic Product (PROBIOTIC PO) Take 1 capsule by mouth daily.   Yes [provider]  tetrahydrozoline 0.05 % ophthalmic solution Place 1-3 drops into both eyes 3 (three) times daily as needed (for itchy/scratchy eyes).   Yes [provider]  methocarbamol (ROBAXIN) 750 MG tablet Take 1 tablet (750 mg total) by mouth every 8 (eight) hours as needed (use for muscle cramps/pain). Patient not taking: Reported on 11/18/2017 10/03/16   Rolm Bookbinder, MD  oxyCODONE (OXY IR/ROXICODONE) 5 MG immediate release tablet Take 1-2 tablets (5-10 mg total) by mouth every 4  (four) hours as needed for moderate pain. Patient not taking: Reported on 11/18/2017 10/03/16   Coralie Keens, MD    Scheduled Meds: . atenolol  25 mg Oral q morning - 10a  . atorvastatin  40 mg Oral q1800  . glimepiride  4 mg Oral BID WC  . insulin aspart  0-9 Units Subcutaneous Q4H   Infusions: . sodium chloride 75 mL/hr at 11/18/17 2235   PRN Meds: acetaminophen **OR** acetaminophen, methocarbamol, oxyCODONE   Allergies as of 11/18/2017 - Review Complete 11/18/2017  Allergen Reaction Noted  .  Penicillins Hives   . Hydrocodone Nausea Only 11/18/2017    Family History  Problem Relation Age of Onset  . Heart disease Mother   . Breast cancer Mother        breast  . Colon cancer Neg Hx   . Stomach cancer Neg Hx   . Ulcerative colitis Neg Hx     Social History   Socioeconomic History  . Marital status: Married    Spouse name: Not on file  . Number of children: 4  . Years of education: Not on file  . Highest education level: Not on file  Social Needs  . Financial resource strain: Not on file  . Food insecurity - worry: Not on file  . Food insecurity - inability: Not on file  . Transportation needs - medical: Not on file  . Transportation needs - non-medical: Not on file  Occupational History  . Occupation: Insurance account manager  Tobacco Use  . Smoking status: Former Smoker    Packs/day: 2.00    Years: 15.00    Pack years: 30.00    Types: Cigarettes    Last attempt to quit: 12/15/1971    Years since quitting: 45.9  . Smokeless tobacco: Never Used  Substance and Sexual Activity  . Alcohol use: No    Alcohol/week: 0.0 oz  . Drug use: No  . Sexual activity: Not on file  Other Topics Concern  . Not on file  Social History Narrative  . Not on file    REVIEW OF SYSTEMS: Constitutional: Feels well.  No fatigue or weakness.  He and his wife exercise regularly and he does not have any exertional dyspnea or fatigue. ENT:  No nose bleeds Pulm: No cough, no  dyspnea. CV:  No palpitations, no LE edema.  No chest pain GU:  No hematuria, no frequency GI: No dysphagia..  See HPI Heme: Has never had to take iron, B12 or folate supplements.  No previous issues with anemia.  No recent problems with excessive bleeding or bruising. Transfusions: Never received blood product transfusions before. Neuro:  No headaches, no peripheral tingling or numbness Derm:  No itching, no rash or sores.  Endocrine:  No sweats or chills.  No polyuria or dysuria Immunization: Did not inquire as to recent immunizations. Travel: Recently returned from a trip visiting his daughter and her family in [place].   PHYSICAL EXAM: Vital signs in last 24 hours: Vitals:   11/18/17 2207 11/19/17 0406  BP: 122/63 110/63  Pulse: 66 62  Resp: 18 18  Temp: 98.3 F (36.8 C) 98.1 F (36.7 C)  SpO2: 97% 97%   Wt Readings from Last 3 Encounters:  11/19/17 94.9 kg (209 lb 4.8 oz)  09/30/16 103.9 kg (229 lb)  09/22/16 99.1 kg (218 lb 6.4 oz)    General: Pleasant, well-appearing WM who is alert and comfortable. Head: No asymmetry or facial edema.  No signs of head trauma. Eyes: No scleral icterus.  No conjunctival pallor.  EOMI. Ears: Not hard of hearing. Nose: No discharge or congestion. Mouth: Good dental repair, extensive dental work.  Tongue midline.  Oral mucosa pink, moist, clear. Neck: No JVD, no masses.  No thyromegaly. Lungs: Nonlabored breathing.  No cough.  Lungs clear to auscultation bilaterally Heart: RRR.  No MRG.  S1, S2 present. Abdomen: Soft.  Nonobese.  Active bowel sounds.  Nonspecific firmness in the right abdomen, which is not present on the left.  No discrete masses.  No bruits..   Rectal:  Deferred rectal exam.  He had red blood on DRE yesterday in MD office. Musc/Skeltl: No joint redness, swelling or significant deformity. Extremities: Feet warm and well perfused.  No CCE. Neurologic: Alert.  Oriented x3.  Good historian.  Moves all 4 limbs, no limb  weakness or tremor. Skin: No rashes or sores.  No suspicious lesions. Tattoos: None Nodes: No cervical adenopathy. Psych: Calm, pleasant, affect normal.  Intake/Output from previous day: 12/06 0701 - 12/07 0700 In: 341.3 [I.V.:341.3] Out: 250 [Urine:250] Intake/Output this shift: No intake/output data recorded.  LAB RESULTS: Recent Labs    11/18/17 1635 11/19/17 0252  WBC 8.9 7.2  HGB 12.3* 11.4*  HCT 38.0* 35.1*  PLT 408* 349   BMET Lab Results  Component Value Date   NA 140 11/19/2017   NA 140 11/18/2017   NA 135 10/03/2016   K 3.3 (L) 11/19/2017   K 4.7 11/18/2017   K 3.9 10/03/2016   CL 100 (L) 11/19/2017   CL 99 (L) 11/18/2017   CL 100 (L) 10/03/2016   CO2 29 11/19/2017   CO2 28 11/18/2017   CO2 27 10/03/2016   GLUCOSE 110 (H) 11/19/2017   GLUCOSE 282 (H) 11/18/2017   GLUCOSE 192 (H) 10/03/2016   BUN 33 (H) 11/19/2017   BUN 38 (H) 11/18/2017   BUN 22 (H) 10/03/2016   CREATININE 1.95 (H) 11/19/2017   CREATININE 2.16 (H) 11/18/2017   CREATININE 1.66 (H) 10/03/2016   CALCIUM 8.9 11/19/2017   CALCIUM 9.5 11/18/2017   CALCIUM 8.3 (L) 10/03/2016   LFT Recent Labs    11/18/17 1635 11/19/17 0252  PROT 6.6 6.2*  ALBUMIN 4.0 3.5  AST 32 25  ALT 26 22  ALKPHOS 43 35*  BILITOT 0.3 0.6   PT/INR Lab Results  Component Value Date   INR 1.00 11/18/2017   INR 1.07 09/25/2015   INR 1.1 (H) 05/09/2014   Hepatitis Panel No results for input(s): HEPBSAG, HCVAB, HEPAIGM, HEPBIGM in the last 72 hours. C-Diff No components found for: CDIFF Lipase     Component Value Date/Time   LIPASE 14 (L) 09/25/2015 1741    Drugs of Abuse  No results found for: LABOPIA, COCAINSCRNUR, LABBENZ, AMPHETMU, THCU, LABBARB   RADIOLOGY STUDIES: US Renal  Result Date: 11/18/2017 CLINICAL DATA:  71 year old male with acute renal failure. EXAM: RENAL / URINARY TRACT ULTRASOUND COMPLETE COMPARISON:  Abdominal CT dated 03/16/2016 and MRI dated 08/19/2016 FINDINGS: Right  Kidney: Length: 11.1 cm. Normal echogenicity. Mild parenchymal atrophy. There is no hydronephrosis or echogenic stone. A 3 cm midpole cyst noted. Left Kidney: Length: 11.2 cm. Normal echogenicity. Mild parenchymal atrophy. No hydronephrosis or echogenic stone. A 1.5 cm inferior pole cyst. Bladder: Appears normal for degree of bladder distention. The prostate gland measures 4.4 cm in greatest dimension with a total volume of 32 cc. There is diffuse fatty infiltration of the liver. IMPRESSION: 1. No hydronephrosis or echogenic stone. 2. Fatty liver. Electronically Signed   By: Anner Crete M.D.   On: 11/18/2017 21:41     IMPRESSION:   *   Painless hematochezia.  Suspect diverticular bleed. History of adenomatous colon polyps and sigmoid diverticulosis on 2015 colonoscopy.  He is due for repeat surveillance colonoscopy. Hgb has not dropped significantly.   *   History pancreatic cystic mucinous neoplasm.  Stable as of latest 08/2016 MRI/MRCP  *  Fatty liver per imaging.  Platelets and coags normal.  No ETOH h, does not drink.    *  IDDM.  *  AKI, improved. Mild renal atrophy on ultrasound    PLAN:     *  Colonoscopy, possible EGD tomorrow.  Clears today.  Prep tonite.  Pt aggreeable.  CBC in AM.     Azucena Freed  11/19/2017, 8:15 AM Pager: [phone]  ________________________________________________________________________  Velora Heckler GI MD note:  I personally examined the patient, reviewed the data and agree with the assessment and plan described above.  Pretty low volume, painless red rectal bleeding. Most suspicious for diverticular bleed. Planning for colonoscopy tomorrow AM?   Owens Loffler, MD [facility] Pager [phone]

## 2017-11-19 NOTE — Progress Notes (Signed)
PROGRESS NOTE    Maurice Perkins  ZOX:[id number] DOB: December 18, 1945 DOA: 11/18/2017 PCP: Levin Erp, MD    Brief Narrative:  71 year old male who presents with rectal bleeding. He does have a significant past medical history for hypertension, dyslipidemia, type 2 diabetes mellitus and a pancreatic mass. Patient reported hematochezia, mild to moderate which has been persistent for the last 3 days prior to hospitalization. He called his primary care physician and he was advised to come to the hospital for further evaluation. On initial physical examination blood pressure 107/67, heart rate 77, temperature 97.9, respiratory rate 16, oxygenation 100%. Moist mucous membranes, lungs clear to auscultation bilaterally, no wheezes rales or rhonchi, heart S1-S2 present rhythmic, abdomen was soft, nontender, no lower extremity edema. Sodium 140, potassium 4.7, chloride 98, bicarbonate 28, glucose 282, BUN 8, creatinine 2.16 white count 8.9, hemoglobin 12.3, hematocrit 38.0, platelets 408,  Iron 67, TIBC 412, transferrin saturation 16, ferritin of 102. INR 1.0. Fecal occult blood positive.   Patient was admitted to the hospital working diagnosis of lower GI bleed.   Assessment & Plan:   Principal Problem:   Rectal bleeding Active Problems:   Anemia   Hyperglycemia   ARF (acute renal failure) ([place])   1. Lower GI bleeding. Will continue supportive medical therapy, no signs of ongoing bleeding, hb and hct have remain stable at 10.8 and 33,3. Will continue clear liquid diet, npo after midnight, bowel preparation today for colonoscopy in am. Suspected diverticular bleed.   2. Anemia of chronic disease and acute blood loss due to lower GI bleed. Iron panel c/w anemia of chronic disease and iron deficiency, will start iron supplementation to target transferrin saturation of 20%. No need for PRBC transfusion today.   3. AKI on CKD stage 3. Old records personally reviewed, base cr 1,7, with a calculated GFR of  39. This am cr down to 1,95 with K at 3,3 and serum bicarbonate 29, will follow on renal panel in am, continue gentle hydration.   4. Hypertension. Continue blood pressure control with atenolol.   5. T2DM. Will continue glucose cover and monitoring, will hold on all oral hypoglycemic agents, patient on 60 units of glargine at home. Capillary glucose 173, 150, 97, 134, 133, 147. Will continue to hold on long acting insulin for now and continue to calculate requirements.   DVT prophylaxis: scd  Code Status:  full Family Communication: No family at the bedside  Disposition Plan:  home   Consultants:   Gastroenterology   Procedures:     Antimicrobials:       Subjective: Patient feeling better, no nausea or vomiting, no active bleeding, no chest pain or dyspnea.   Objective: Vitals:   11/18/17 2030 11/18/17 2207 11/19/17 0406 11/19/17 1414  BP: 120/72 122/63 110/63 106/61  Pulse: 76 66 62 63  Resp: (!) 22 18 18 15   Temp:  98.3 F (36.8 C) 98.1 F (36.7 C) 97.9 F (36.6 C)  TempSrc:  Oral Oral   SpO2: 98% 97% 97% 98%  Weight:  94.9 kg (209 lb 4.8 oz) 94.9 kg (209 lb 4.8 oz)   Height:  6\' 3"  (1.905 m)      Intake/Output Summary (Last 24 hours) at 11/19/2017 1615 Last data filed at 11/19/2017 1527 Gross per 24 hour  Intake 341.25 ml  Output 875 ml  Net -533.75 ml   Filed Weights   11/18/17 2207 11/19/17 0406  Weight: 94.9 kg (209 lb 4.8 oz) 94.9 kg (209 lb 4.8 oz)  Examination:   General: Not in pain or dyspnea, deconditioned Neurology: Awake and alert, non focal  E ENT: mild pallor, no icterus, oral mucosa moist Cardiovascular: No JVD. S1-S2 present, rhythmic, no gallops, rubs, or murmurs. No lower extremity edema. Pulmonary: vesicular breath sounds bilaterally, adequate air movement, no wheezing, rhonchi or rales. Gastrointestinal. Abdomen flat, no organomegaly, non tender, no rebound or guarding Skin. No rashes Musculoskeletal: no joint  deformities     Data Reviewed: I have personally reviewed following labs and imaging studies  CBC: Recent Labs  Lab 11/18/17 1635 11/19/17 0252 11/19/17 1319  WBC 8.9 7.2 7.0  HGB 12.3* 11.4* 10.8*  HCT 38.0* 35.1* 33.3*  MCV 89.6 88.9 89.8  PLT 408* 349 366   Basic Metabolic Panel: Recent Labs  Lab 11/18/17 1635 11/19/17 0252  NA 140 140  K 4.7 3.3*  CL 99* 100*  CO2 28 29  GLUCOSE 282* 110*  BUN 38* 33*  CREATININE 2.16* 1.95*  CALCIUM 9.5 8.9   GFR: Estimated Creatinine Clearance: 40.3 mL/min (A) (by C-G formula based on SCr of 1.95 mg/dL (H)). Liver Function Tests: Recent Labs  Lab 11/18/17 1635 11/19/17 0252  AST 32 25  ALT 26 22  ALKPHOS 43 35*  BILITOT 0.3 0.6  PROT 6.6 6.2*  ALBUMIN 4.0 3.5   No results for input(s): LIPASE, AMYLASE in the last 168 hours. No results for input(s): AMMONIA in the last 168 hours. Coagulation Profile: Recent Labs  Lab 11/18/17 1635  INR 1.00   Cardiac Enzymes: No results for input(s): CKTOTAL, CKMB, CKMBINDEX, TROPONINI in the last 168 hours. BNP (last 3 results) No results for input(s): PROBNP in the last 8760 hours. HbA1C: No results for input(s): HGBA1C in the last 72 hours. CBG: Recent Labs  Lab 11/18/17 2227 11/19/17 0004 11/19/17 0404 11/19/17 0811 11/19/17 1153  GLUCAP 173* 150* 97 134* 133*   Lipid Profile: No results for input(s): CHOL, HDL, LDLCALC, TRIG, CHOLHDL, LDLDIRECT in the last 72 hours. Thyroid Function Tests: No results for input(s): TSH, T4TOTAL, FREET4, T3FREE, THYROIDAB in the last 72 hours. Anemia Panel: Recent Labs    11/19/17 0252  VITAMINB12 211  FERRITIN 102  TIBC 412  IRON 67      Radiology Studies: I have reviewed all of the imaging during this hospital visit personally     Scheduled Meds: . atenolol  25 mg Oral q morning - 10a  . atorvastatin  40 mg Oral q1800  . glimepiride  4 mg Oral BID WC  . insulin aspart  0-9 Units Subcutaneous Q4H  .  metoCLOPramide (REGLAN) injection  10 mg Intravenous Once   Followed by  . [START ON 11/20/2017] metoCLOPramide (REGLAN) injection  10 mg Intravenous Once  . [START ON 11/20/2017] polyethylene glycol  1,000 mL Oral Once   And  . polyethylene glycol  1,000 mL Oral Once   Continuous Infusions: . sodium chloride 75 mL/hr at 11/19/17 1227     LOS: 1 day        Tawni Millers, MD [facility] Pager [phone]

## 2017-11-19 NOTE — H&P (View-Only) (Signed)
[facility] Consult: 8:15 AM 11/19/2017  LOS: 1 day    Referring Provider: Dr Cathlean Sauer  Primary Care Physician:  Levin Erp, MD Primary Gastroenterologist:  Dr. Deatra Ina     Reason for Consultation:  Hematochezia   HPI: Maurice Perkins is a 71 y.o. male.  Hx BPH.  DM 2, uses insulin.     Hepatic steatosis and hepatic hemangioma per 03/2016 CT.  Stable 3.1 cm cyst per 03/2016 CT and 08/2016 MRI.   S/p 09/2015 ex lap with resection of  jejunal perforation.  S/p 09/2016 lap ventral hernia repair   05/2014 EUS.  Lesion in uncinate pancreas appears cystic, mucinous.  Doubt that this is malignant or pre-malignant.  Cytology " cystic mucinous neoplasm".  Pt referred to Dr Barry Dienes for surgical consideration. After discussing case with her "He does not want to undergo a major surgery with major complications for a condition which is asymptomatic" plan was for repeat MRI/MRCPs.   10/2014 Colonoscopy, Dr Deatra Ina, avg risk screening.  6 polyps (tub adenomas without HGD), sigmoid tics.   For a couple of weeks the patient's stomach has been off, it feels unsettled but he is not nauseous.  He started using Metamucil and probiotics but had not had significant change in his bowel habits until about 6 days ago when he had an episode of brown colored diarrhea.  Tuesday evening, after traveling by air all day from [place], he had a dark stool and when he wiped he saw some blood.  At about 3:00 the next day, Wednesday, he passed a fairly large grossly bloody stool.  He did not have any nausea or abdominal pain, weakness, dizziness, palpitations.  He arranged to see Dr. Nyoka Cowden on Thursday morning.  On DRE in the office there was red blood so patient was sent to [facility] for admission.  Patient has not had any more stools or bleeding  since that episode on Wednesday afternoon.  He still denies pain.  No nausea.  No systemic symptoms such as palpitations, chest pain, lightheadedness.  Patient's weight is been stable.  He takes a low-dose aspirin daily but no other nonsteroidals and no blood thinner or antiplatelet medications.  Did take a couple of Excedrin several days ago but this was a one off.  There have been no new adjustments in his medications. Hgb 12.3>> 11.4.  Baseline 13.  MCV 88.  AKI: 38/2.1 >> 33/1.9.   11/2017 renal ultrasound: renal atrophy.  Diffusely fatty liver.         Past Medical History:  Diagnosis Date  . Diabetes ([place])   . Dry skin    pt has skin oil deficiency ischtiosis  . Hyperlipidemia   . Hypertension   . Other general symptoms(780.99)   . Pancreatic mass   . Unspecified disease of pancreas     Past Surgical History:  Procedure Laterality Date  . ABDOMINAL EXPLORATION SURGERY  09/25/2015  . COLONOSCOPY     2004 dr Earlean Shawl  . EUS N/A 05/17/2014   Procedure: UPPER ENDOSCOPIC ULTRASOUND (EUS) LINEAR;  Surgeon: Milus Banister, MD;  Location: [facility];  Service: Endoscopy;  Laterality: N/A;  . INSERTION OF MESH N/A 09/30/2016   Procedure: INSERTION OF MESH;  Surgeon: Stark Klein, MD;  Location: [facility];  Service: General;  Laterality: N/A;  . LAPAROTOMY N/A 09/25/2015   Procedure: EXPLORATORY LAPAROTOMY small bowel resection;  Surgeon: Stark Klein, MD;  Location: [facility];  Service: General;  Laterality: N/A;  . NASAL SINUS SURGERY  1970's  . pilonodal cyst     in high school-bottom of spine  . SHOULDER SURGERY Right 2000  . VENTRAL HERNIA REPAIR  09/30/2016   LAPROSCOPIC  . VENTRAL HERNIA REPAIR N/A 09/30/2016   Procedure: LAPAROSCOPIC VENTRAL HERNIA;  Surgeon: Stark Klein, MD;  Location: [facility];  Service: General;  Laterality: N/A;    Prior to Admission medications   Medication Sig Start Date End Date Taking? Authorizing Provider  ammonium lactate (LAC-HYDRIN) 12 % lotion Apply 1  application topically daily. Applied after patient has bathe 06/11/16  Yes [provider]  aspirin EC 81 MG tablet Take 81 mg by mouth daily.   Yes [provider]  atenolol (TENORMIN) 50 MG tablet Take 25 mg by mouth every morning.    Yes [provider]  atorvastatin (LIPITOR) 40 MG tablet Take 40 mg by mouth daily.   Yes [provider]  fenofibrate 160 MG tablet Take 160 mg by mouth daily.   Yes [provider]  glimepiride (AMARYL) 4 MG tablet Take 4 mg by mouth 2 (two) times daily.   Yes [provider]  insulin aspart (NOVOLOG) 100 UNIT/ML injection Inject 12 Units into the skin daily.    Yes [provider]  Insulin Glargine (LANTUS) 100 UNIT/ML Solostar Pen Inject 60 Units into the skin every morning.    Yes [provider]  losartan-hydrochlorothiazide (HYZAAR) 50-12.5 MG per tablet Take 1 tablet by mouth every morning.    Yes [provider]  metFORMIN (GLUCOPHAGE) 500 MG tablet Take 500-1,000 mg by mouth See admin instructions. 1000 mg at noon and 500 mg in the evening   Yes [provider]  Misc Natural Products (GLUCOSAMINE CHONDROITIN MSM PO) Take 2 tablets by mouth daily.   Yes [provider]  Multiple Vitamin (MULTIVITAMIN WITH MINERALS) TABS tablet Take 1 tablet by mouth daily.   Yes [provider]  Probiotic Product (PROBIOTIC PO) Take 1 capsule by mouth daily.   Yes [provider]  tetrahydrozoline 0.05 % ophthalmic solution Place 1-3 drops into both eyes 3 (three) times daily as needed (for itchy/scratchy eyes).   Yes [provider]  methocarbamol (ROBAXIN) 750 MG tablet Take 1 tablet (750 mg total) by mouth every 8 (eight) hours as needed (use for muscle cramps/pain). Patient not taking: Reported on 11/18/2017 10/03/16   Rolm Bookbinder, MD  oxyCODONE (OXY IR/ROXICODONE) 5 MG immediate release tablet Take 1-2 tablets (5-10 mg total) by mouth every 4  (four) hours as needed for moderate pain. Patient not taking: Reported on 11/18/2017 10/03/16   Coralie Keens, MD    Scheduled Meds: . atenolol  25 mg Oral q morning - 10a  . atorvastatin  40 mg Oral q1800  . glimepiride  4 mg Oral BID WC  . insulin aspart  0-9 Units Subcutaneous Q4H   Infusions: . sodium chloride 75 mL/hr at 11/18/17 2235   PRN Meds: acetaminophen **OR** acetaminophen, methocarbamol, oxyCODONE   Allergies as of 11/18/2017 - Review Complete 11/18/2017  Allergen Reaction Noted  .  Penicillins Hives   . Hydrocodone Nausea Only 11/18/2017    Family History  Problem Relation Age of Onset  . Heart disease Mother   . Breast cancer Mother        breast  . Colon cancer Neg Hx   . Stomach cancer Neg Hx   . Ulcerative colitis Neg Hx     Social History   Socioeconomic History  . Marital status: Married    Spouse name: Not on file  . Number of children: 4  . Years of education: Not on file  . Highest education level: Not on file  Social Needs  . Financial resource strain: Not on file  . Food insecurity - worry: Not on file  . Food insecurity - inability: Not on file  . Transportation needs - medical: Not on file  . Transportation needs - non-medical: Not on file  Occupational History  . Occupation: Insurance account manager  Tobacco Use  . Smoking status: Former Smoker    Packs/day: 2.00    Years: 15.00    Pack years: 30.00    Types: Cigarettes    Last attempt to quit: 12/15/1971    Years since quitting: 45.9  . Smokeless tobacco: Never Used  Substance and Sexual Activity  . Alcohol use: No    Alcohol/week: 0.0 oz  . Drug use: No  . Sexual activity: Not on file  Other Topics Concern  . Not on file  Social History Narrative  . Not on file    REVIEW OF SYSTEMS: Constitutional: Feels well.  No fatigue or weakness.  He and his wife exercise regularly and he does not have any exertional dyspnea or fatigue. ENT:  No nose bleeds Pulm: No cough, no  dyspnea. CV:  No palpitations, no LE edema.  No chest pain GU:  No hematuria, no frequency GI: No dysphagia..  See HPI Heme: Has never had to take iron, B12 or folate supplements.  No previous issues with anemia.  No recent problems with excessive bleeding or bruising. Transfusions: Never received blood product transfusions before. Neuro:  No headaches, no peripheral tingling or numbness Derm:  No itching, no rash or sores.  Endocrine:  No sweats or chills.  No polyuria or dysuria Immunization: Did not inquire as to recent immunizations. Travel: Recently returned from a trip visiting his daughter and her family in [place].   PHYSICAL EXAM: Vital signs in last 24 hours: Vitals:   11/18/17 2207 11/19/17 0406  BP: 122/63 110/63  Pulse: 66 62  Resp: 18 18  Temp: 98.3 F (36.8 C) 98.1 F (36.7 C)  SpO2: 97% 97%   Wt Readings from Last 3 Encounters:  11/19/17 94.9 kg (209 lb 4.8 oz)  09/30/16 103.9 kg (229 lb)  09/22/16 99.1 kg (218 lb 6.4 oz)    General: Pleasant, well-appearing WM who is alert and comfortable. Head: No asymmetry or facial edema.  No signs of head trauma. Eyes: No scleral icterus.  No conjunctival pallor.  EOMI. Ears: Not hard of hearing. Nose: No discharge or congestion. Mouth: Good dental repair, extensive dental work.  Tongue midline.  Oral mucosa pink, moist, clear. Neck: No JVD, no masses.  No thyromegaly. Lungs: Nonlabored breathing.  No cough.  Lungs clear to auscultation bilaterally Heart: RRR.  No MRG.  S1, S2 present. Abdomen: Soft.  Nonobese.  Active bowel sounds.  Nonspecific firmness in the right abdomen, which is not present on the left.  No discrete masses.  No bruits..   Rectal:  Deferred rectal exam.  He had red blood on DRE yesterday in MD office. Musc/Skeltl: No joint redness, swelling or significant deformity. Extremities: Feet warm and well perfused.  No CCE. Neurologic: Alert.  Oriented x3.  Good historian.  Moves all 4 limbs, no limb  weakness or tremor. Skin: No rashes or sores.  No suspicious lesions. Tattoos: None Nodes: No cervical adenopathy. Psych: Calm, pleasant, affect normal.  Intake/Output from previous day: 12/06 0701 - 12/07 0700 In: 341.3 [I.V.:341.3] Out: 250 [Urine:250] Intake/Output this shift: No intake/output data recorded.  LAB RESULTS: Recent Labs    11/18/17 1635 11/19/17 0252  WBC 8.9 7.2  HGB 12.3* 11.4*  HCT 38.0* 35.1*  PLT 408* 349   BMET Lab Results  Component Value Date   NA 140 11/19/2017   NA 140 11/18/2017   NA 135 10/03/2016   K 3.3 (L) 11/19/2017   K 4.7 11/18/2017   K 3.9 10/03/2016   CL 100 (L) 11/19/2017   CL 99 (L) 11/18/2017   CL 100 (L) 10/03/2016   CO2 29 11/19/2017   CO2 28 11/18/2017   CO2 27 10/03/2016   GLUCOSE 110 (H) 11/19/2017   GLUCOSE 282 (H) 11/18/2017   GLUCOSE 192 (H) 10/03/2016   BUN 33 (H) 11/19/2017   BUN 38 (H) 11/18/2017   BUN 22 (H) 10/03/2016   CREATININE 1.95 (H) 11/19/2017   CREATININE 2.16 (H) 11/18/2017   CREATININE 1.66 (H) 10/03/2016   CALCIUM 8.9 11/19/2017   CALCIUM 9.5 11/18/2017   CALCIUM 8.3 (L) 10/03/2016   LFT Recent Labs    11/18/17 1635 11/19/17 0252  PROT 6.6 6.2*  ALBUMIN 4.0 3.5  AST 32 25  ALT 26 22  ALKPHOS 43 35*  BILITOT 0.3 0.6   PT/INR Lab Results  Component Value Date   INR 1.00 11/18/2017   INR 1.07 09/25/2015   INR 1.1 (H) 05/09/2014   Hepatitis Panel No results for input(s): HEPBSAG, HCVAB, HEPAIGM, HEPBIGM in the last 72 hours. C-Diff No components found for: CDIFF Lipase     Component Value Date/Time   LIPASE 14 (L) 09/25/2015 1741    Drugs of Abuse  No results found for: LABOPIA, COCAINSCRNUR, LABBENZ, AMPHETMU, THCU, LABBARB   RADIOLOGY STUDIES: US Renal  Result Date: 11/18/2017 CLINICAL DATA:  71 year old male with acute renal failure. EXAM: RENAL / URINARY TRACT ULTRASOUND COMPLETE COMPARISON:  Abdominal CT dated 03/16/2016 and MRI dated 08/19/2016 FINDINGS: Right  Kidney: Length: 11.1 cm. Normal echogenicity. Mild parenchymal atrophy. There is no hydronephrosis or echogenic stone. A 3 cm midpole cyst noted. Left Kidney: Length: 11.2 cm. Normal echogenicity. Mild parenchymal atrophy. No hydronephrosis or echogenic stone. A 1.5 cm inferior pole cyst. Bladder: Appears normal for degree of bladder distention. The prostate gland measures 4.4 cm in greatest dimension with a total volume of 32 cc. There is diffuse fatty infiltration of the liver. IMPRESSION: 1. No hydronephrosis or echogenic stone. 2. Fatty liver. Electronically Signed   By: Anner Crete M.D.   On: 11/18/2017 21:41     IMPRESSION:   *   Painless hematochezia.  Suspect diverticular bleed. History of adenomatous colon polyps and sigmoid diverticulosis on 2015 colonoscopy.  He is due for repeat surveillance colonoscopy. Hgb has not dropped significantly.   *   History pancreatic cystic mucinous neoplasm.  Stable as of latest 08/2016 MRI/MRCP  *  Fatty liver per imaging.  Platelets and coags normal.  No ETOH h, does not drink.    *  IDDM.  *  AKI, improved. Mild renal atrophy on ultrasound    PLAN:     *  Colonoscopy, possible EGD tomorrow.  Clears today.  Prep tonite.  Pt aggreeable.  CBC in AM.     Azucena Freed  11/19/2017, 8:15 AM Pager: [phone]  ________________________________________________________________________  Velora Heckler GI MD note:  I personally examined the patient, reviewed the data and agree with the assessment and plan described above.  Pretty low volume, painless red rectal bleeding. Most suspicious for diverticular bleed. Planning for colonoscopy tomorrow AM?   Owens Loffler, MD [facility] Pager [phone]

## 2017-11-20 ENCOUNTER — Encounter (HOSPITAL_COMMUNITY): Payer: Self-pay | Admitting: Gastroenterology

## 2017-11-20 ENCOUNTER — Inpatient Hospital Stay (HOSPITAL_COMMUNITY): Payer: Medicare Other | Admitting: Anesthesiology

## 2017-11-20 ENCOUNTER — Encounter (HOSPITAL_COMMUNITY)
Admission: EM | Disposition: A | Discharge status: Home / Self Care | Payer: Self-pay | Source: Home / Self Care | Attending: Internal Medicine

## 2017-11-20 DIAGNOSIS — D125 Benign neoplasm of sigmoid colon: Secondary | ICD-10-CM

## 2017-11-20 DIAGNOSIS — D123 Benign neoplasm of transverse colon: Secondary | ICD-10-CM

## 2017-11-20 DIAGNOSIS — E1122 Type 2 diabetes mellitus with diabetic chronic kidney disease: Secondary | ICD-10-CM

## 2017-11-20 DIAGNOSIS — K5730 Diverticulosis of large intestine without perforation or abscess without bleeding: Secondary | ICD-10-CM

## 2017-11-20 DIAGNOSIS — K625 Hemorrhage of anus and rectum: Secondary | ICD-10-CM | POA: Diagnosis not present

## 2017-11-20 DIAGNOSIS — D124 Benign neoplasm of descending colon: Secondary | ICD-10-CM | POA: Diagnosis not present

## 2017-11-20 DIAGNOSIS — D120 Benign neoplasm of cecum: Secondary | ICD-10-CM

## 2017-11-20 DIAGNOSIS — Z794 Long term (current) use of insulin: Secondary | ICD-10-CM

## 2017-11-20 DIAGNOSIS — K922 Gastrointestinal hemorrhage, unspecified: Secondary | ICD-10-CM | POA: Diagnosis not present

## 2017-11-20 DIAGNOSIS — K5731 Diverticulosis of large intestine without perforation or abscess with bleeding: Secondary | ICD-10-CM | POA: Diagnosis not present

## 2017-11-20 DIAGNOSIS — K635 Polyp of colon: Secondary | ICD-10-CM

## 2017-11-20 DIAGNOSIS — D62 Acute posthemorrhagic anemia: Secondary | ICD-10-CM | POA: Diagnosis not present

## 2017-11-20 DIAGNOSIS — D128 Benign neoplasm of rectum: Secondary | ICD-10-CM

## 2017-11-20 DIAGNOSIS — N179 Acute kidney failure, unspecified: Secondary | ICD-10-CM | POA: Diagnosis not present

## 2017-11-20 DIAGNOSIS — N183 Chronic kidney disease, stage 3 (moderate): Secondary | ICD-10-CM

## 2017-11-20 HISTORY — PX: COLONOSCOPY: SHX5424

## 2017-11-20 LAB — CBC WITH DIFFERENTIAL/PLATELET
BASOS ABS: 0 10*3/uL (ref 0.0–0.1)
BASOS PCT: 0 %
EOS ABS: 0.1 10*3/uL (ref 0.0–0.7)
Eosinophils Relative: 2 %
HEMATOCRIT: 34.7 % — AB (ref 39.0–52.0)
Hemoglobin: 11.7 g/dL — ABNORMAL LOW (ref 13.0–17.0)
Lymphocytes Relative: 22 %
Lymphs Abs: 1.9 10*3/uL (ref 0.7–4.0)
MCH: 29.5 pg (ref 26.0–34.0)
MCHC: 33.7 g/dL (ref 30.0–36.0)
MCV: 87.4 fL (ref 78.0–100.0)
Monocytes Absolute: 0.7 10*3/uL (ref 0.1–1.0)
Monocytes Relative: 8 %
NEUTROS ABS: 5.7 10*3/uL (ref 1.7–7.7)
Neutrophils Relative %: 68 %
PLATELETS: 299 10*3/uL (ref 150–400)
RBC: 3.97 MIL/uL — ABNORMAL LOW (ref 4.22–5.81)
RDW: 13.9 % (ref 11.5–15.5)
WBC: 8.3 10*3/uL (ref 4.0–10.5)

## 2017-11-20 LAB — BASIC METABOLIC PANEL
ANION GAP: 11 (ref 5–15)
BUN: 20 mg/dL (ref 6–20)
CALCIUM: 8.5 mg/dL — AB (ref 8.9–10.3)
CO2: 25 mmol/L (ref 22–32)
CREATININE: 1.71 mg/dL — AB (ref 0.61–1.24)
Chloride: 103 mmol/L (ref 101–111)
GFR, EST AFRICAN AMERICAN: 44 mL/min — AB (ref >60)
GFR, EST NON AFRICAN AMERICAN: 38 mL/min — AB (ref >60)
GLUCOSE: 150 mg/dL — AB (ref 65–99)
Potassium: 3.7 mmol/L (ref 3.5–5.1)
Sodium: 139 mmol/L (ref 135–145)

## 2017-11-20 LAB — CALCIUM / CREATININE RATIO, URINE
CALCIUM UR: 6.8 mg/dL
Calcium/Creat.Ratio: 61 mg/g creat (ref 0–260)
Creatinine, Urine: 111.1 mg/dL

## 2017-11-20 LAB — GLUCOSE, CAPILLARY
GLUCOSE-CAPILLARY: 125 mg/dL — AB (ref 65–99)
GLUCOSE-CAPILLARY: 137 mg/dL — AB (ref 65–99)
GLUCOSE-CAPILLARY: 146 mg/dL — AB (ref 65–99)

## 2017-11-20 SURGERY — COLONOSCOPY
Anesthesia: Monitor Anesthesia Care

## 2017-11-20 MED ORDER — PROPOFOL 10 MG/ML IV BOLUS
INTRAVENOUS | Status: DC | PRN
  Administered 2017-11-20: 80 mg via INTRAVENOUS

## 2017-11-20 MED ORDER — PHENYLEPHRINE HCL 10 MG/ML IJ SOLN
INTRAMUSCULAR | Status: DC | PRN
  Administered 2017-11-20: 40 ug/min via INTRAVENOUS

## 2017-11-20 MED ORDER — PROPOFOL 500 MG/50ML IV EMUL
INTRAVENOUS | Status: DC | PRN
  Administered 2017-11-20: 65 ug/kg/min via INTRAVENOUS

## 2017-11-20 MED ORDER — POLYETHYLENE GLYCOL 3350 17 G PO PACK: 17.0000 g | PACK | Freq: Every day | ORAL | Status: DC

## 2017-11-20 MED ORDER — FERROUS SULFATE 325 (65 FE) MG PO TABS: 325.0000 mg | ORAL_TABLET | Freq: Two times a day (BID) | ORAL | 0 refills | Status: DC

## 2017-11-20 MED ORDER — FERROUS SULFATE 325 (65 FE) MG PO TABS: 325.0000 mg | ORAL_TABLET | Freq: Two times a day (BID) | ORAL | Status: DC

## 2017-11-20 MED ORDER — POLYETHYLENE GLYCOL 3350 17 G PO PACK: 17.0000 g | PACK | Freq: Every day | ORAL | 0 refills | Status: AC

## 2017-11-20 NOTE — Interval H&P Note (Signed)
History and Physical Interval Note:  11/20/2017 7:01 AM  Maurice Perkins  has presented today for surgery, with the diagnosis of hematochezia and adenomatous polyps in 2015  The various methods of treatment have been discussed with the patient and family. After consideration of risks, benefits and other options for treatment, the patient has consented to  Procedure(s): COLONOSCOPY (N/A) as a surgical intervention .  The patient's history has been reviewed, patient examined, no change in status, stable for surgery.  I have reviewed the patient's chart and labs.  Questions were answered to the patient's satisfaction.     Milus Banister

## 2017-11-20 NOTE — Op Note (Signed)
[facility] Patient Name: Maurice Perkins Procedure Date : 11/20/2017 MRN: [id number] Attending MD: Milus Banister , MD Date of Birth: 1946/08/05 CSN: [id number] Age: 71 Admit Type: Inpatient Procedure:                Colonoscopy Indications:              Hematochezia; had 6 TAs removed 2015 Dr. Deatra Ina Providers:                Milus Banister, MD, Kingsley Plan, RN,                            William Dalton, Technician Referring MD:              Medicines:                Monitored Anesthesia Care Complications:            No immediate complications. Estimated blood loss:                            None. Estimated Blood Loss:     Estimated blood loss: none. Procedure:                Pre-Anesthesia Assessment:                           - Prior to the procedure, a History and Physical                            was performed, and patient medications and                            allergies were reviewed. The patient's tolerance of                            previous anesthesia was also reviewed. The risks                            and benefits of the procedure and the sedation                            options and risks were discussed with the patient.                            All questions were answered, and informed consent                            was obtained. Prior Anticoagulants: The patient has                            taken no previous anticoagulant or antiplatelet                            agents. ASA Grade Assessment: II - A patient with  mild systemic disease. After reviewing the risks                            and benefits, the patient was deemed in                            satisfactory condition to undergo the procedure.                           After obtaining informed consent, the colonoscope                            was passed under direct vision. Throughout the                            procedure, the  patient's blood pressure, pulse, and                            oxygen saturations were monitored continuously. The                            EC-[id number] ([id number]) scope was introduced through                            the anus and advanced to the the cecum, identified                            by appendiceal orifice and ileocecal valve. The                            colonoscopy was performed without difficulty. The                            patient tolerated the procedure well. The quality                            of the bowel preparation was good. The ileocecal                            valve, appendiceal orifice, and rectum were                            photographed. Scope In: 7:40:51 AM Scope Out: 7:54:09 AM Scope Withdrawal Time: 0 hours 11 minutes 23 seconds  Total Procedure Duration: 0 hours 13 minutes 18 seconds  Findings:      There was no blood in the colon.      Five sessile polyps were found in the rectum, descending colon,       transverse colon, ascending colon and cecum. The polyps were 2 to 4 mm       in size. These polyps were removed with a cold snare. Resection and       retrieval were complete.      Multiple small and large-mouthed diverticula were found in the entire       colon.  The exam was otherwise without abnormality on direct and retroflexion       views. Impression:               - Five 2 to 4 mm polyps in the rectum, in the                            descending colon, in the transverse colon, in the                            ascending colon and in the cecum, removed with a                            cold snare. Resected and retrieved.                           - Diverticulosis in the entire examined colon. This                            is the likely source of your recent bleeding which                            has clearly stopped.                           - The examination was otherwise normal on direct                            and  retroflexion views. Moderate Sedation:      none Recommendation:           - Return for hospital floor, OK for discharge today                            from GI standpoint.                           - Resume previous diet.                           - Continue present medications.                           - Await pathology results.                           If the polyp(s) is proven to be 'pre-cancerous' on                            pathology, you will need repeat colonoscopy in 3-5                            years. Procedure Code(s):        --- Professional ---                           [phone], Colonoscopy, flexible; with  removal of                            tumor(s), polyp(s), or other lesion(s) by snare                            technique Diagnosis Code(s):        --- Professional ---                           K62.1, Rectal polyp                           D12.4, Benign neoplasm of descending colon                           D12.3, Benign neoplasm of transverse colon (hepatic                            flexure or splenic flexure)                           D12.2, Benign neoplasm of ascending colon                           D12.0, Benign neoplasm of cecum                           K92.1, Melena (includes Hematochezia)                           K57.30, Diverticulosis of large intestine without                            perforation or abscess without bleeding CPT copyright 2016 American Medical Association. All rights reserved. The codes documented in this report are preliminary and upon coder review may  be revised to meet current compliance requirements. Milus Banister, MD 11/20/2017 7:59:15 AM This report has been signed electronically. Number of Addenda: 0

## 2017-11-20 NOTE — Progress Notes (Signed)
Nsg Discharge Note  Admit Date:  11/18/2017 Discharge date: 11/20/2017   EHSAN CORVIN to be D/C'd Home per MD order.  AVS completed.  Copy for chart, and copy for patient signed, and dated. Patient/caregiver able to verbalize understanding.  Discharge Medication: Allergies as of 11/20/2017      Reactions   Penicillins Hives   Has patient had a PCN reaction causing immediate rash, facial/tongue/throat swelling, SOB or lightheadedness with hypotension:Yes Has patient had a PCN reaction causing severe rash involving mucus membranes or skin necrosis:unsure Has patient had a PCN reaction that required hospitalization:no Has patient had a PCN reaction occurring within the last 10 years:no If all of the above answers are "NO", then may proceed with Cephalosporin use.   Hydrocodone Nausea Only      Medication List    STOP taking these medications   methocarbamol 750 MG tablet Commonly known as:  ROBAXIN   oxyCODONE 5 MG immediate release tablet Commonly known as:  Oxy IR/ROXICODONE     TAKE these medications   ammonium lactate 12 % lotion Commonly known as:  LAC-HYDRIN Apply 1 application topically daily. Applied after patient has bathe   aspirin EC 81 MG tablet Take 81 mg by mouth daily.   atenolol 50 MG tablet Commonly known as:  TENORMIN Take 25 mg by mouth every morning.   atorvastatin 40 MG tablet Commonly known as:  LIPITOR Take 40 mg by mouth daily.   fenofibrate 160 MG tablet Take 160 mg by mouth daily.   ferrous sulfate 325 (65 FE) MG tablet Take 1 tablet (325 mg total) by mouth 2 (two) times daily with a meal.   glimepiride 4 MG tablet Commonly known as:  AMARYL Take 4 mg by mouth 2 (two) times daily.   GLUCOSAMINE CHONDROITIN MSM PO Take 2 tablets by mouth daily.   insulin aspart 100 UNIT/ML injection Commonly known as:  novoLOG Inject 12 Units into the skin daily.   Insulin Glargine 100 UNIT/ML Solostar Pen Commonly known as:  LANTUS Inject 60  Units into the skin every morning.   losartan-hydrochlorothiazide 50-12.5 MG tablet Commonly known as:  HYZAAR Take 1 tablet by mouth every morning.   metFORMIN 500 MG tablet Commonly known as:  GLUCOPHAGE Take 500-1,000 mg by mouth See admin instructions. 1000 mg at noon and 500 mg in the evening   multivitamin with minerals Tabs tablet Take 1 tablet by mouth daily.   polyethylene glycol packet Commonly known as:  MIRALAX / GLYCOLAX Take 17 g by mouth daily. Start taking on:  11/21/2017   PROBIOTIC PO Take 1 capsule by mouth daily.   tetrahydrozoline 0.05 % ophthalmic solution Place 1-3 drops into both eyes 3 (three) times daily as needed (for itchy/scratchy eyes).       Discharge Assessment: Vitals:   11/20/17 0715 11/20/17 0802  BP: (!) 148/63 (!) 108/54  Pulse:    Resp: (!) 21 16  Temp:    SpO2: 97% 95%   Skin clean, dry and intact without evidence of skin break down, no evidence of skin tears noted. IV catheter discontinued intact. Site without signs and symptoms of complications - no redness or edema noted at insertion site, patient denies c/o pain - only slight tenderness at site.  Dressing with slight pressure applied.  D/c Instructions-Education: Discharge instructions given to patient/family with verbalized understanding. D/c education completed with patient/family including follow up instructions, medication list, d/c activities limitations if indicated, with other d/c instructions as indicated by MD -  patient able to verbalize understanding, all questions fully answered. Patient instructed to return to ED, call 911, or call MD for any changes in condition.  Patient escorted via [place], and D/C home via private auto.  Salley Slaughter, RN 11/20/2017 11:56 AM

## 2017-11-20 NOTE — Transfer of Care (Signed)
Immediate Anesthesia Transfer of Care Note  Patient: Maurice Perkins  Procedure(s) Performed: COLONOSCOPY (N/A )  Patient Location: PACU and Endoscopy Unit  Anesthesia Type:MAC  Level of Consciousness: awake, alert , oriented and patient cooperative  Airway & Oxygen Therapy: Patient Spontanous Breathing  Post-op Assessment: Report given to RN, Post -op Vital signs reviewed and stable, Patient moving all extremities and Patient moving all extremities X 4  Post vital signs: Reviewed and stable  Last Vitals:  Vitals:   11/20/17 0715 11/20/17 0802  BP: (!) 148/63 (!) 108/54  Pulse:    Resp: (!) 21 16  Temp:    SpO2: 97% 95%    Last Pain:  Vitals:   11/20/17 0613  TempSrc: Oral  PainSc:          Complications: No apparent anesthesia complications

## 2017-11-20 NOTE — Anesthesia Preprocedure Evaluation (Signed)
Anesthesia Evaluation  Patient identified by MRN, date of birth, ID band Patient awake    Reviewed: Allergy & Precautions, NPO status , Patient's Chart, lab work & pertinent test results, reviewed documented beta blocker date and time   Airway Mallampati: II  TM Distance: >3 FB Neck ROM: Full    Dental   Pulmonary former smoker,    breath sounds clear to auscultation       Cardiovascular hypertension, Pt. on medications and Pt. on home beta blockers + CAD   Rhythm:Regular Rate:Normal     Neuro/Psych negative neurological ROS     GI/Hepatic Neg liver ROS, Rectal bleeding    Endo/Other  diabetes, Type 2, Insulin Dependent, Oral Hypoglycemic Agents  Renal/GU CRFRenal disease     Musculoskeletal   Abdominal   Peds  Hematology  (+) anemia ,   Anesthesia Other Findings   Reproductive/Obstetrics                             Anesthesia Physical Anesthesia Plan  ASA: III  Anesthesia Plan: MAC   Post-op Pain Management:    Induction: Intravenous  PONV Risk Score and Plan: 1 and Ondansetron, Propofol infusion and Treatment may vary due to age or medical condition  Airway Management Planned: Natural Airway and Simple Face Mask  Additional Equipment:   Intra-op Plan:   Post-operative Plan:   Informed Consent: I have reviewed the patients History and Physical, chart, labs and discussed the procedure including the risks, benefits and alternatives for the proposed anesthesia with the patient or authorized representative who has indicated his/her understanding and acceptance.     Plan Discussed with: CRNA  Anesthesia Plan Comments:         Anesthesia Quick Evaluation

## 2017-11-20 NOTE — Anesthesia Procedure Notes (Signed)
Procedure Name: MAC Date/Time: 11/20/2017 7:40 AM Performed by: Izora Gala, CRNA Pre-anesthesia Checklist: Patient identified, Emergency Drugs available, Suction available and Patient being monitored Patient Re-evaluated:Patient Re-evaluated prior to induction Oxygen Delivery Method: Simple face mask Preoxygenation: Pre-oxygenation with 100% oxygen Induction Type: IV induction Placement Confirmation: positive ETCO2

## 2017-11-20 NOTE — Discharge Summary (Signed)
Physician Discharge Summary  NAVEN GIAMBALVO FFM:[id number] DOB: 22-Nov-1946 DOA: 11/18/2017  PCP: Levin Erp, MD  Admit date: 11/18/2017 Discharge date: 11/20/2017  Admitted From: Home Disposition:  Home  Recommendations for Outpatient Follow-up:  1. Follow up with PCP in 1- week. 2. Patient has been placed on iron supplements, check iron panel in 3 to 4 weeks. 3. Follow-up pathology report, on polyps which were removed during colonoscopy.   Home Health: No   Equipment/Devices: No   Discharge Condition: Home CODE STATUS: Full  Diet recommendation:  Heart healthy and diabetic prudent.   Brief/Interim Summary: 71 year old male who presents with rectal bleeding. He does have a significant past medical history for hypertension, dyslipidemia, type 2 diabetes mellitus and a pancreatic mass. Patient reported hematochezia, mild to moderate which was persistent for the last 3 days prior to hospitalization. He called his primary care physician and he was advised to come to the hospital for further evaluation. On initial physical examination blood pressure 107/67, heart rate 77, temperature 97.9, respiratory rate 16, oxygenation 100%. Moist mucous membranes, lungs clear to auscultation bilaterally, no wheezes rales or rhonchi, heart S1-S2 present rhythmic, abdomen was soft, nontender, no lower extremity edema. Sodium 140, potassium 4.7, chloride 98, bicarbonate 28, glucose 282, BUN 8, creatinine 2.16 white count 8.9, hemoglobin 12.3, hematocrit 38.0, platelets 408,  Iron 67, TIBC 412, transferrin saturation 16, ferritin of 102. INR 1.0. Fecal occult blood positive.   Patient was admitted to the hospital working diagnosis of lower GI bleed.  1. Acute lower GI bleed due to diverticulosis. Patient was admitted to the medical unit, he was placed on a remote telemetry monitor, his hemoglobin and hematocrit remained stable. No need for PRBC transfusion. He was seen by the gastroenterology service, received  bowel preparation, and underwent colonoscopy. Patient was found to have polyps in the rectum, descending, transverse and descending colon as well as in the cecum, which were removed. Positive diverticulosis with no signs of active bleeding.  2. Combined anemia chronic disease and iron deficiency. Patient's iron panel showed mild iron deficiency with a serum iron 67, TIBC 412, transferring saturation 16 % and ferritin of 102, patient has been placed on iron supplements for the next 4 weeks, recommend follow-up on iron panel in 3 to 4 weeks after discharge. Discharge hb 11,7 with Hct 34,7.   3. Acute kidney injury on chronic kidney disease stage III. Patient received IV fluids with improvement of kidney function, his peak creatinine was 2.16, his discharge creatinine was 1.71 with a serum potassium 3.7 and serum bicarbonate of 25. Avoid nephrotoxic agents, follow-up as an outpatient.  4. Type 2 diabetes mellitus. Patient was placed on a clear liquid diet for endoscopic procedure, he was placed on insulin sliding scale for glucose coverage and monitoring, his capillary glucose remained well-controlled, 147, 199, 125, 146, 137. Patient will resume metformin and glimepiride, he is also on insulin sliding-scale, and 60 units of glargine.   5. HTN. Patient continue atenolol with good toleration. At discharge will resume losartan and hctz. Continue aspirin for cardiovascular risk reduction.   6. Dyslipidemia. Continue atorvastatin and fenofibrate.   Discharge Diagnoses:  Principal Problem:   Rectal bleeding Active Problems:   Anemia   Hyperglycemia   ARF (acute renal failure) (HCC)   Benign neoplasm of cecum   Diverticulosis of colon without hemorrhage    Discharge Instructions   Allergies as of 11/20/2017      Reactions   Penicillins Hives   Has patient had  a PCN reaction causing immediate rash, facial/tongue/throat swelling, SOB or lightheadedness with hypotension:Yes Has patient had a PCN  reaction causing severe rash involving mucus membranes or skin necrosis:unsure Has patient had a PCN reaction that required hospitalization:no Has patient had a PCN reaction occurring within the last 10 years:no If all of the above answers are "NO", then may proceed with Cephalosporin use.   Hydrocodone Nausea Only      Medication List    STOP taking these medications   methocarbamol 750 MG tablet Commonly known as:  ROBAXIN   oxyCODONE 5 MG immediate release tablet Commonly known as:  Oxy IR/ROXICODONE     TAKE these medications   ammonium lactate 12 % lotion Commonly known as:  LAC-HYDRIN Apply 1 application topically daily. Applied after patient has bathe   aspirin EC 81 MG tablet Take 81 mg by mouth daily.   atenolol 50 MG tablet Commonly known as:  TENORMIN Take 25 mg by mouth every morning.   atorvastatin 40 MG tablet Commonly known as:  LIPITOR Take 40 mg by mouth daily.   fenofibrate 160 MG tablet Take 160 mg by mouth daily.   ferrous sulfate 325 (65 FE) MG tablet Take 1 tablet (325 mg total) by mouth 2 (two) times daily with a meal.   glimepiride 4 MG tablet Commonly known as:  AMARYL Take 4 mg by mouth 2 (two) times daily.   GLUCOSAMINE CHONDROITIN MSM PO Take 2 tablets by mouth daily.   insulin aspart 100 UNIT/ML injection Commonly known as:  novoLOG Inject 12 Units into the skin daily.   Insulin Glargine 100 UNIT/ML Solostar Pen Commonly known as:  LANTUS Inject 60 Units into the skin every morning.   losartan-hydrochlorothiazide 50-12.5 MG tablet Commonly known as:  HYZAAR Take 1 tablet by mouth every morning.   metFORMIN 500 MG tablet Commonly known as:  GLUCOPHAGE Take 500-1,000 mg by mouth See admin instructions. 1000 mg at noon and 500 mg in the evening   multivitamin with minerals Tabs tablet Take 1 tablet by mouth daily.   polyethylene glycol packet Commonly known as:  MIRALAX / GLYCOLAX Take 17 g by mouth daily. Start taking on:   11/21/2017   PROBIOTIC PO Take 1 capsule by mouth daily.   tetrahydrozoline 0.05 % ophthalmic solution Place 1-3 drops into both eyes 3 (three) times daily as needed (for itchy/scratchy eyes).       Allergies  Allergen Reactions  . Penicillins Hives    Has patient had a PCN reaction causing immediate rash, facial/tongue/throat swelling, SOB or lightheadedness with hypotension:Yes Has patient had a PCN reaction causing severe rash involving mucus membranes or skin necrosis:unsure Has patient had a PCN reaction that required hospitalization:no Has patient had a PCN reaction occurring within the last 10 years:no If all of the above answers are "NO", then may proceed with Cephalosporin use.   Marland Kitchen Hydrocodone Nausea Only    Consultations:  Gastroenterology    Procedures/Studies: US Renal  Result Date: 11/18/2017 CLINICAL DATA:  71 year old male with acute renal failure. EXAM: RENAL / URINARY TRACT ULTRASOUND COMPLETE COMPARISON:  Abdominal CT dated 03/16/2016 and MRI dated 08/19/2016 FINDINGS: Right Kidney: Length: 11.1 cm. Normal echogenicity. Mild parenchymal atrophy. There is no hydronephrosis or echogenic stone. A 3 cm midpole cyst noted. Left Kidney: Length: 11.2 cm. Normal echogenicity. Mild parenchymal atrophy. No hydronephrosis or echogenic stone. A 1.5 cm inferior pole cyst. Bladder: Appears normal for degree of bladder distention. The prostate gland measures 4.4 cm in  greatest dimension with a total volume of 32 cc. There is diffuse fatty infiltration of the liver. IMPRESSION: 1. No hydronephrosis or echogenic stone. 2. Fatty liver. Electronically Signed   By: Anner Crete M.D.   On: 11/18/2017 21:41       Subjective: Patient feeling well, no nausea or vomiting, no chest pain or dyspnea. No abdominal pain, no hematochezia or melena.   Discharge Exam: Vitals:   11/20/17 0715 11/20/17 0802  BP: (!) 148/63 (!) 108/54  Pulse:    Resp: (!) 21 16  Temp:    SpO2: 97% 95%    Vitals:   11/19/17 2228 11/20/17 0613 11/20/17 0715 11/20/17 0802  BP: 129/62 132/68 (!) 148/63 (!) 108/54  Pulse: 60 66    Resp: 17 17 (!) 21 16  Temp: 98.1 F (36.7 C) 98 F (36.7 C)    TempSrc: Oral Oral    SpO2: 99% 98% 97% 95%  Weight:   94.8 kg (209 lb)   Height:   6\' 3"  (1.905 m)     General: Pt is alert, awake, not in acute distress E ENT: no pallor or icterus Cardiovascular: RRR, S1/S2 +, no rubs, no gallops Respiratory: CTA bilaterally, no wheezing, no rhonchi Abdominal: Soft, NT, ND, bowel sounds + Extremities: no edema, no cyanosis    The results of significant diagnostics from this hospitalization (including imaging, microbiology, ancillary and laboratory) are listed below for reference.     Microbiology: No results found for this or any previous visit (from the past 240 hour(s)).   Labs: BNP (last 3 results) No results for input(s): BNP in the last 8760 hours. Basic Metabolic Panel: Recent Labs  Lab 11/18/17 1635 11/19/17 0252 11/20/17 0558  NA 140 140 139  K 4.7 3.3* 3.7  CL 99* 100* 103  CO2 28 29 25   GLUCOSE 282* 110* 150*  BUN 38* 33* 20  CREATININE 2.16* 1.95* 1.71*  CALCIUM 9.5 8.9 8.5*   Liver Function Tests: Recent Labs  Lab 11/18/17 1635 11/19/17 0252  AST 32 25  ALT 26 22  ALKPHOS 43 35*  BILITOT 0.3 0.6  PROT 6.6 6.2*  ALBUMIN 4.0 3.5   No results for input(s): LIPASE, AMYLASE in the last 168 hours. No results for input(s): AMMONIA in the last 168 hours. CBC: Recent Labs  Lab 11/18/17 1635 11/19/17 0252 11/19/17 1319 11/20/17 0558  WBC 8.9 7.2 7.0 8.3  NEUTROABS  --   --   --  5.7  HGB 12.3* 11.4* 10.8* 11.7*  HCT 38.0* 35.1* 33.3* 34.7*  MCV 89.6 88.9 89.8 87.4  PLT 408* 349 324 299   Cardiac Enzymes: No results for input(s): CKTOTAL, CKMB, CKMBINDEX, TROPONINI in the last 168 hours. BNP: Invalid input(s): POCBNP CBG: Recent Labs  Lab 11/19/17 1633 11/19/17 2022 11/20/17 0044 11/20/17 0504  11/20/17 0824  GLUCAP 147* 199* 125* 146* 137*   D-Dimer No results for input(s): DDIMER in the last 72 hours. Hgb A1c No results for input(s): HGBA1C in the last 72 hours. Lipid Profile No results for input(s): CHOL, HDL, LDLCALC, TRIG, CHOLHDL, LDLDIRECT in the last 72 hours. Thyroid function studies No results for input(s): TSH, T4TOTAL, T3FREE, THYROIDAB in the last 72 hours.  Invalid input(s): FREET3 Anemia work up Recent Labs    11/19/17 0252  VITAMINB12 211  FERRITIN 102  TIBC 412  IRON 67   Urinalysis    Component Value Date/Time   COLORURINE YELLOW 09/22/2016 [place] 09/22/2016 1052  LABSPEC 1.022 09/22/2016 1052   PHURINE 5.5 09/22/2016 [place] 09/22/2016 [place] 09/22/2016 [place] 09/22/2016 [place] 09/22/2016 1052   PROTEINUR NEGATIVE 09/22/2016 1052   UROBILINOGEN 0.2 09/25/2015 2328   NITRITE NEGATIVE 09/22/2016 1052   LEUKOCYTESUR NEGATIVE 09/22/2016 1052   Sepsis Labs Invalid input(s): PROCALCITONIN,  WBC,  LACTICIDVEN Microbiology No results found for this or any previous visit (from the past 240 hour(s)).   Time coordinating discharge: 45 minutes  SIGNED:   Tawni Millers, MD  [facility] 11/20/2017, 8:35 AM Pager [phone]  If 7PM-7AM, please contact night-coverage [email or web address] Password [email or web address]

## 2017-11-20 NOTE — Anesthesia Postprocedure Evaluation (Signed)
Anesthesia Post Note  Patient: Maurice Perkins  Procedure(s) Performed: COLONOSCOPY (N/A )     Patient location during evaluation: PACU Anesthesia Type: MAC Level of consciousness: awake and alert Pain management: pain level controlled Vital Signs Assessment: post-procedure vital signs reviewed and stable Respiratory status: spontaneous breathing, nonlabored ventilation, respiratory function stable and patient connected to nasal cannula oxygen Cardiovascular status: stable and blood pressure returned to baseline Postop Assessment: no apparent nausea or vomiting Anesthetic complications: no    Last Vitals:  Vitals:   11/20/17 0715 11/20/17 0802  BP: (!) 148/63 (!) 108/54  Pulse:    Resp: (!) 21 16  Temp:    SpO2: 97% 95%    Last Pain:  Vitals:   11/20/17 0905  TempSrc:   PainSc: 0-No pain                 Tiajuana Amass

## 2017-11-22 ENCOUNTER — Encounter (HOSPITAL_COMMUNITY): Payer: Self-pay | Admitting: Gastroenterology

## 2017-11-22 LAB — FOLATE RBC
FOLATE, HEMOLYSATE: 441.7 ng/mL
FOLATE, RBC: 1303 ng/mL (ref >498)
HEMATOCRIT: 33.9 % — AB (ref 37.5–51.0)

## 2019-05-19 IMAGING — US US RENAL
1 series · 14 of 25 positions shown · non-contrast
Comparison: Abdominal CT dated 03/16/2016 and MRI dated 08/19/2016

CLINICAL DATA: 71-year-old male with acute renal failure.

EXAM:
RENAL / URINARY TRACT ULTRASOUND COMPLETE

[Series 1: us renal · 0.26mm/px · 14 of 45 slices shown]
[im 1/45]
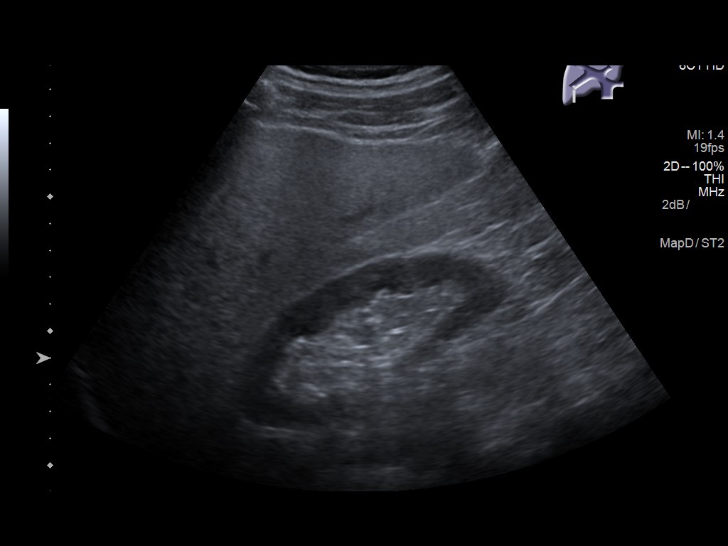
[im 4/45]
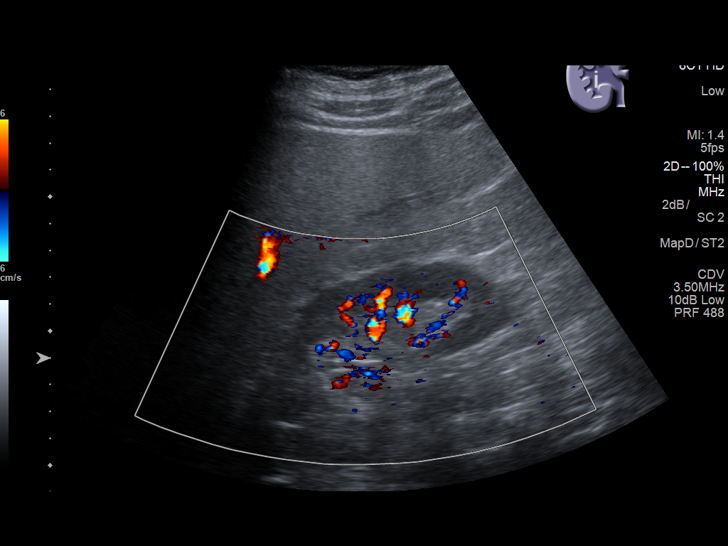
[im 8/45]
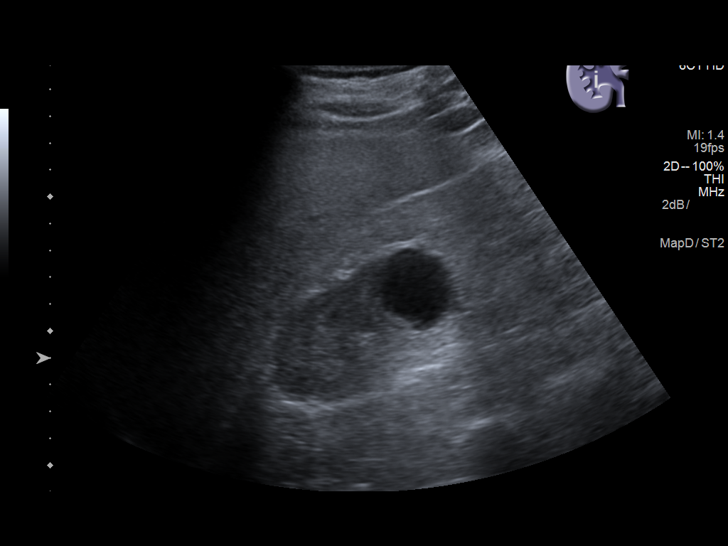
[im 12/45]
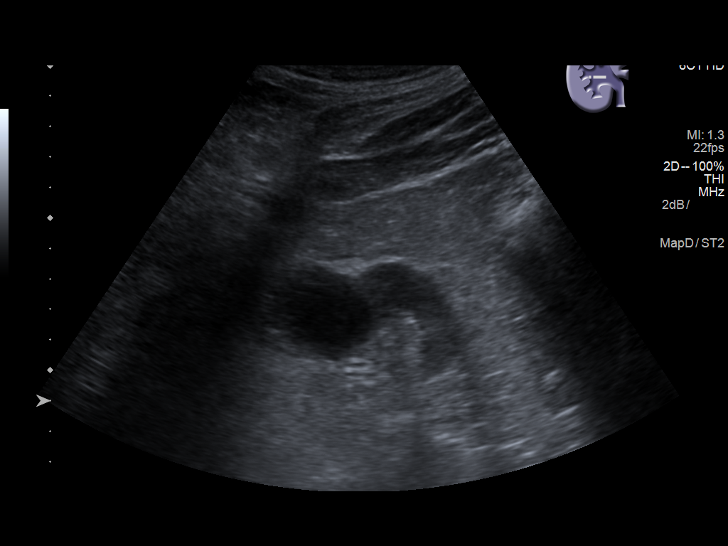
[im 15/45]
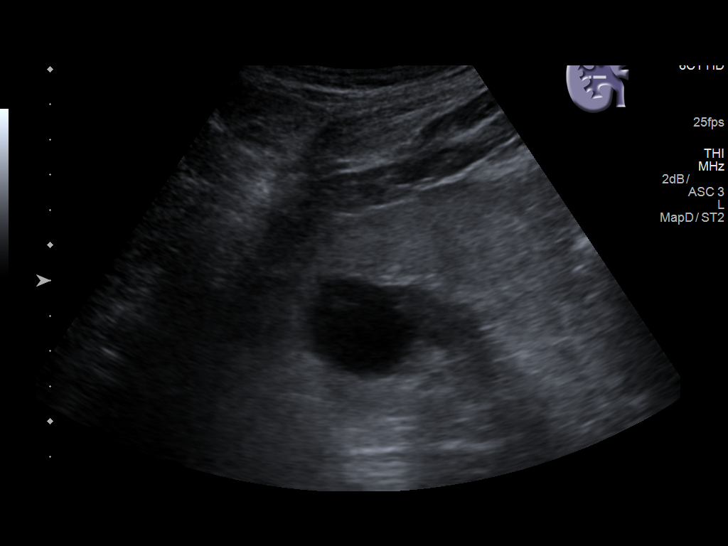
[im 17/45]
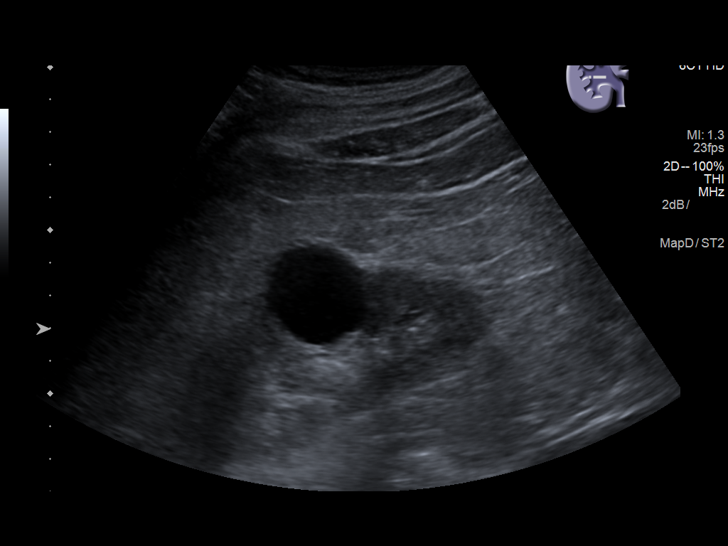
[im 21/45]
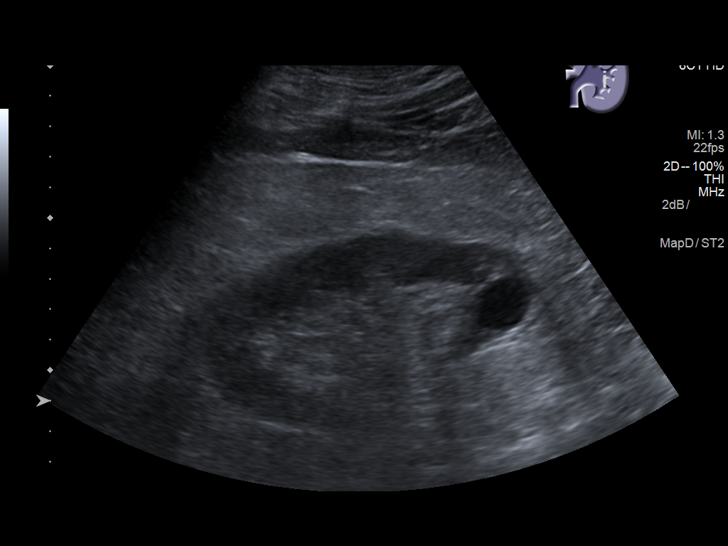
[im 24/45]
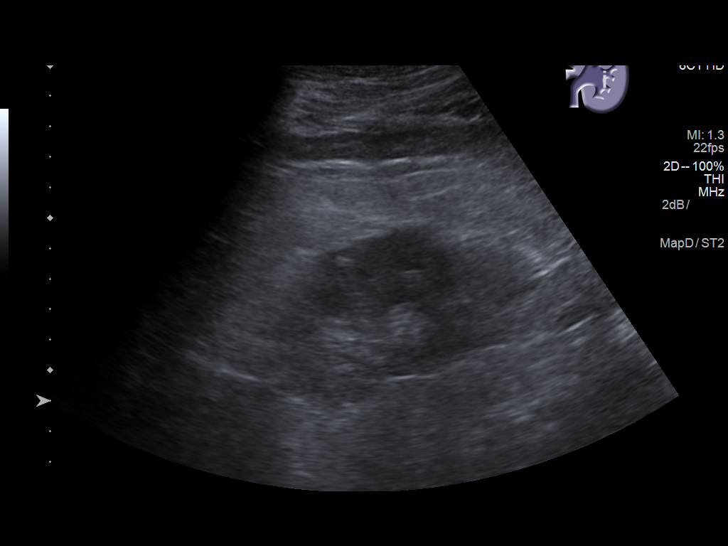
[im 28/45]
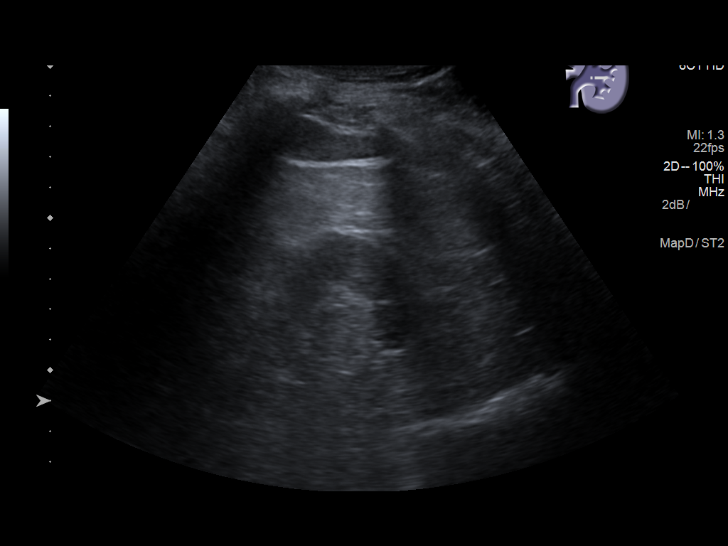
[im 30/45]
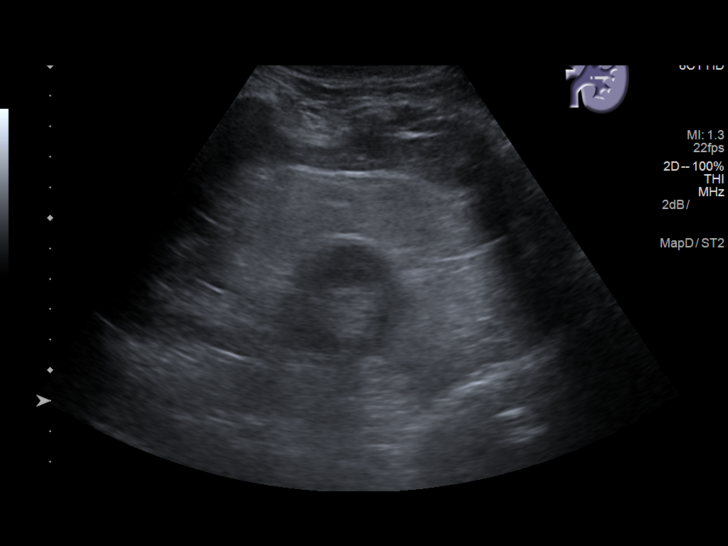
[im 34/45]
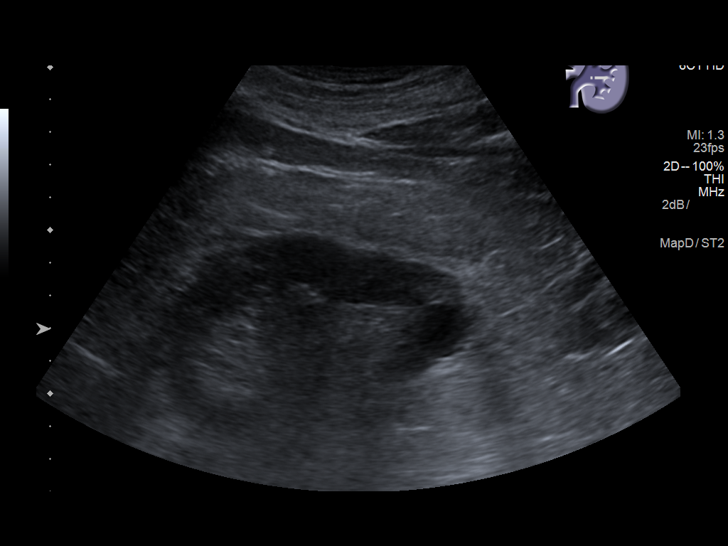
[im 37/45]
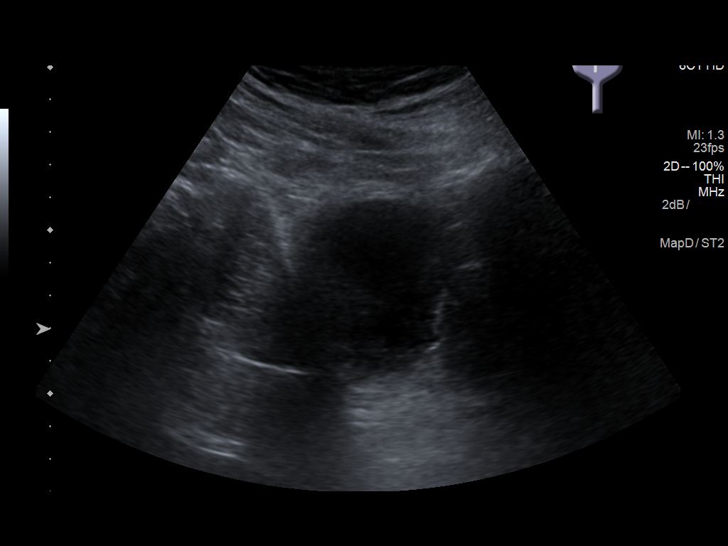
[im 41/45]
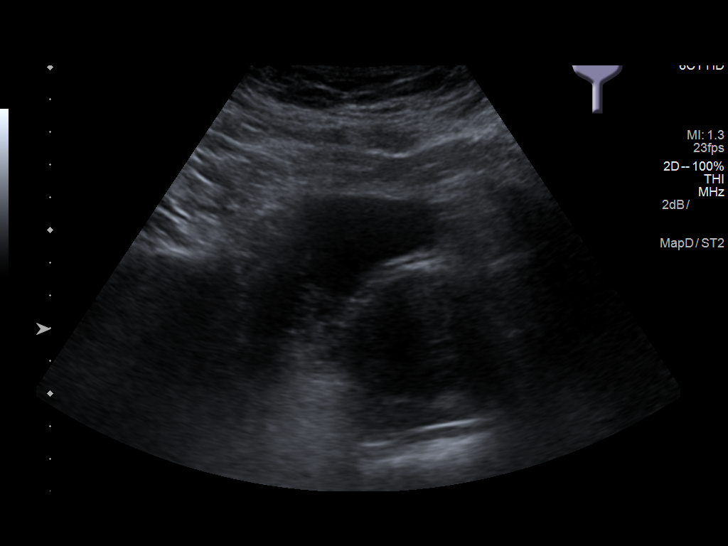
[im 45/45]
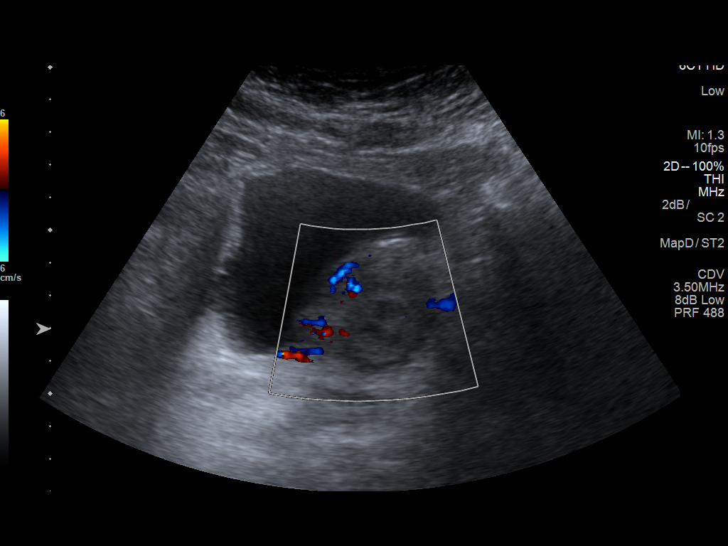

[14 of 25 positions shown; findings below may reference images not displayed]

FINDINGS: Right Kidney:

Length: 11.1 cm. Normal echogenicity. Mild parenchymal atrophy.
There is no hydronephrosis or echogenic stone. A 3 cm midpole cyst
noted.

Left Kidney:

Length: 11.2 cm. Normal echogenicity. Mild parenchymal atrophy. No
hydronephrosis or echogenic stone. A 1.5 cm inferior pole cyst.

Bladder:

Appears normal for degree of bladder distention.

The prostate gland measures 4.4 cm in greatest dimension with a
total volume of 32 cc.

There is diffuse fatty infiltration of the liver.
IMPRESSION: 1. No hydronephrosis or echogenic stone.
2. Fatty liver.

## 2019-12-28 ENCOUNTER — Ambulatory Visit: Payer: Medicare Other | Attending: Internal Medicine

## 2019-12-28 DIAGNOSIS — Z23 Encounter for immunization: Secondary | ICD-10-CM | POA: Insufficient documentation

## 2019-12-28 NOTE — Progress Notes (Signed)
   Covid-19 Vaccination Clinic  Name:  Maurice Perkins    MRN: [id number] DOB: 01/14/1946  12/28/2019  [name] was observed post Covid-19 immunization for 15 minutes without incidence. He was provided with Vaccine Information Sheet and instruction to access the V-Safe system.   [name] was instructed to call 911 with any severe reactions post vaccine: Marland Kitchen Difficulty breathing  . Swelling of your face and throat  . A fast heartbeat  . A bad rash all over your body  . Dizziness and weakness    Immunizations Administered    Name Date Dose VIS Date Route   Pfizer COVID-19 Vaccine 12/28/2019 12:00 PM 0.3 mL 11/24/2019 Intramuscular   Manufacturer: [place]   Lot: [id number]   [place]: [id number]

## 2020-01-16 ENCOUNTER — Ambulatory Visit: Payer: Medicare HMO | Attending: Internal Medicine

## 2020-01-16 DIAGNOSIS — Z23 Encounter for immunization: Secondary | ICD-10-CM

## 2020-01-16 NOTE — Progress Notes (Signed)
   Covid-19 Vaccination Clinic  Name:  Maurice Perkins    MRN: [id number] DOB: [birth year]  01/16/2020  [name] was observed post Covid-19 immunization for 15 minutes without incidence. He was provided with Vaccine Information Sheet and instruction to access the V-Safe system.   [name] was instructed to call 911 with any severe reactions post vaccine: Marland Kitchen Difficulty breathing  . Swelling of your face and throat  . A fast heartbeat  . A bad rash all over your body  . Dizziness and weakness    Immunizations Administered    Name Date Dose VIS Date Route   Pfizer COVID-19 Vaccine 01/16/2020 10:03 AM 0.3 mL 11/24/2019 Intramuscular   Manufacturer: [place]   Lot: [id number]   [place]: [id number]

## 2020-03-19 ENCOUNTER — Telehealth (INDEPENDENT_AMBULATORY_CARE_PROVIDER_SITE_OTHER): Payer: Self-pay

## 2020-03-19 NOTE — Telephone Encounter (Signed)
Patient called and asked if he could fly on an airplane and wanted to know when he could take his green wristband off.   After consulting with Dr. Zadie Rhine, I let the patient know that he could fly and he was able to take his wristband off now.

## 2020-03-20 NOTE — Telephone Encounter (Signed)
Brain Hilts, MD, have reviewed all documentation for this visit. The documentation on 03/20/20 for the exam, diagnosis, procedures, and orders are all accurate and complete.

## 2020-04-18 ENCOUNTER — Encounter (INDEPENDENT_AMBULATORY_CARE_PROVIDER_SITE_OTHER): Payer: Self-pay | Admitting: Ophthalmology

## 2020-04-18 ENCOUNTER — Other Ambulatory Visit: Payer: Self-pay

## 2020-04-18 ENCOUNTER — Ambulatory Visit (INDEPENDENT_AMBULATORY_CARE_PROVIDER_SITE_OTHER): Payer: Medicare HMO | Admitting: Ophthalmology

## 2020-04-18 DIAGNOSIS — H43811 Vitreous degeneration, right eye: Secondary | ICD-10-CM | POA: Insufficient documentation

## 2020-04-18 DIAGNOSIS — Z09 Encounter for follow-up examination after completed treatment for conditions other than malignant neoplasm: Secondary | ICD-10-CM | POA: Insufficient documentation

## 2020-04-18 DIAGNOSIS — H35342 Macular cyst, hole, or pseudohole, left eye: Secondary | ICD-10-CM | POA: Diagnosis not present

## 2020-04-18 NOTE — Progress Notes (Signed)
04/18/2020     CHIEF COMPLAINT Patient presents for Retina Follow Up   HISTORY OF PRESENT ILLNESS: Maurice Perkins is a 74 y.o. male who presents to the clinic today for:   HPI    Retina Follow Up    Patient presents with  Other.  In left eye.  Duration of 8 weeks.  Since onset it is stable.          Comments    8 week follow up- OCT OU, PO OS Patient denies change in vision and overall has no complaints.        Last edited by Gerda Diss on 04/18/2020  8:07 AM. (History)      Referring physician: Levin Erp, MD [address] [place] [address] [place],  [place] [address]  HISTORICAL INFORMATION:   Selected notes from the MEDICAL RECORD NUMBER    Lab Results  Component Value Date   HGBA1C 8.3 (H) 09/22/2016     CURRENT MEDICATIONS: Current Outpatient Medications (Ophthalmic Drugs)  Medication Sig  . tetrahydrozoline 0.05 % ophthalmic solution Place 1-3 drops into both eyes 3 (three) times daily as needed (for itchy/scratchy eyes).   No current facility-administered medications for this visit. (Ophthalmic Drugs)   Current Outpatient Medications (Other)  Medication Sig  . ammonium lactate (LAC-HYDRIN) 12 % lotion Apply 1 application topically daily. Applied after patient has bathe  . aspirin EC 81 MG tablet Take 81 mg by mouth daily.  Marland Kitchen atenolol (TENORMIN) 50 MG tablet Take 25 mg by mouth every morning.   Marland Kitchen atorvastatin (LIPITOR) 40 MG tablet Take 40 mg by mouth daily.  . fenofibrate 160 MG tablet Take 160 mg by mouth daily.  . ferrous sulfate 325 (65 FE) MG tablet Take 1 tablet (325 mg total) by mouth 2 (two) times daily with a meal.  . glimepiride (AMARYL) 4 MG tablet Take 4 mg by mouth 2 (two) times daily.  . insulin aspart (NOVOLOG) 100 UNIT/ML injection Inject 12 Units into the skin daily.   . Insulin Glargine (LANTUS) 100 UNIT/ML Solostar Pen Inject 60 Units into the skin every morning.   Marland Kitchen losartan-hydrochlorothiazide (HYZAAR) 50-12.5 MG per tablet Take 1  tablet by mouth every morning.   . metFORMIN (GLUCOPHAGE) 500 MG tablet Take 500-1,000 mg by mouth See admin instructions. 1000 mg at noon and 500 mg in the evening  . Misc Natural Products (GLUCOSAMINE CHONDROITIN MSM PO) Take 2 tablets by mouth daily.  . Multiple Vitamin (MULTIVITAMIN WITH MINERALS) TABS tablet Take 1 tablet by mouth daily.  . Probiotic Product (PROBIOTIC PO) Take 1 capsule by mouth daily.   No current facility-administered medications for this visit. (Other)      REVIEW OF SYSTEMS:    ALLERGIES Allergies  Allergen Reactions  . Penicillins Hives    Has patient had a PCN reaction causing immediate rash, facial/tongue/throat swelling, SOB or lightheadedness with hypotension:Yes Has patient had a PCN reaction causing severe rash involving mucus membranes or skin necrosis:unsure Has patient had a PCN reaction that required hospitalization:no Has patient had a PCN reaction occurring within the last 10 years:no If all of the above answers are "NO", then may proceed with Cephalosporin use.   Marland Kitchen Hydrocodone Nausea Only    PAST MEDICAL HISTORY Past Medical History:  Diagnosis Date  . Diabetes ([place])    insulin requiring type 2.   . Dry skin    pt has skin oil deficiency ischtiosis  . Hyperlipidemia   . Hypertension   .  Other general symptoms(780.99)   . Pancreatic mass 2015   cystic mucinous neoplasm. opted for non-surgical observation. follwed with serial MRI/MRCP.     Past Surgical History:  Procedure Laterality Date  . ABDOMINAL EXPLORATION SURGERY  09/25/2015  . COLONOSCOPY     2004 dr Earlean Shawl  . COLONOSCOPY N/A 11/20/2017   Procedure: COLONOSCOPY;  Surgeon: Milus Banister, MD;  [facility]: [facility] END[facility]COPY;  Service: Endoscopy;  Laterality: N/A;  . EUS N/A 05/17/2014   Procedure: UPPER ENDOSCOPIC ULTRASOUND (EUS) LINEAR;  Surgeon: Milus Banister, MD;  Location: [facility] ENDOSCOPY;  Service: Endoscopy;  Laterality: N/A;  . INSERTION OF MESH N/A 09/30/2016    Procedure: INSERTION OF MESH;  Surgeon: Stark Klein, MD;  Location: [place];  Service: General;  Laterality: N/A;  . LAPAROTOMY N/A 09/25/2015   Procedure: EXPLORATORY LAPAROTOMY small bowel resection;  Surgeon: Stark Klein, MD;  Location: [place];  Service: General;  Laterality: N/A;  . NASAL SINUS SURGERY  1970's  . pilonodal cyst     in high school-bottom of spine  . SHOULDER SURGERY Right 2000  . VENTRAL HERNIA REPAIR  09/30/2016   LAPROSCOPIC  . VENTRAL HERNIA REPAIR N/A 09/30/2016   Procedure: LAPAROSCOPIC VENTRAL HERNIA;  Surgeon: Stark Klein, MD;  Location: [facility] OR;  Service: General;  Laterality: N/A;    FAMILY HISTORY Family History  Problem Relation Age of Onset  . Heart disease Mother   . Breast cancer Mother        breast  . Colon cancer Neg Hx   . Stomach cancer Neg Hx   . Ulcerative colitis Neg Hx     SOCIAL HISTORY Social History   Tobacco Use  . Smoking status: Former Smoker    Packs/day: 2.00    Years: 15.00    Pack years: 30.00    Types: Cigarettes    Quit date: 12/15/1971    Years since quitting: 48.3  . Smokeless tobacco: Never Used  Substance Use Topics  . Alcohol use: No    Alcohol/week: 0.0 standard drinks  . Drug use: No         OPHTHALMIC EXAM:  Base Eye Exam    Visual Acuity (Snellen - Linear)      Right Left   Dist [place] 20/30 20/70+2   Dist ph [place] 20/25 NI       Tonometry (Tonopen, 8:12 AM)      Right Left   Pressure 12 10       Pupils      Pupils   Right PERRL   Left PERRL       Visual Fields (Counting fingers)      Left Right    Full Full       Extraocular Movement      Right Left    Full Full       Neuro/Psych    Oriented x3: Yes   Mood/Affect: Normal       Dilation    Left eye: 1.0% Mydriacyl, 2.5% Phenylephrine @ 8:12 AM        Slit Lamp and Fundus Exam    External Exam      Right Left   External Normal Normal       Slit Lamp Exam      Right Left   Lids/Lashes Normal Normal   Conjunctiva/Sclera  White and quiet White and quiet   Cornea Clear Clear   Anterior Chamber Deep and quiet Deep and quiet   Iris Round and reactive  Round and reactive   Lens  Posterior chamber intraocular lens   Anterior Vitreous Normal Normal       Fundus Exam      Right Left   Posterior Vitreous  Clear and vitrectomized   Disc  Normal   C/D Ratio  0.4   Macula  Hole closed, normal anatomy, negative Watzke   Vessels  Normal   Periphery  Normal          IMAGING AND PROCEDURES  Imaging and Procedures for 04/18/20  OCT, Retina - OU - Both Eyes       Right Eye Quality was good. Scan locations included subfoveal. Central Foveal Thickness: 258. Progression has been stable. Findings include normal observations.   Left Eye Quality was good. Scan locations included subfoveal. Central Foveal Thickness: 286. Progression has improved. Findings include normal observations.   Notes OD with posterior vitreous detachment  OS, with improved anatomy of the outer retina with restoration of IS-OS                ASSESSMENT/PLAN:  Posterior vitreous detachment of right eye   The nature of posterior vitreous detachment was discussed with the patient as well as its physiology, its age prevalence, and its possible implication regarding retinal breaks and detachment.  An informational brochure was given to the patient.  All the patient's questions were answered.  The patient was asked to return if new or different flashes or floaters develops.   Patient was instructed to contact office immediately if any changes were noticed. I explained to the patient that vitreous inside the eye is similar to jello inside a bowl. As the jello melts it can start to pull away from the bowl, similarly the vitreous throughout our lives can begin to pull away from the retina. That process is called a posterior vitreous detachment. In some cases, the vitreous can tug hard enough on the retina to form a retinal tear. I discussed  with the patient the signs and symptoms of a retinal detachment.  Do not rub the eye.      ICD-10-CM   1. Follow-up examination after eye surgery  Z09   2. Macular hole of left eye  H35.342 OCT, Retina - OU - Both Eyes  3. Posterior vitreous detachment of right eye  H43.811     1.  OS, visual acuity improved after macular hole repair surgery.  Preoperative vision of 2400 has improved to 20/70.  Will observe.  2.  3.  Ophthalmic Meds Ordered this visit:  No orders of the defined types were placed in this encounter.      Return in about 4 months (around 08/19/2020) for DILATE OU, OCT.  There are no Patient Instructions on file for this visit.   Explained the diagnoses, plan, and follow up with the patient and they expressed understanding.  Patient expressed understanding of the importance of proper follow up care.   Clent Demark Avalee Castrellon M.D. Diseases & Surgery of the Retina and Vitreous [facility] 04/18/20     Abbreviations: M myopia (nearsighted); A astigmatism; H hyperopia (farsighted); P presbyopia; Mrx spectacle prescription;  CTL contact lenses; OD right eye; OS left eye; OU both eyes  XT exotropia; ET esotropia; PEK punctate epithelial keratitis; PEE punctate epithelial erosions; DES dry eye syndrome; MGD meibomian gland dysfunction; ATs artificial tears; PFAT's preservative free artificial tears; [place] nuclear sclerotic cataract; PSC posterior subcapsular cataract; ERM epi-retinal membrane; PVD posterior vitreous detachment; RD retinal detachment; DM  diabetes mellitus; DR diabetic retinopathy; NPDR non-proliferative diabetic retinopathy; PDR proliferative diabetic retinopathy; CSME clinically significant macular edema; DME diabetic macular edema; dbh dot blot hemorrhages; CWS cotton wool spot; POAG primary open angle glaucoma; C/D cup-to-disc ratio; HVF humphrey visual field; GVF goldmann visual field; OCT optical coherence tomography; IOP intraocular pressure; BRVO  Branch retinal vein occlusion; CRVO central retinal vein occlusion; CRAO central retinal artery occlusion; BRAO branch retinal artery occlusion; RT retinal tear; SB scleral buckle; PPV pars plana vitrectomy; VH Vitreous hemorrhage; PRP panretinal laser photocoagulation; IVK intravitreal kenalog; VMT vitreomacular traction; MH Macular hole;  NVD neovascularization of the disc; NVE neovascularization elsewhere; AREDS age related eye disease study; ARMD age related macular degeneration; POAG primary open angle glaucoma; EBMD epithelial/anterior basement membrane dystrophy; ACIOL anterior chamber intraocular lens; IOL intraocular lens; PCIOL posterior chamber intraocular lens; Phaco/IOL phacoemulsification with intraocular lens placement; [place] photorefractive keratectomy; LASIK laser assisted in situ keratomileusis; HTN hypertension; DM diabetes mellitus; COPD chronic obstructive pulmonary disease

## 2020-04-18 NOTE — Assessment & Plan Note (Signed)

## 2020-09-02 ENCOUNTER — Encounter (INDEPENDENT_AMBULATORY_CARE_PROVIDER_SITE_OTHER): Payer: Medicare HMO | Admitting: Ophthalmology

## 2020-09-10 ENCOUNTER — Encounter (INDEPENDENT_AMBULATORY_CARE_PROVIDER_SITE_OTHER): Payer: Medicare HMO | Admitting: Ophthalmology

## 2020-09-18 DIAGNOSIS — Z794 Long term (current) use of insulin: Secondary | ICD-10-CM | POA: Diagnosis not present

## 2020-09-18 DIAGNOSIS — I10 Essential (primary) hypertension: Secondary | ICD-10-CM | POA: Diagnosis not present

## 2020-09-18 DIAGNOSIS — N1832 Chronic kidney disease, stage 3b: Secondary | ICD-10-CM | POA: Diagnosis not present

## 2020-09-18 DIAGNOSIS — E1169 Type 2 diabetes mellitus with other specified complication: Secondary | ICD-10-CM | POA: Diagnosis not present

## 2020-09-18 DIAGNOSIS — Z23 Encounter for immunization: Secondary | ICD-10-CM | POA: Diagnosis not present

## 2020-10-08 ENCOUNTER — Ambulatory Visit (INDEPENDENT_AMBULATORY_CARE_PROVIDER_SITE_OTHER): Payer: Medicare HMO | Admitting: Ophthalmology

## 2020-10-08 ENCOUNTER — Encounter (INDEPENDENT_AMBULATORY_CARE_PROVIDER_SITE_OTHER): Payer: Self-pay | Admitting: Ophthalmology

## 2020-10-08 ENCOUNTER — Other Ambulatory Visit: Payer: Self-pay

## 2020-10-08 DIAGNOSIS — H2511 Age-related nuclear cataract, right eye: Secondary | ICD-10-CM

## 2020-10-08 DIAGNOSIS — H43811 Vitreous degeneration, right eye: Secondary | ICD-10-CM | POA: Diagnosis not present

## 2020-10-08 DIAGNOSIS — H35342 Macular cyst, hole, or pseudohole, left eye: Secondary | ICD-10-CM | POA: Diagnosis not present

## 2020-10-08 NOTE — Progress Notes (Signed)
10/08/2020     CHIEF COMPLAINT Patient presents for Retina Follow Up   HISTORY OF PRESENT ILLNESS: Maurice Perkins is a 74 y.o. male who presents to the clinic today for:   HPI    Retina Follow Up    Patient presents with  PVD.  In both eyes.  Severity is moderate.  Duration of 4 months.  Since onset it is stable.  I, the attending physician,  performed the HPI with the patient and updated documentation appropriately.          Comments    4 Month f\u OU. OCT  Pt states no changes in vision. Pt states he sees occasional FOL and floaters. Pt states there is a "dip" he notices in vision where the hole was.       Last edited by Tilda Franco on 10/08/2020  9:04 AM. (History)      Referring physician: Levin Erp, MD [address] [place] [address] [place],  [place] [address]  HISTORICAL INFORMATION:   Selected notes from the MEDICAL RECORD NUMBER    Lab Results  Component Value Date   HGBA1C 8.3 (H) 09/22/2016     CURRENT MEDICATIONS: Current Outpatient Medications (Ophthalmic Drugs)  Medication Sig  . tetrahydrozoline 0.05 % ophthalmic solution Place 1-3 drops into both eyes 3 (three) times daily as needed (for itchy/scratchy eyes).   No current facility-administered medications for this visit. (Ophthalmic Drugs)   Current Outpatient Medications (Other)  Medication Sig  . ammonium lactate (LAC-HYDRIN) 12 % lotion Apply 1 application topically daily. Applied after patient has bathe  . aspirin EC 81 MG tablet Take 81 mg by mouth daily.  Marland Kitchen atenolol (TENORMIN) 50 MG tablet Take 25 mg by mouth every morning.   Marland Kitchen atorvastatin (LIPITOR) 40 MG tablet Take 40 mg by mouth daily.  . fenofibrate 160 MG tablet Take 160 mg by mouth daily.  . ferrous sulfate 325 (65 FE) MG tablet Take 1 tablet (325 mg total) by mouth 2 (two) times daily with a meal.  . glimepiride (AMARYL) 4 MG tablet Take 4 mg by mouth 2 (two) times daily.  . insulin aspart (NOVOLOG) 100 UNIT/ML injection  Inject 12 Units into the skin daily.   . Insulin Glargine (LANTUS) 100 UNIT/ML Solostar Pen Inject 60 Units into the skin every morning.   Marland Kitchen losartan-hydrochlorothiazide (HYZAAR) 50-12.5 MG per tablet Take 1 tablet by mouth every morning.   . metFORMIN (GLUCOPHAGE) 500 MG tablet Take 500-1,000 mg by mouth See admin instructions. 1000 mg at noon and 500 mg in the evening  . Misc Natural Products (GLUCOSAMINE CHONDROITIN MSM PO) Take 2 tablets by mouth daily.  . Multiple Vitamin (MULTIVITAMIN WITH MINERALS) TABS tablet Take 1 tablet by mouth daily.  . Probiotic Product (PROBIOTIC PO) Take 1 capsule by mouth daily.   No current facility-administered medications for this visit. (Other)      REVIEW OF SYSTEMS:    ALLERGIES Allergies  Allergen Reactions  . Penicillins Hives    Has patient had a PCN reaction causing immediate rash, facial/tongue/throat swelling, SOB or lightheadedness with hypotension:Yes Has patient had a PCN reaction causing severe rash involving mucus membranes or skin necrosis:unsure Has patient had a PCN reaction that required hospitalization:no Has patient had a PCN reaction occurring within the last 10 years:no If all of the above answers are "NO", then may proceed with Cephalosporin use.   Marland Kitchen Hydrocodone Nausea Only    PAST MEDICAL HISTORY Past Medical History:  Diagnosis Date  . Diabetes ([place])    insulin requiring type 2.   . Dry skin    pt has skin oil deficiency ischtiosis  . Hyperlipidemia   . Hypertension   . Other general symptoms(780.99)   . Pancreatic mass 2015   cystic mucinous neoplasm. opted for non-surgical observation. follwed with serial MRI/MRCP.     Past Surgical History:  Procedure Laterality Date  . ABDOMINAL EXPLORATION SURGERY  09/25/2015  . COLONOSCOPY     2004 dr Earlean Shawl  . COLONOSCOPY N/A 11/20/2017   Procedure: COLONOSCOPY;  Surgeon: Milus Banister, MD;  [facility]: [facility] ENDOSCOPY;  Service: Endoscopy;  Laterality: N/A;  . EUS N/A  05/17/2014   Procedure: UPPER ENDOSCOPIC ULTRASOUND (EUS) LINEAR;  Surgeon: Milus Banister, MD;  Location: [facility] ENDOSCOPY;  Service: Endoscopy;  Laterality: N/A;  . INSERTION OF MESH N/A 09/30/2016   Procedure: INSERTION OF MESH;  Surgeon: Stark Klein, MD;  Location: [place];  Service: General;  Laterality: N/A;  . LAPAROTOMY N/A 09/25/2015   Procedure: EXPLORATORY LAPAROTOMY small bowel resection;  Surgeon: Stark Klein, MD;  Location: [place];  Service: General;  Laterality: N/A;  . NASAL SINUS SURGERY  1970's  . pilonodal cyst     in high school-bottom of spine  . SHOULDER SURGERY Right 2000  . VENTRAL HERNIA REPAIR  09/30/2016   LAPROSCOPIC  . VENTRAL HERNIA REPAIR N/A 09/30/2016   Procedure: LAPAROSCOPIC VENTRAL HERNIA;  Surgeon: Stark Klein, MD;  Location: [facility] OR;  Service: General;  Laterality: N/A;    FAMILY HISTORY Family History  Problem Relation Age of Onset  . Heart disease Mother   . Breast cancer Mother        breast  . Colon cancer Neg Hx   . Stomach cancer Neg Hx   . Ulcerative colitis Neg Hx     SOCIAL HISTORY Social History   Tobacco Use  . Smoking status: Former Smoker    Packs/day: 2.00    Years: 15.00    Pack years: 30.00    Types: Cigarettes    Quit date: 12/15/1971    Years since quitting: 48.8  . Smokeless tobacco: Never Used  Substance Use Topics  . Alcohol use: No    Alcohol/week: 0.0 standard drinks  . Drug use: No         OPHTHALMIC EXAM:  Base Eye Exam    Visual Acuity (Snellen - Linear)      Right Left   Dist [place] 20/40 20/50 +   Dist ph cc 20/30 20/40 -2       Tonometry (Tonopen, 9:07 AM)      Right Left   Pressure 13 6       Pupils      Pupils Dark Light Shape React APD   Right PERRL 3 2 Round Slow None   Left PERRL 2 2 Round Slow None       Visual Fields (Counting fingers)      Left Right    Full Full       Neuro/Psych    Oriented x3: Yes   Mood/Affect: Normal       Dilation    Both eyes: 1.0% Mydriacyl, 2.5%  Phenylephrine @ 9:07 AM        Slit Lamp and Fundus Exam    External Exam      Right Left   External Normal Normal       Slit Lamp Exam      Right Left  Lids/Lashes Normal Normal   Conjunctiva/Sclera White and quiet White and quiet   Cornea Clear Clear   Anterior Chamber Deep and quiet Deep and quiet   Iris Round and reactive Round and reactive   Lens  Posterior chamber intraocular lens   Anterior Vitreous Normal Normal       Fundus Exam      Right Left   Posterior Vitreous  Clear and vitrectomized   Disc  Normal   C/D Ratio  0.4   Macula  Hole closed, normal anatomy, negative Watzke   Vessels  Normal   Periphery  Normal          IMAGING AND PROCEDURES  Imaging and Procedures for 10/08/20  OCT, Retina - OU - Both Eyes       Right Eye Quality was good. Scan locations included subfoveal. Central Foveal Thickness: 250. Findings include normal foveal contour.   Left Eye Quality was good. Scan locations included subfoveal. Central Foveal Thickness: 275.   Notes OD with incidental vitreous detachment  OS, macular hole now closed with continual provement in the outer retinal layer anatomy with presence of external limiting membrane, interdigitation zone and the photoreceptor layer.                ASSESSMENT/PLAN:  Macular hole of left eye Preoperative visual acuity of 20/80 has now improved to 20/50 in the left eye.  Outer photoreceptor layer in the interdigitation zone has been restored on OCT examination  Nuclear sclerotic cataract of right eye Has plans to proceed with cataract extraction with intraocular lens placement in the right eye in February 2022  Posterior vitreous detachment of right eye   The nature of posterior vitreous detachment was discussed with the patient as well as its physiology, its age prevalence, and its possible implication regarding retinal breaks and detachment.  An informational brochure was given to the patient.  All the  patient's questions were answered.  The patient was asked to return if new or different flashes or floaters develops.   Patient was instructed to contact office immediately if any changes were noticed. I explained to the patient that vitreous inside the eye is similar to jello inside a bowl. As the jello melts it can start to pull away from the bowl, similarly the vitreous throughout our lives can begin to pull away from the retina. That process is called a posterior vitreous detachment. In some cases, the vitreous can tug hard enough on the retina to form a retinal tear. I discussed with the patient the signs and symptoms of a retinal detachment.  Do not rub the eye.      ICD-10-CM   1. Posterior vitreous detachment of right eye  H43.811 OCT, Retina - OU - Both Eyes  2. Macular hole of left eye  H35.342   3. Nuclear sclerotic cataract of right eye  H25.11     1.  2.  3.  Ophthalmic Meds Ordered this visit:  No orders of the defined types were placed in this encounter.      Return in about 1 year (around 10/08/2021) for DILATE OU, OCT.  Patient Instructions  Instructed to contact the office promptly for new onset visual acuity decline or distortions    Explained the diagnoses, plan, and follow up with the patient and they expressed understanding.  Patient expressed understanding of the importance of proper follow up care.   Clent Demark [place] Dost M.D. Diseases & Surgery of the Retina and Vitreous [facility] 10/08/20     Abbreviations: M myopia (nearsighted); A astigmatism; H hyperopia (farsighted); P presbyopia; Mrx spectacle prescription;  CTL contact lenses; OD right eye; OS left eye; OU both eyes  XT exotropia; ET esotropia; PEK punctate epithelial keratitis; PEE punctate epithelial erosions; DES dry eye syndrome; MGD meibomian gland dysfunction; ATs artificial tears; PFAT's preservative free artificial tears; [place] nuclear sclerotic cataract; PSC posterior  subcapsular cataract; ERM epi-retinal membrane; PVD posterior vitreous detachment; RD retinal detachment; DM diabetes mellitus; DR diabetic retinopathy; NPDR non-proliferative diabetic retinopathy; PDR proliferative diabetic retinopathy; CSME clinically significant macular edema; DME diabetic macular edema; dbh dot blot hemorrhages; CWS cotton wool spot; POAG primary open angle glaucoma; C/D cup-to-disc ratio; HVF humphrey visual field; GVF goldmann visual field; OCT optical coherence tomography; IOP intraocular pressure; BRVO Branch retinal vein occlusion; CRVO central retinal vein occlusion; CRAO central retinal artery occlusion; BRAO branch retinal artery occlusion; RT retinal tear; SB scleral buckle; PPV pars plana vitrectomy; VH Vitreous hemorrhage; PRP panretinal laser photocoagulation; IVK intravitreal kenalog; VMT vitreomacular traction; MH Macular hole;  NVD neovascularization of the disc; NVE neovascularization elsewhere; AREDS age related eye disease study; ARMD age related macular degeneration; POAG primary open angle glaucoma; EBMD epithelial/anterior basement membrane dystrophy; ACIOL anterior chamber intraocular lens; IOL intraocular lens; PCIOL posterior chamber intraocular lens; Phaco/IOL phacoemulsification with intraocular lens placement; [place] photorefractive keratectomy; LASIK laser assisted in situ keratomileusis; HTN hypertension; DM diabetes mellitus; COPD chronic obstructive pulmonary disease

## 2020-10-08 NOTE — Assessment & Plan Note (Signed)
Has plans to proceed with cataract extraction with intraocular lens placement in the right eye in February 2022

## 2020-10-08 NOTE — Assessment & Plan Note (Signed)
Preoperative visual acuity of 20/80 has now improved to 20/50 in the left eye.  Outer photoreceptor layer in the interdigitation zone has been restored on OCT examination

## 2020-10-08 NOTE — Assessment & Plan Note (Signed)

## 2020-10-08 NOTE — Patient Instructions (Signed)
Instructed to contact the office promptly for new onset visual acuity decline or distortions

## 2020-11-21 DIAGNOSIS — H903 Sensorineural hearing loss, bilateral: Secondary | ICD-10-CM | POA: Diagnosis not present

## 2020-12-05 DIAGNOSIS — H903 Sensorineural hearing loss, bilateral: Secondary | ICD-10-CM | POA: Diagnosis not present

## 2020-12-19 DIAGNOSIS — N1832 Chronic kidney disease, stage 3b: Secondary | ICD-10-CM | POA: Diagnosis not present

## 2020-12-19 DIAGNOSIS — I10 Essential (primary) hypertension: Secondary | ICD-10-CM | POA: Diagnosis not present

## 2020-12-19 DIAGNOSIS — E1169 Type 2 diabetes mellitus with other specified complication: Secondary | ICD-10-CM | POA: Diagnosis not present

## 2020-12-19 DIAGNOSIS — Z794 Long term (current) use of insulin: Secondary | ICD-10-CM | POA: Diagnosis not present

## 2020-12-27 DIAGNOSIS — R059 Cough, unspecified: Secondary | ICD-10-CM | POA: Diagnosis not present

## 2020-12-27 DIAGNOSIS — J189 Pneumonia, unspecified organism: Secondary | ICD-10-CM | POA: Diagnosis not present

## 2020-12-27 DIAGNOSIS — Z1152 Encounter for screening for COVID-19: Secondary | ICD-10-CM | POA: Diagnosis not present

## 2020-12-27 DIAGNOSIS — Z20828 Contact with and (suspected) exposure to other viral communicable diseases: Secondary | ICD-10-CM | POA: Diagnosis not present

## 2020-12-27 DIAGNOSIS — E1169 Type 2 diabetes mellitus with other specified complication: Secondary | ICD-10-CM | POA: Diagnosis not present

## 2021-01-03 DIAGNOSIS — M25511 Pain in right shoulder: Secondary | ICD-10-CM | POA: Diagnosis not present

## 2021-01-03 DIAGNOSIS — R739 Hyperglycemia, unspecified: Secondary | ICD-10-CM | POA: Diagnosis not present

## 2021-01-03 DIAGNOSIS — W19XXXA Unspecified fall, initial encounter: Secondary | ICD-10-CM | POA: Diagnosis not present

## 2021-01-03 DIAGNOSIS — L989 Disorder of the skin and subcutaneous tissue, unspecified: Secondary | ICD-10-CM | POA: Diagnosis not present

## 2021-01-03 DIAGNOSIS — N1832 Chronic kidney disease, stage 3b: Secondary | ICD-10-CM | POA: Diagnosis not present

## 2021-01-03 DIAGNOSIS — U071 COVID-19: Secondary | ICD-10-CM | POA: Diagnosis not present

## 2021-01-03 DIAGNOSIS — D6859 Other primary thrombophilia: Secondary | ICD-10-CM | POA: Diagnosis not present

## 2021-01-03 DIAGNOSIS — E1169 Type 2 diabetes mellitus with other specified complication: Secondary | ICD-10-CM | POA: Diagnosis not present

## 2021-01-15 DIAGNOSIS — H2511 Age-related nuclear cataract, right eye: Secondary | ICD-10-CM | POA: Diagnosis not present

## 2021-01-15 DIAGNOSIS — Z961 Presence of intraocular lens: Secondary | ICD-10-CM | POA: Diagnosis not present

## 2021-01-15 DIAGNOSIS — Z9889 Other specified postprocedural states: Secondary | ICD-10-CM | POA: Diagnosis not present

## 2021-01-15 DIAGNOSIS — D3132 Benign neoplasm of left choroid: Secondary | ICD-10-CM | POA: Diagnosis not present

## 2021-01-15 DIAGNOSIS — E119 Type 2 diabetes mellitus without complications: Secondary | ICD-10-CM | POA: Diagnosis not present

## 2021-01-24 DIAGNOSIS — Z125 Encounter for screening for malignant neoplasm of prostate: Secondary | ICD-10-CM | POA: Diagnosis not present

## 2021-01-24 DIAGNOSIS — E781 Pure hyperglyceridemia: Secondary | ICD-10-CM | POA: Diagnosis not present

## 2021-01-24 DIAGNOSIS — E1169 Type 2 diabetes mellitus with other specified complication: Secondary | ICD-10-CM | POA: Diagnosis not present

## 2021-01-24 DIAGNOSIS — E538 Deficiency of other specified B group vitamins: Secondary | ICD-10-CM | POA: Diagnosis not present

## 2021-01-31 DIAGNOSIS — E1169 Type 2 diabetes mellitus with other specified complication: Secondary | ICD-10-CM | POA: Diagnosis not present

## 2021-01-31 DIAGNOSIS — R946 Abnormal results of thyroid function studies: Secondary | ICD-10-CM | POA: Diagnosis not present

## 2021-01-31 DIAGNOSIS — Z1331 Encounter for screening for depression: Secondary | ICD-10-CM | POA: Diagnosis not present

## 2021-01-31 DIAGNOSIS — M25519 Pain in unspecified shoulder: Secondary | ICD-10-CM | POA: Diagnosis not present

## 2021-01-31 DIAGNOSIS — D692 Other nonthrombocytopenic purpura: Secondary | ICD-10-CM | POA: Diagnosis not present

## 2021-01-31 DIAGNOSIS — Z0000 Encounter for general adult medical examination without abnormal findings: Secondary | ICD-10-CM | POA: Diagnosis not present

## 2021-01-31 DIAGNOSIS — N1832 Chronic kidney disease, stage 3b: Secondary | ICD-10-CM | POA: Diagnosis not present

## 2021-01-31 DIAGNOSIS — R82998 Other abnormal findings in urine: Secondary | ICD-10-CM | POA: Diagnosis not present

## 2021-01-31 DIAGNOSIS — Z1389 Encounter for screening for other disorder: Secondary | ICD-10-CM | POA: Diagnosis not present

## 2021-01-31 DIAGNOSIS — E781 Pure hyperglyceridemia: Secondary | ICD-10-CM | POA: Diagnosis not present

## 2021-01-31 DIAGNOSIS — R739 Hyperglycemia, unspecified: Secondary | ICD-10-CM | POA: Diagnosis not present

## 2021-01-31 DIAGNOSIS — Z1212 Encounter for screening for malignant neoplasm of rectum: Secondary | ICD-10-CM | POA: Diagnosis not present

## 2021-01-31 DIAGNOSIS — I10 Essential (primary) hypertension: Secondary | ICD-10-CM | POA: Diagnosis not present

## 2021-02-05 DIAGNOSIS — M25611 Stiffness of right shoulder, not elsewhere classified: Secondary | ICD-10-CM | POA: Diagnosis not present

## 2021-02-05 DIAGNOSIS — M25511 Pain in right shoulder: Secondary | ICD-10-CM | POA: Diagnosis not present

## 2021-02-12 DIAGNOSIS — M25511 Pain in right shoulder: Secondary | ICD-10-CM | POA: Diagnosis not present

## 2021-02-12 DIAGNOSIS — M25611 Stiffness of right shoulder, not elsewhere classified: Secondary | ICD-10-CM | POA: Diagnosis not present

## 2021-02-18 DIAGNOSIS — M25611 Stiffness of right shoulder, not elsewhere classified: Secondary | ICD-10-CM | POA: Diagnosis not present

## 2021-02-18 DIAGNOSIS — M25511 Pain in right shoulder: Secondary | ICD-10-CM | POA: Diagnosis not present

## 2021-02-24 DIAGNOSIS — M25511 Pain in right shoulder: Secondary | ICD-10-CM | POA: Diagnosis not present

## 2021-02-26 DIAGNOSIS — M25511 Pain in right shoulder: Secondary | ICD-10-CM | POA: Diagnosis not present

## 2021-02-26 DIAGNOSIS — M25611 Stiffness of right shoulder, not elsewhere classified: Secondary | ICD-10-CM | POA: Diagnosis not present

## 2021-03-05 DIAGNOSIS — M25611 Stiffness of right shoulder, not elsewhere classified: Secondary | ICD-10-CM | POA: Diagnosis not present

## 2021-03-05 DIAGNOSIS — M25511 Pain in right shoulder: Secondary | ICD-10-CM | POA: Diagnosis not present

## 2021-03-17 DIAGNOSIS — M25511 Pain in right shoulder: Secondary | ICD-10-CM | POA: Diagnosis not present

## 2021-03-19 DIAGNOSIS — I10 Essential (primary) hypertension: Secondary | ICD-10-CM | POA: Diagnosis not present

## 2021-03-19 DIAGNOSIS — E1169 Type 2 diabetes mellitus with other specified complication: Secondary | ICD-10-CM | POA: Diagnosis not present

## 2021-03-19 DIAGNOSIS — Z794 Long term (current) use of insulin: Secondary | ICD-10-CM | POA: Diagnosis not present

## 2021-03-19 DIAGNOSIS — N1832 Chronic kidney disease, stage 3b: Secondary | ICD-10-CM | POA: Diagnosis not present

## 2021-03-19 DIAGNOSIS — E781 Pure hyperglyceridemia: Secondary | ICD-10-CM | POA: Diagnosis not present

## 2021-03-24 DIAGNOSIS — M7541 Impingement syndrome of right shoulder: Secondary | ICD-10-CM | POA: Diagnosis not present

## 2021-03-24 DIAGNOSIS — M25511 Pain in right shoulder: Secondary | ICD-10-CM | POA: Diagnosis not present

## 2021-04-29 DIAGNOSIS — S46011A Strain of muscle(s) and tendon(s) of the rotator cuff of right shoulder, initial encounter: Secondary | ICD-10-CM | POA: Diagnosis not present

## 2021-04-29 DIAGNOSIS — Z791 Long term (current) use of non-steroidal anti-inflammatories (NSAID): Secondary | ICD-10-CM | POA: Diagnosis not present

## 2021-04-29 DIAGNOSIS — I10 Essential (primary) hypertension: Secondary | ICD-10-CM | POA: Diagnosis not present

## 2021-04-29 DIAGNOSIS — M659 Synovitis and tenosynovitis, unspecified: Secondary | ICD-10-CM | POA: Diagnosis not present

## 2021-04-29 DIAGNOSIS — M24111 Other articular cartilage disorders, right shoulder: Secondary | ICD-10-CM | POA: Diagnosis not present

## 2021-04-29 DIAGNOSIS — Z4889 Encounter for other specified surgical aftercare: Secondary | ICD-10-CM | POA: Diagnosis not present

## 2021-04-29 DIAGNOSIS — I890 Lymphedema, not elsewhere classified: Secondary | ICD-10-CM | POA: Diagnosis not present

## 2021-04-29 DIAGNOSIS — M7541 Impingement syndrome of right shoulder: Secondary | ICD-10-CM | POA: Diagnosis not present

## 2021-04-29 DIAGNOSIS — E785 Hyperlipidemia, unspecified: Secondary | ICD-10-CM | POA: Diagnosis not present

## 2021-04-29 DIAGNOSIS — M75121 Complete rotator cuff tear or rupture of right shoulder, not specified as traumatic: Secondary | ICD-10-CM | POA: Diagnosis not present

## 2021-04-29 DIAGNOSIS — M94211 Chondromalacia, right shoulder: Secondary | ICD-10-CM | POA: Diagnosis not present

## 2021-04-29 DIAGNOSIS — M7521 Bicipital tendinitis, right shoulder: Secondary | ICD-10-CM | POA: Diagnosis not present

## 2021-04-29 DIAGNOSIS — Z79899 Other long term (current) drug therapy: Secondary | ICD-10-CM | POA: Diagnosis not present

## 2021-04-29 DIAGNOSIS — E118 Type 2 diabetes mellitus with unspecified complications: Secondary | ICD-10-CM | POA: Diagnosis not present

## 2021-04-29 DIAGNOSIS — S43431A Superior glenoid labrum lesion of right shoulder, initial encounter: Secondary | ICD-10-CM | POA: Diagnosis not present

## 2021-04-29 DIAGNOSIS — E663 Overweight: Secondary | ICD-10-CM | POA: Diagnosis not present

## 2021-04-29 DIAGNOSIS — G8918 Other acute postprocedural pain: Secondary | ICD-10-CM | POA: Diagnosis not present

## 2021-04-29 DIAGNOSIS — M25511 Pain in right shoulder: Secondary | ICD-10-CM | POA: Diagnosis not present

## 2021-05-07 DIAGNOSIS — M25611 Stiffness of right shoulder, not elsewhere classified: Secondary | ICD-10-CM | POA: Diagnosis not present

## 2021-05-07 DIAGNOSIS — M25511 Pain in right shoulder: Secondary | ICD-10-CM | POA: Diagnosis not present

## 2021-05-08 DIAGNOSIS — L821 Other seborrheic keratosis: Secondary | ICD-10-CM | POA: Diagnosis not present

## 2021-05-08 DIAGNOSIS — D225 Melanocytic nevi of trunk: Secondary | ICD-10-CM | POA: Diagnosis not present

## 2021-05-08 DIAGNOSIS — D485 Neoplasm of uncertain behavior of skin: Secondary | ICD-10-CM | POA: Diagnosis not present

## 2021-05-09 DIAGNOSIS — M25511 Pain in right shoulder: Secondary | ICD-10-CM | POA: Diagnosis not present

## 2021-05-09 DIAGNOSIS — M25611 Stiffness of right shoulder, not elsewhere classified: Secondary | ICD-10-CM | POA: Diagnosis not present

## 2021-05-15 DIAGNOSIS — M25611 Stiffness of right shoulder, not elsewhere classified: Secondary | ICD-10-CM | POA: Diagnosis not present

## 2021-05-15 DIAGNOSIS — M25511 Pain in right shoulder: Secondary | ICD-10-CM | POA: Diagnosis not present

## 2021-05-21 DIAGNOSIS — M25611 Stiffness of right shoulder, not elsewhere classified: Secondary | ICD-10-CM | POA: Diagnosis not present

## 2021-05-21 DIAGNOSIS — M25511 Pain in right shoulder: Secondary | ICD-10-CM | POA: Diagnosis not present

## 2021-05-23 DIAGNOSIS — M25511 Pain in right shoulder: Secondary | ICD-10-CM | POA: Diagnosis not present

## 2021-05-23 DIAGNOSIS — M25611 Stiffness of right shoulder, not elsewhere classified: Secondary | ICD-10-CM | POA: Diagnosis not present

## 2021-05-27 DIAGNOSIS — M25611 Stiffness of right shoulder, not elsewhere classified: Secondary | ICD-10-CM | POA: Diagnosis not present

## 2021-05-27 DIAGNOSIS — M25511 Pain in right shoulder: Secondary | ICD-10-CM | POA: Diagnosis not present

## 2021-05-29 DIAGNOSIS — M25611 Stiffness of right shoulder, not elsewhere classified: Secondary | ICD-10-CM | POA: Diagnosis not present

## 2021-05-29 DIAGNOSIS — M25511 Pain in right shoulder: Secondary | ICD-10-CM | POA: Diagnosis not present

## 2021-06-03 DIAGNOSIS — M25511 Pain in right shoulder: Secondary | ICD-10-CM | POA: Diagnosis not present

## 2021-06-03 DIAGNOSIS — M25611 Stiffness of right shoulder, not elsewhere classified: Secondary | ICD-10-CM | POA: Diagnosis not present

## 2021-06-06 DIAGNOSIS — M25611 Stiffness of right shoulder, not elsewhere classified: Secondary | ICD-10-CM | POA: Diagnosis not present

## 2021-06-06 DIAGNOSIS — M25511 Pain in right shoulder: Secondary | ICD-10-CM | POA: Diagnosis not present

## 2021-06-09 DIAGNOSIS — L570 Actinic keratosis: Secondary | ICD-10-CM | POA: Diagnosis not present

## 2021-06-09 DIAGNOSIS — D0461 Carcinoma in situ of skin of right upper limb, including shoulder: Secondary | ICD-10-CM | POA: Diagnosis not present

## 2021-06-09 DIAGNOSIS — C44629 Squamous cell carcinoma of skin of left upper limb, including shoulder: Secondary | ICD-10-CM | POA: Diagnosis not present

## 2021-06-09 DIAGNOSIS — Z85828 Personal history of other malignant neoplasm of skin: Secondary | ICD-10-CM | POA: Diagnosis not present

## 2021-06-09 DIAGNOSIS — D0422 Carcinoma in situ of skin of left ear and external auricular canal: Secondary | ICD-10-CM | POA: Diagnosis not present

## 2021-06-09 DIAGNOSIS — M25511 Pain in right shoulder: Secondary | ICD-10-CM | POA: Diagnosis not present

## 2021-06-09 DIAGNOSIS — M25611 Stiffness of right shoulder, not elsewhere classified: Secondary | ICD-10-CM | POA: Diagnosis not present

## 2021-06-11 DIAGNOSIS — M25611 Stiffness of right shoulder, not elsewhere classified: Secondary | ICD-10-CM | POA: Diagnosis not present

## 2021-06-11 DIAGNOSIS — M25511 Pain in right shoulder: Secondary | ICD-10-CM | POA: Diagnosis not present

## 2021-06-19 DIAGNOSIS — I10 Essential (primary) hypertension: Secondary | ICD-10-CM | POA: Diagnosis not present

## 2021-06-19 DIAGNOSIS — E1169 Type 2 diabetes mellitus with other specified complication: Secondary | ICD-10-CM | POA: Diagnosis not present

## 2021-06-19 DIAGNOSIS — N1832 Chronic kidney disease, stage 3b: Secondary | ICD-10-CM | POA: Diagnosis not present

## 2021-06-19 DIAGNOSIS — E781 Pure hyperglyceridemia: Secondary | ICD-10-CM | POA: Diagnosis not present

## 2021-06-19 DIAGNOSIS — Z794 Long term (current) use of insulin: Secondary | ICD-10-CM | POA: Diagnosis not present

## 2021-06-25 DIAGNOSIS — H903 Sensorineural hearing loss, bilateral: Secondary | ICD-10-CM | POA: Diagnosis not present

## 2021-07-14 DIAGNOSIS — Z4789 Encounter for other orthopedic aftercare: Secondary | ICD-10-CM | POA: Diagnosis not present

## 2021-09-04 NOTE — Patient Instructions (Addendum)
DUE TO COVID-19 ONLY ONE VISITOR IS ALLOWED TO COME WITH YOU AND STAY IN THE WAITING ROOM ONLY DURING PRE OP AND PROCEDURE DAY OF SURGERY IF YOU ARE GOING HOME AFTER SURGERY. IF YOU ARE SPENDING THE NIGHT 2 PEOPLE MAY VISIT WITH YOU IN YOUR PRIVATE ROOM AFTER SURGERY UNTIL VISITING  HOURS ARE OVER AT 800 PM AND THE 2 VISITORS CANNOT SPEND THE NIGHT.                  Maurice Perkins     Your procedure is scheduled on: 09/18/21   Report to [facility] Main  Entrance   Report to admitting at   9:45 AM     Call this number if you have problems the morning of surgery [phone]    No food after midnight.    You may have clear liquid until 9:30 AM.    At 9:00 AM drink pre surgery drink.   Nothing by mouth after 9:30 AM.    CLEAR LIQUID DIET   Foods Allowed                                                                     Foods Excluded  Coffee and tea, regular and decaf                             liquids that you cannot  Plain Jell-O any favor except red or purple                                           see through such as: Fruit ices (not with fruit pulp)                                     milk, soups, orange juice  Iced Popsicles                                    All solid food Carbonated beverages, regular and diet                                    Cranberry, grape and apple juices Sports drinks like Gatorade Lightly seasoned clear broth or consume(fat free) Sugar     BRUSH YOUR TEETH MORNING OF SURGERY AND RINSE YOUR MOUTH OUT, NO CHEWING GUM CANDY OR MINTS.     Take these medicines the morning of surgery with A SIP OF WATER: Atonolol                      How to Manage Your Diabetes Before and After Surgery  Why is it important to control my blood sugar before and after surgery? Improving blood sugar levels before and after surgery helps healing and can limit problems. A way of improving blood sugar control is eating a healthy diet by:  Eating  less sugar and carbohydrates  Increasing activity/exercise  Talking with your doctor about reaching your blood sugar goals High blood sugars (greater than 180 mg/dL) can raise your risk of infections and slow your recovery, so you will need to focus on controlling your diabetes during the weeks before surgery. Make sure that the doctor who takes care of your diabetes knows about your planned surgery including the date and location.  How do I manage my blood sugar before surgery? Check your blood sugar at least 4 times a day, starting 2 days before surgery, to make sure that the level is not too high or low. Check your blood sugar the morning of your surgery when you wake up and every 2 hours until you get to the Short Stay unit. If your blood sugar is less than 70 mg/dL, you will need to treat for low blood sugar: Do not take insulin. Treat a low blood sugar (less than 70 mg/dL) with  cup of clear juice (cranberry or apple), 4 glucose tablets, OR glucose gel. Recheck blood sugar in 15 minutes after treatment (to make sure it is greater than 70 mg/dL). If your blood sugar is not greater than 70 mg/dL on recheck, call [phone] for further instructions. Report your blood sugar to the short stay nurse when you get to Short Stay.  If you are admitted to the hospital after surgery: Your blood sugar will be checked by the staff and you will probably be given insulin after surgery (instead of oral diabetes medicines) to make sure you have good blood sugar levels. The goal for blood sugar control after surgery is 80-180 mg/dL.   WHAT DO I DO ABOUT MY DIABETES MEDICATION?  Hold Jardiance the day before surgery  Do not take oral diabetes medicines (pills) the morning of surgery.  THE NIGHT BEFORE SURGERY, take   35 units of  insulin.       THE MORNING OF SURGERY, take  35 units of   Toujeo  insulin.  The day of surgery, do not take other diabetes injectables, including Byetta (exenatide),  Bydureon (exenatide ER), Victoza (liraglutide), or Trulicity (dulaglutide).  If your CBG is greater than 220 mg/dL, you may take  of your sliding scale  (correction) dose of insulin.            You may not have any metal on your body including              piercings  Do not wear jewelry,  lotions, powders or deodorant             Men may shave face and neck.   Do not bring valuables to the hospital. [place].  Contacts, dentures or bridgework may not be worn into surgery.      Patients discharged the day of surgery will not be allowed to drive home. IF YOU ARE HAVING SURGERY AND GOING HOME THE SAME DAY, YOU MUST HAVE AN ADULT TO DRIVE YOU HOME AND BE WITH YOU FOR 24 HOURS. YOU MAY GO HOME BY TAXI OR UBER OR ORTHERWISE, BUT AN ADULT MUST ACCOMPANY YOU HOME AND STAY WITH YOU FOR 24 HOURS.  Name and phone number of your driver:  Special Instructions: N/A              Please read over the following fact sheets you were given: _____________________________________________________________________  [facility]-  Preparing for Total Shoulder Arthroplasty    Before surgery, you can play an important role. Because skin is not sterile, your skin needs to be as free of germs as possible. You can reduce the number of germs on your skin by using the following products. Benzoyl Peroxide Gel Reduces the number of germs present on the skin Applied twice a day to shoulder area starting two days before surgery    ==================================================================  Please follow these instructions carefully:  BENZOYL PEROXIDE 5% GEL  Please do not use if you have an allergy to benzoyl peroxide.   If your skin becomes reddened/irritated stop using the benzoyl peroxide.  Starting two days before surgery, apply as follows: Apply benzoyl peroxide in the morning and at night. Apply after taking a shower. If you are not taking a shower  clean entire shoulder front, back, and side along with the armpit with a clean wet washcloth.  Place a quarter-sized dollop on your shoulder and rub in thoroughly, making sure to cover the front, back, and side of your shoulder, along with the armpit.   2 days before ____ AM   ____ PM              1 day before ____ AM   ____ PM                         Do this twice a day for two days.  (Last application is the night before surgery, AFTER using the CHG soap as described below).  Do NOT apply benzoyl peroxide gel on the day of surgery.            [place] - Preparing for Surgery Before surgery, you can play an important role.  Because skin is not sterile, your skin needs to be as free of germs as possible.  You can reduce the number of germs on your skin by washing with CHG (chlorahexidine gluconate) soap before surgery.  CHG is an antiseptic cleaner which kills germs and bonds with the skin to continue killing germs even after washing. Please DO NOT use if you have an allergy to CHG or antibacterial soaps.  If your skin becomes reddened/irritated stop using the CHG and inform your nurse when you arrive at Short Stay.  You may shave your face/neck. Please follow these instructions carefully:  1.  Shower with CHG Soap the night before surgery and the  morning of Surgery.  2.  If you choose to wash your hair, wash your hair first as usual with your  normal  shampoo.  3.  After you shampoo, rinse your hair and body thoroughly to remove the  shampoo.                            4.  Use CHG as you would any other liquid soap.  You can apply chg directly  to the skin and wash                       Gently with a scrungie or clean washcloth.  5.  Apply the CHG Soap to your body ONLY FROM THE NECK DOWN.   Do not use on face/ open                           Wound or open sores. Avoid contact with eyes, ears  mouth and genitals (private parts).                       Wash face,  Genitals (private parts) with  your normal soap.             6.  Wash thoroughly, paying special attention to the area where your surgery  will be performed.  7.  Thoroughly rinse your body with warm water from the neck down.  8.  DO NOT shower/wash with your normal soap after using and rinsing off  the CHG Soap.                9.  Pat yourself dry with a clean towel.            10.  Wear clean pajamas.            11.  Place clean sheets on your bed the night of your first shower and do not  sleep with pets. Day of Surgery : Do not apply any lotions/deodorants the morning of surgery.  Please wear clean clothes to the hospital/surgery center.  FAILURE TO FOLLOW THESE INSTRUCTIONS MAY RESULT IN THE CANCELLATION OF YOUR SURGERY PATIENT SIGNATURE_________________________________  NURSE SIGNATURE__________________________________  ________________________________________________________________________

## 2021-09-05 ENCOUNTER — Encounter (HOSPITAL_COMMUNITY): Payer: Self-pay

## 2021-09-05 ENCOUNTER — Other Ambulatory Visit: Payer: Self-pay

## 2021-09-05 ENCOUNTER — Encounter (HOSPITAL_COMMUNITY)
Admission: RE | Admit: 2021-09-05 | Discharge: 2021-09-05 | Disposition: A | Discharge status: Home / Self Care | Payer: Medicare HMO | Source: Ambulatory Visit | Attending: Orthopedic Surgery | Admitting: Orthopedic Surgery

## 2021-09-05 DIAGNOSIS — Z01818 Encounter for other preprocedural examination: Secondary | ICD-10-CM | POA: Diagnosis not present

## 2021-09-05 DIAGNOSIS — E118 Type 2 diabetes mellitus with unspecified complications: Secondary | ICD-10-CM | POA: Diagnosis not present

## 2021-09-05 LAB — SURGICAL PCR SCREEN
MRSA, PCR: NEGATIVE
Staphylococcus aureus: POSITIVE — AB

## 2021-09-05 LAB — CBC
HCT: 48.7 % (ref 39.0–52.0)
Hemoglobin: 16 g/dL (ref 13.0–17.0)
MCH: 30.1 pg (ref 26.0–34.0)
MCHC: 32.9 g/dL (ref 30.0–36.0)
MCV: 91.7 fL (ref 80.0–100.0)
Platelets: 245 10*3/uL (ref 150–400)
RBC: 5.31 MIL/uL (ref 4.22–5.81)
RDW: 14.3 % (ref 11.5–15.5)
WBC: 7.4 10*3/uL (ref 4.0–10.5)
nRBC: 0 % (ref 0.0–0.2)

## 2021-09-05 LAB — BASIC METABOLIC PANEL
Anion gap: 10 (ref 5–15)
BUN: 28 mg/dL — ABNORMAL HIGH (ref 8–23)
CO2: 27 mmol/L (ref 22–32)
Calcium: 9.4 mg/dL (ref 8.9–10.3)
Chloride: 103 mmol/L (ref 98–111)
Creatinine, Ser: 1.71 mg/dL — ABNORMAL HIGH (ref 0.61–1.24)
GFR, Estimated: 41 mL/min — ABNORMAL LOW (ref >60)
Glucose, Bld: 133 mg/dL — ABNORMAL HIGH (ref 70–99)
Potassium: 4.5 mmol/L (ref 3.5–5.1)
Sodium: 140 mmol/L (ref 135–145)

## 2021-09-05 LAB — GLUCOSE, CAPILLARY: Glucose-Capillary: 148 mg/dL — ABNORMAL HIGH (ref 70–99)

## 2021-09-05 LAB — HEMOGLOBIN A1C
Hgb A1c MFr Bld: 8.6 % — ABNORMAL HIGH (ref 4.8–5.6)
Mean Plasma Glucose: 200.12 mg/dL

## 2021-09-05 NOTE — Progress Notes (Addendum)
COVID test- NA  PCP - Dr. Dannielle Huh LOV 7/29//22 Cardiologist - none  Chest x-ray - no EKG - 09/05/21-chart Stress Test - 2012 ECHO - no Cardiac Cath - no Pacemaker/ICD device last checked:NA  Sleep Study - no CPAP -   Fasting Blood Sugar - 119-189 Checks Blood Sugar __QD___ times a day  Blood Thinner Instructions:asa 81/ Dr. Francesco Sor Aspirin Instructions:none. Pt will call  Last Dose:  Anesthesia review: yes  Patient denies shortness of breath, fever, cough and chest pain at PAT appointment Pt has no SOB with activities.  Patient verbalized understanding of instructions that were given to them at the PAT appointment. Patient was also instructed that they will need to review over the PAT instructions again at home before surgery. Yes Creat is 1.71, A1c is 8.6

## 2021-09-10 DIAGNOSIS — Z85828 Personal history of other malignant neoplasm of skin: Secondary | ICD-10-CM | POA: Diagnosis not present

## 2021-09-10 DIAGNOSIS — L570 Actinic keratosis: Secondary | ICD-10-CM | POA: Diagnosis not present

## 2021-09-10 DIAGNOSIS — D485 Neoplasm of uncertain behavior of skin: Secondary | ICD-10-CM | POA: Diagnosis not present

## 2021-09-11 ENCOUNTER — Encounter (HOSPITAL_COMMUNITY): Payer: Self-pay | Admitting: Physician Assistant

## 2021-09-11 NOTE — Progress Notes (Signed)
Anesthesia Chart Review   Case: [id number] Date/Time: 09/18/21 1215   Procedure: REVERSE SHOULDER ARTHROPLASTY (Right: Shoulder) - 177min   Anesthesia type: General   Pre-op diagnosis: Right shoulder rotator cuff tear arthropathy   Location: [facility] ROOM 06 / [facility]   Surgeons: Justice Britain, MD       DISCUSSION:75 y.o. former smoker with h/o HTN, insulin dependent DM II, renal insufficiency, right shoulder rotator cuff tear scheduled for above procedure 09/18/2021 with Dr. Justice Britain.   A1C 8.6, Dr. Susie Cassette office made aware.  VS: BP 132/80   Pulse 70   Temp 36.8 C (Oral)   Resp 20   Ht 6\' 3"  (1.905 m)   Wt 93.9 kg   SpO2 99%   BMI 25.87 kg/m   PROVIDERS: Sueanne Margarita, DO is PCP    LABS:  Labs forwarded to surgeon (all labs ordered are listed, but only abnormal results are displayed)  Labs Reviewed  SURGICAL PCR SCREEN - Abnormal; Notable for the following components:      Result Value   Staphylococcus aureus POSITIVE (*)    All other components within normal limits  GLUCOSE, CAPILLARY - Abnormal; Notable for the following components:   Glucose-Capillary 148 (*)    All other components within normal limits  BASIC METABOLIC PANEL - Abnormal; Notable for the following components:   Glucose, Bld 133 (*)    BUN 28 (*)    Creatinine, Ser 1.71 (*)    GFR, Estimated 41 (*)    All other components within normal limits  HEMOGLOBIN A1C - Abnormal; Notable for the following components:   Hgb A1c MFr Bld 8.6 (*)    All other components within normal limits  CBC     IMAGES:   EKG: 09/05/2021 Rate 66 bpm  Sinus rhythm with 1st degree AV block    CV:  Past Medical History:  Diagnosis Date   Diabetes ([place])    insulin requiring type 2.    Dry skin    pt has skin oil deficiency ischtiosis   Hyperlipidemia    Hypertension    Other general symptoms(780.99)    Pancreatic mass 2015   cystic mucinous neoplasm. opted for non-surgical observation. follwed with serial  MRI/MRCP.      Past Surgical History:  Procedure Laterality Date   ABDOMINAL EXPLORATION SURGERY  09/25/2015   COLONOSCOPY     2004 dr Earlean Shawl   COLONOSCOPY N/A 11/20/2017   Procedure: COLONOSCOPY;  Surgeon: Milus Banister, MD;  Location: [facility] ENDOSCOPY;  Service: Endoscopy;  Laterality: N/A;   EUS N/A 05/17/2014   Procedure: UPPER ENDOSCOPIC ULTRASOUND (EUS) LINEAR;  Surgeon: Milus Banister, MD;  Location: [facility] ENDOSCOPY;  Service: Endoscopy;  Laterality: N/A;   INSERTION OF MESH N/A 09/30/2016   Procedure: INSERTION OF MESH;  Surgeon: Stark Klein, MD;  Location: [place];  Service: General;  Laterality: N/A;   LAPAROTOMY N/A 09/25/2015   Procedure: EXPLORATORY LAPAROTOMY small bowel resection;  Surgeon: Stark Klein, MD;  Location: [place];  Service: General;  Laterality: N/A;   NASAL SINUS SURGERY  1970's   pilonodal cyst     in high school-bottom of spine   SHOULDER SURGERY Right 2000   VENTRAL HERNIA REPAIR  09/30/2016   LAPROSCOPIC   VENTRAL HERNIA REPAIR N/A 09/30/2016   Procedure: LAPAROSCOPIC VENTRAL HERNIA;  Surgeon: Stark Klein, MD;  Location: [place];  Service: General;  Laterality: N/A;    MEDICATIONS:  aspirin EC 81 MG tablet   atenolol (TENORMIN)  50 MG tablet   atorvastatin (LIPITOR) 40 MG tablet   Emollient (CERAVE DAILY MOISTURIZING EX)   empagliflozin (JARDIANCE) 10 MG TABS tablet   fenofibrate 160 MG tablet   insulin aspart (NOVOLOG) 100 UNIT/ML injection   insulin glargine, 2 Unit Dial, (TOUJEO MAX SOLOSTAR) 300 UNIT/ML Solostar Pen   losartan (COZAAR) 25 MG tablet   metFORMIN (GLUCOPHAGE) 500 MG tablet   Misc Natural Products (GLUCOSAMINE CHONDROITIN MSM PO)   Multiple Vitamin (MULTIVITAMIN WITH MINERALS) TABS tablet   tetrahydrozoline 0.05 % ophthalmic solution   No current facility-administered medications for this encounter.     Konrad Felix Ward, PA-C [facility]Surgical [facility] [phone]

## 2021-09-18 ENCOUNTER — Ambulatory Visit (HOSPITAL_COMMUNITY): Admission: RE | Admit: 2021-09-18 | Payer: Medicare HMO | Source: Ambulatory Visit | Admitting: Orthopedic Surgery

## 2021-09-18 ENCOUNTER — Encounter (HOSPITAL_COMMUNITY): Admission: RE | Payer: Self-pay | Source: Ambulatory Visit

## 2021-09-18 SURGERY — ARTHROPLASTY, SHOULDER, TOTAL, REVERSE
Anesthesia: General | Site: Shoulder | Laterality: Right

## 2021-09-23 DIAGNOSIS — I10 Essential (primary) hypertension: Secondary | ICD-10-CM | POA: Diagnosis not present

## 2021-09-23 DIAGNOSIS — Z23 Encounter for immunization: Secondary | ICD-10-CM | POA: Diagnosis not present

## 2021-09-23 DIAGNOSIS — N1832 Chronic kidney disease, stage 3b: Secondary | ICD-10-CM | POA: Diagnosis not present

## 2021-09-23 DIAGNOSIS — Z794 Long term (current) use of insulin: Secondary | ICD-10-CM | POA: Diagnosis not present

## 2021-09-23 DIAGNOSIS — E1169 Type 2 diabetes mellitus with other specified complication: Secondary | ICD-10-CM | POA: Diagnosis not present

## 2021-10-07 ENCOUNTER — Other Ambulatory Visit: Payer: Self-pay

## 2021-10-07 ENCOUNTER — Ambulatory Visit (INDEPENDENT_AMBULATORY_CARE_PROVIDER_SITE_OTHER): Payer: Medicare HMO | Admitting: Ophthalmology

## 2021-10-07 ENCOUNTER — Encounter (INDEPENDENT_AMBULATORY_CARE_PROVIDER_SITE_OTHER): Payer: Self-pay | Admitting: Ophthalmology

## 2021-10-07 DIAGNOSIS — H43811 Vitreous degeneration, right eye: Secondary | ICD-10-CM

## 2021-10-07 DIAGNOSIS — H35342 Macular cyst, hole, or pseudohole, left eye: Secondary | ICD-10-CM | POA: Diagnosis not present

## 2021-10-07 DIAGNOSIS — H35351 Cystoid macular degeneration, right eye: Secondary | ICD-10-CM | POA: Diagnosis not present

## 2021-10-07 DIAGNOSIS — H2511 Age-related nuclear cataract, right eye: Secondary | ICD-10-CM

## 2021-10-07 DIAGNOSIS — R0683 Snoring: Secondary | ICD-10-CM

## 2021-10-07 NOTE — Assessment & Plan Note (Signed)
Preoperative visual acuity of 20/400 is now improved to 20/40 OS over the last 2 years

## 2021-10-07 NOTE — Assessment & Plan Note (Signed)
Physiologic PVD thus not likely to form macular hole right eye

## 2021-10-07 NOTE — Progress Notes (Signed)
10/07/2021     CHIEF COMPLAINT Patient presents for  Chief Complaint  Patient presents with   Retina Follow Up      HISTORY OF PRESENT ILLNESS: Maurice Perkins is a 75 y.o. male who presents to the clinic today for:   HPI     Retina Follow Up   Patient presents with  PVD.  In both eyes.  This started 1 year ago.  Severity is moderate.  Duration of 1 year.  Since onset it is stable.  I, the attending physician,  performed the HPI with the patient and updated documentation appropriately.        Comments   1 yr fu OU oct. Patient states vision is stable and unchanged since last visit. Denies any new FOL. Pt states "occasionally I see a floater, in my left eye, it started about a week ago and I notice it comes and goes. It is noticeable when it comes. It looks kind of gold."       Last edited by Laurin Coder on 10/07/2021  8:44 AM.      Referring physician: Warden Fillers, MD [address] [place],  [place] [address]  HISTORICAL INFORMATION:   Selected notes from the MEDICAL RECORD NUMBER    Lab Results  Component Value Date   HGBA1C 8.6 (H) 09/05/2021     CURRENT MEDICATIONS: Current Outpatient Medications (Ophthalmic Drugs)  Medication Sig   tetrahydrozoline 0.05 % ophthalmic solution Place 1-3 drops into both eyes 3 (three) times daily as needed (for itchy/scratchy eyes).   No current facility-administered medications for this visit. (Ophthalmic Drugs)   Current Outpatient Medications (Other)  Medication Sig   aspirin EC 81 MG tablet Take 81 mg by mouth daily.   atenolol (TENORMIN) 50 MG tablet Take 25 mg by mouth every morning.    atorvastatin (LIPITOR) 40 MG tablet Take 40 mg by mouth daily.   Emollient (CERAVE DAILY MOISTURIZING EX) Apply 1 application topically daily as needed (dry skin).   empagliflozin (JARDIANCE) 10 MG TABS tablet Take 10 mg by mouth daily.   fenofibrate 160 MG tablet Take 160 mg by mouth daily.   insulin aspart  (NOVOLOG) 100 UNIT/ML injection Inject 25 Units into the skin 3 (three) times daily with meals.   insulin glargine, 2 Unit Dial, (TOUJEO MAX SOLOSTAR) 300 UNIT/ML Solostar Pen Inject 70 Units into the skin daily.   losartan (COZAAR) 25 MG tablet Take 25 mg by mouth daily.   metFORMIN (GLUCOPHAGE) 500 MG tablet Take 1,000 mg by mouth 2 (two) times daily with a meal. 1000 mg at noon and 500 mg in the evening   Misc Natural Products (GLUCOSAMINE CHONDROITIN MSM PO) Take 2 tablets by mouth daily.   Multiple Vitamin (MULTIVITAMIN WITH MINERALS) TABS tablet Take 1 tablet by mouth daily.   No current facility-administered medications for this visit. (Other)      REVIEW OF SYSTEMS:    ALLERGIES Allergies  Allergen Reactions   Oxycodone Nausea Only   Penicillins Hives    Has patient had a PCN reaction causing immediate rash, facial/tongue/throat swelling, SOB or lightheadedness with hypotension:Yes Has patient had a PCN reaction causing severe rash involving mucus membranes or skin necrosis:unsure Has patient had a PCN reaction that required hospitalization:no Has patient had a PCN reaction occurring within the last 10 years:no If all of the above answers are "NO", then may proceed with Cephalosporin use.    Hydrocodone Nausea Only   Levofloxacin  Other reaction(s): tingling all over the body    PAST MEDICAL HISTORY Past Medical History:  Diagnosis Date   Diabetes ([place])    insulin requiring type 2.    Dry skin    pt has skin oil deficiency ischtiosis   Hyperlipidemia    Hypertension    Other general symptoms(780.99)    Pancreatic mass 2015   cystic mucinous neoplasm. opted for non-surgical observation. follwed with serial MRI/MRCP.     Past Surgical History:  Procedure Laterality Date   ABDOMINAL EXPLORATION SURGERY  09/25/2015   COLONOSCOPY     2004 dr Earlean Shawl   COLONOSCOPY N/A 11/20/2017   Procedure: COLONOSCOPY;  Surgeon: Milus Banister, MD;  Location[facility] ENDOSCOPY;   Service: Endoscopy;  Laterality: N/A;   EUS N/A 05/17/2014   Procedure: UPPER ENDOSCOPIC ULTRASOUND (EUS) LINEAR;  Surgeon: Milus Banister, MD;  Location: [facility] ENDOSCOPY;  Service: Endoscopy;  Laterality: N/A;   INSERTION OF MESH N/A 09/30/2016   Procedure: INSERTION OF MESH;  Surgeon: Stark Klein, MD;  Location: [facility];  Service: General;  Laterality: N/A;   LAPAROTOMY N/A 09/25/2015   Procedure: EXPLORATORY LAPAROTOMY small bowel resection;  Surgeon: Stark Klein, MD;  Location: [facility] OR;  Service: General;  Laterality: N/A;   NASAL SINUS SURGERY  1970's   pilonodal cyst     in high school-bottom of spine   SHOULDER SURGERY Right 2000   VENTRAL HERNIA REPAIR  09/30/2016   LAPROSCOPIC   VENTRAL HERNIA REPAIR N/A 09/30/2016   Procedure: LAPAROSCOPIC VENTRAL HERNIA;  Surgeon: Stark Klein, MD;  Location: [facility] OR;  Service: General;  Laterality: N/A;    FAMILY HISTORY Family History  Problem Relation Age of Onset   Heart disease Mother    Breast cancer Mother        breast   Colon cancer Neg Hx    Stomach cancer Neg Hx    Ulcerative colitis Neg Hx     SOCIAL HISTORY Social History   Tobacco Use   Smoking status: Former    Packs/day: 2.00    Years: 15.00    Pack years: 30.00    Types: Cigarettes    Quit date: 12/15/1971    Years since quitting: 49.8   Smokeless tobacco: Never  Vaping Use   Vaping Use: Never used  Substance Use Topics   Alcohol use: No    Alcohol/week: 0.0 standard drinks   Drug use: No         OPHTHALMIC EXAM:  Base Eye Exam     Visual Acuity (ETDRS)       Right Left   Dist cc 20/25 -2 20/40 -2   Dist ph cc  NI    Correction: Glasses         Tonometry (Tonopen, 8:49 AM)       Right Left   Pressure 10 9         Pupils       Pupils Dark Light Shape React APD   Right PERRL 3 2.5 Round Minimal None   Left PERRL 2.5 2 Round Minimal None         Visual Fields (Counting fingers)       Left Right    Full Full         Extraocular  Movement       Right Left    Full Full         Neuro/Psych     Oriented x3: Yes   Mood/Affect: Normal  Dilation     Both eyes: 1.0% Mydriacyl, 2.5% Phenylephrine @ 8:49 AM           Slit Lamp and Fundus Exam     External Exam       Right Left   External Normal Normal         Slit Lamp Exam       Right Left   Lids/Lashes Normal Normal   Conjunctiva/Sclera White and quiet White and quiet   Cornea Clear Clear   Anterior Chamber Deep and quiet Deep and quiet   Iris Round and reactive Round and reactive   Lens Clear Posterior chamber intraocular lens   Anterior Vitreous Normal Normal         Fundus Exam       Right Left   Posterior Vitreous Normal Clear and vitrectomized   Disc Normal Normal   C/D Ratio 0.3 0.4   Macula Normal Hole closed, normal anatomy, negative Watzke   Vessels Normal Normal   Periphery Normal Normal           Refraction     Wearing Rx     Age: 82 yrs            IMAGING AND PROCEDURES  Imaging and Procedures for 10/07/21           ASSESSMENT/PLAN:  Macular hole of left eye Preoperative visual acuity of 20/400 is now improved to 20/40 OS over the last 2 years  Nuclear sclerotic cataract of right eye Mild NSC progression OD with good acuity  Posterior vitreous detachment of right eye Physiologic PVD thus not likely to form macular hole right eye  Cystoid macular degeneration of right eye Perifoveal micro CME on the high-definition OCT slightly above the center of the fovea with overall increase in thickness.  This is not definitive for MAC-TEL or perivascular leakage in the pseudophakic right eye yet to could suggest MAC-TEL  Patient has only occasional snoring and negative review of systems for sleep apnea     ICD-10-CM   1. Posterior vitreous detachment of right eye  H43.811 OCT, Retina - OU - Both Eyes    2. Macular hole of left eye  H35.342     3. Nuclear sclerotic cataract of right eye   H25.11     4. Cystoid macular degeneration of right eye  H35.351       1.  OS with vast improvement in macular hole anatomically as well as visually from preoperative 20/400 now to 20/40  2.  OD no signs of macular hole  3.  Mild to moderate NSC changes OD with good acuity  4.  The minor perifoveal CME is seen only on OCT superior to FAZ yet with increased macular thickening, I did ask about the potential for nightly hypoxic stress from sleep apnea.  Review of systems is marginal at best.  I have asked the patient to remain vigilant should symptoms of sleep apnea began he may seek testing with his PCP physician  Ophthalmic Meds Ordered this visit:  No orders of the defined types were placed in this encounter.      Return in about 1 year (around 10/07/2022) for DILATE OU, COLOR FP, OCT.  There are no Patient Instructions on file for this visit.   Explained the diagnoses, plan, and follow up with the patient and they expressed understanding.  Patient expressed understanding of the importance of proper follow up care.   Clent Demark Tempest Frankland M.D. Diseases &  Surgery of the Retina and Vitreous Retina & Diabetic [place] 10/07/21     Abbreviations: M myopia (nearsighted); A astigmatism; H hyperopia (farsighted); P presbyopia; Mrx spectacle prescription;  CTL contact lenses; OD right eye; OS left eye; OU both eyes  XT exotropia; ET esotropia; PEK punctate epithelial keratitis; PEE punctate epithelial erosions; DES dry eye syndrome; MGD meibomian gland dysfunction; ATs artificial tears; PFAT's preservative free artificial tears; [place] nuclear sclerotic cataract; PSC posterior subcapsular cataract; ERM epi-retinal membrane; PVD posterior vitreous detachment; RD retinal detachment; DM diabetes mellitus; DR diabetic retinopathy; NPDR non-proliferative diabetic retinopathy; PDR proliferative diabetic retinopathy; CSME clinically significant macular edema; DME diabetic macular edema; dbh dot blot  hemorrhages; CWS cotton wool spot; POAG primary open angle glaucoma; C/D cup-to-disc ratio; HVF humphrey visual field; GVF goldmann visual field; OCT optical coherence tomography; IOP intraocular pressure; BRVO Branch retinal vein occlusion; CRVO central retinal vein occlusion; CRAO central retinal artery occlusion; BRAO branch retinal artery occlusion; RT retinal tear; SB scleral buckle; PPV pars plana vitrectomy; VH Vitreous hemorrhage; PRP panretinal laser photocoagulation; IVK intravitreal kenalog; VMT vitreomacular traction; MH Macular hole;  NVD neovascularization of the disc; NVE neovascularization elsewhere; AREDS age related eye disease study; ARMD age related macular degeneration; POAG primary open angle glaucoma; EBMD epithelial/anterior basement membrane dystrophy; ACIOL anterior chamber intraocular lens; IOL intraocular lens; PCIOL posterior chamber intraocular lens; Phaco/IOL phacoemulsification with intraocular lens placement; [place] photorefractive keratectomy; LASIK laser assisted in situ keratomileusis; HTN hypertension; DM diabetes mellitus; COPD chronic obstructive pulmonary disease

## 2021-10-07 NOTE — Assessment & Plan Note (Signed)
Of asked patient to remain vigilant regarding.  Patient's wife's report about snoring but also nocturia and that if he thinks he might have sleep apnea seek out assistance with his PCP

## 2021-10-07 NOTE — Assessment & Plan Note (Signed)
Perifoveal micro CME on the high-definition OCT slightly above the center of the fovea with overall increase in thickness.  This is not definitive for MAC-TEL or perivascular leakage in the pseudophakic right eye yet to could suggest MAC-TEL  Patient has only occasional snoring and negative review of systems for sleep apnea

## 2021-10-07 NOTE — Assessment & Plan Note (Signed)
Mild NSC progression OD with good acuity

## 2021-10-23 DIAGNOSIS — R519 Headache, unspecified: Secondary | ICD-10-CM | POA: Diagnosis not present

## 2021-10-23 DIAGNOSIS — J029 Acute pharyngitis, unspecified: Secondary | ICD-10-CM | POA: Diagnosis not present

## 2021-10-23 DIAGNOSIS — R5383 Other fatigue: Secondary | ICD-10-CM | POA: Diagnosis not present

## 2021-10-23 DIAGNOSIS — R058 Other specified cough: Secondary | ICD-10-CM | POA: Diagnosis not present

## 2021-10-23 DIAGNOSIS — J019 Acute sinusitis, unspecified: Secondary | ICD-10-CM | POA: Diagnosis not present

## 2021-10-23 DIAGNOSIS — Z1152 Encounter for screening for COVID-19: Secondary | ICD-10-CM | POA: Diagnosis not present

## 2021-11-09 DIAGNOSIS — J069 Acute upper respiratory infection, unspecified: Secondary | ICD-10-CM | POA: Diagnosis not present

## 2021-11-09 DIAGNOSIS — R059 Cough, unspecified: Secondary | ICD-10-CM | POA: Diagnosis not present

## 2021-11-20 DIAGNOSIS — Z794 Long term (current) use of insulin: Secondary | ICD-10-CM | POA: Diagnosis not present

## 2021-11-20 DIAGNOSIS — E1169 Type 2 diabetes mellitus with other specified complication: Secondary | ICD-10-CM | POA: Diagnosis not present

## 2021-11-20 DIAGNOSIS — N1832 Chronic kidney disease, stage 3b: Secondary | ICD-10-CM | POA: Diagnosis not present

## 2021-11-20 DIAGNOSIS — I10 Essential (primary) hypertension: Secondary | ICD-10-CM | POA: Diagnosis not present

## 2021-11-20 DIAGNOSIS — E785 Hyperlipidemia, unspecified: Secondary | ICD-10-CM | POA: Diagnosis not present

## 2022-02-24 DIAGNOSIS — E785 Hyperlipidemia, unspecified: Secondary | ICD-10-CM | POA: Diagnosis not present

## 2022-02-24 DIAGNOSIS — I10 Essential (primary) hypertension: Secondary | ICD-10-CM | POA: Diagnosis not present

## 2022-02-24 DIAGNOSIS — E1169 Type 2 diabetes mellitus with other specified complication: Secondary | ICD-10-CM | POA: Diagnosis not present

## 2022-02-24 DIAGNOSIS — R946 Abnormal results of thyroid function studies: Secondary | ICD-10-CM | POA: Diagnosis not present

## 2022-02-24 DIAGNOSIS — E538 Deficiency of other specified B group vitamins: Secondary | ICD-10-CM | POA: Diagnosis not present

## 2022-02-24 DIAGNOSIS — Z125 Encounter for screening for malignant neoplasm of prostate: Secondary | ICD-10-CM | POA: Diagnosis not present

## 2022-03-02 DIAGNOSIS — E781 Pure hyperglyceridemia: Secondary | ICD-10-CM | POA: Diagnosis not present

## 2022-03-02 DIAGNOSIS — I10 Essential (primary) hypertension: Secondary | ICD-10-CM | POA: Diagnosis not present

## 2022-03-02 DIAGNOSIS — N401 Enlarged prostate with lower urinary tract symptoms: Secondary | ICD-10-CM | POA: Diagnosis not present

## 2022-03-02 DIAGNOSIS — N1832 Chronic kidney disease, stage 3b: Secondary | ICD-10-CM | POA: Diagnosis not present

## 2022-03-02 DIAGNOSIS — E1169 Type 2 diabetes mellitus with other specified complication: Secondary | ICD-10-CM | POA: Diagnosis not present

## 2022-03-02 DIAGNOSIS — M25519 Pain in unspecified shoulder: Secondary | ICD-10-CM | POA: Diagnosis not present

## 2022-03-02 DIAGNOSIS — R82998 Other abnormal findings in urine: Secondary | ICD-10-CM | POA: Diagnosis not present

## 2022-03-02 DIAGNOSIS — Z0000 Encounter for general adult medical examination without abnormal findings: Secondary | ICD-10-CM | POA: Diagnosis not present

## 2022-03-02 DIAGNOSIS — E785 Hyperlipidemia, unspecified: Secondary | ICD-10-CM | POA: Diagnosis not present

## 2022-03-02 DIAGNOSIS — R946 Abnormal results of thyroid function studies: Secondary | ICD-10-CM | POA: Diagnosis not present

## 2022-03-05 DIAGNOSIS — L821 Other seborrheic keratosis: Secondary | ICD-10-CM | POA: Diagnosis not present

## 2022-03-05 DIAGNOSIS — Z85828 Personal history of other malignant neoplasm of skin: Secondary | ICD-10-CM | POA: Diagnosis not present

## 2022-03-05 DIAGNOSIS — L570 Actinic keratosis: Secondary | ICD-10-CM | POA: Diagnosis not present

## 2022-03-05 DIAGNOSIS — L814 Other melanin hyperpigmentation: Secondary | ICD-10-CM | POA: Diagnosis not present

## 2022-03-05 DIAGNOSIS — D485 Neoplasm of uncertain behavior of skin: Secondary | ICD-10-CM | POA: Diagnosis not present

## 2022-03-19 NOTE — Patient Instructions (Addendum)
DUE TO COVID-19 ONLY ONE VISITOR  (aged 78 and older)  IS ALLOWED TO COME WITH YOU AND STAY IN THE WAITING ROOM ONLY DURING PRE OP AND PROCEDURE.   ?**NO VISITORS ARE ALLOWED IN THE SHORT STAY AREA OR RECOVERY ROOM!!** ? ? Your procedure is scheduled on: 04/02/22 ? ? Report to [facility] Main Entrance ? ?  Report to admitting at 5:15 AM ? ? Call this number if you have problems the morning of surgery [phone] ? ? Do not eat food :After Midnight. ? ? After Midnight you may have the following liquids until 4:30 AM DAY OF SURGERY ? ?Water ?Black Coffee (sugar ok, NO MILK/CREAM OR CREAMERS)  ?Tea (sugar ok, NO MILK/CREAM OR CREAMERS) regular and decaf                             ?Plain Jell-O (NO RED)                                           ?Fruit ices (not with fruit pulp, NO RED)                                     ?Popsicles (NO RED)                                                                  ?Juice: apple, WHITE grape, WHITE cranberry ?Sports drinks like Gatorade (NO RED) ?Clear broth(vegetable,chicken,beef) ? ?              ?The day of surgery:  ?Drink ONE (1) Pre-Surgery G2 at 4:30 AM the morning of surgery. Drink in one sitting. Do not sip.  ?This drink was given to you during your hospital  ?pre-op appointment visit. ?Nothing else to drink after completing the Pre-Surgery G2. ?  ?       If you have questions, please contact your surgeon?s office. ? ? ?FOLLOW ANY ADDITIONAL PRE OP INSTRUCTIONS YOU RECEIVED FROM YOUR SURGEON'S OFFICE!!! ?  ?  ?Oral Hygiene is also important to reduce your risk of infection.                                    ?Remember - BRUSH YOUR TEETH THE MORNING OF SURGERY WITH YOUR REGULAR TOOTHPASTE ? ? Do NOT smoke after Midnight ? ? Take these medicines the morning of surgery with A SIP OF WATER: Inhalers, Atenolol, Atorvastatin, Fenofibrate.  ? ?DO NOT TAKE ANY ORAL DIABETIC MEDICATIONS DAY OF YOUR SURGERY ? ?How to Manage Your Diabetes ?Before and After Surgery ? ?Why  is it important to control my blood sugar before and after surgery? ?Improving blood sugar levels before and after surgery helps healing and can limit problems. ?A way of improving blood sugar control is eating a healthy diet by: ? Eating less sugar and carbohydrates ? Increasing activity/exercise ? Talking with your doctor about reaching your blood sugar goals ?High blood sugars (greater  than 180 mg/dL) can raise your risk of infections and slow your recovery, so you will need to focus on controlling your diabetes during the weeks before surgery. ?Make sure that the doctor who takes care of your diabetes knows about your planned surgery including the date and location. ? ?How do I manage my blood sugar before surgery? ?Check your blood sugar at least 4 times a day, starting 2 days before surgery, to make sure that the level is not too high or low. ?Check your blood sugar the morning of your surgery when you wake up and every 2 hours until you get to the Short Stay unit. ?If your blood sugar is less than 70 mg/dL, you will need to treat for low blood sugar: ?Do not take insulin. ?Treat a low blood sugar (less than 70 mg/dL) with ? cup of clear juice (cranberry or apple), 4 glucose tablets, OR glucose gel. ?Recheck blood sugar in 15 minutes after treatment (to make sure it is greater than 70 mg/dL). If your blood sugar is not greater than 70 mg/dL on recheck, call [phone] for further instructions. ?Report your blood sugar to the short stay nurse when you get to Short Stay. ? ?If you are admitted to the hospital after surgery: ?Your blood sugar will be checked by the staff and you will probably be given insulin after surgery (instead of oral diabetes medicines) to make sure you have good blood sugar levels. ?The goal for blood sugar control after surgery is 80-180 mg/dL. ? ? ?WHAT DO I DO ABOUT MY DIABETES MEDICATION? ? ?Do not take oral diabetes medicines (pills) the morning of surgery. ? ?THE DAY BEFORE  SURGERY:   Do not take Jardiance ?                            Take Metformin as prescribed ?       Take Toujeo as prescribed ?       Novolog - take as prescribed (no bedtime dose)       ? ? ?THE MORNING OF SURGERY:  Do not take Metformin or Jardiance ? Take 40 units of Toujeo. ? Take 50% of Novolog if CBG 220 or higher . ? ?Reviewed and Endorsed by [facility] Patient Education Committee, August 2015  ?                  ?           You may not have any metal on your body including jewelry, and body piercing ? ?           Do not wear lotions, powders, cologne, or deodorant ? ?            Men may shave face and neck. ? ? Do not bring valuables to the hospital. [place]. ? ? Contacts, dentures or bridgework may not be worn into surgery.  ? ?Patients discharged on the day of surgery will not be allowed to drive home.  Someone NEEDS to stay with you for the first 24 hours after anesthesia. ? ?Special Instructions: Bring a copy of your healthcare power of attorney and living will documents         the day of surgery if you haven't scanned them before. ? ?Please read over the following fact sheets you were given: IF [place] ? ?  [place] - Preparing for Surgery ?Before surgery, you can play an important role.  Because skin is not sterile, your skin needs to be as free of germs as possible.  You can reduce the number of germs on your skin by washing with CHG (chlorahexidine gluconate) soap before surgery.  CHG is an antiseptic cleaner which kills germs and bonds with the skin to continue killing germs even after washing. ?Please DO NOT use if you have an allergy to CHG or antibacterial soaps.  If your skin becomes reddened/irritated stop using the CHG and inform your nurse when you arrive at Short Stay. ?Do not shave (including legs and underarms) for at least 48 hours prior to the first CHG shower.  You may  shave your face/neck. ? ?Please follow these instructions carefully: ? 1.  Shower with CHG Soap the night before surgery and the  morning of surgery. ? 2.  If you choose to wash your hair, wash your hair first as usual with your normal  shampoo. ? 3.  After you shampoo, rinse your hair and body thoroughly to remove the shampoo.                            ? 4.  Use CHG as you would any other liquid soap.  You can apply chg directly to the skin and wash.  Gently with a scrungie or clean washcloth. ? 5.  Apply the CHG Soap to your body ONLY FROM THE NECK DOWN.   Do   not use on face/ open      ?                     Wound or open sores. Avoid contact with eyes, ears mouth and   genitals (private parts).  ?                     Production manager,  Genitals (private parts) with your normal soap. ?            6.  Wash thoroughly, paying special attention to the area where your    surgery  will be performed. ? 7.  Thoroughly rinse your body with warm water from the neck down. ? 8.  DO NOT shower/wash with your normal soap after using and rinsing off the CHG Soap. ?               9.  Pat yourself dry with a clean towel. ?           10.  Wear clean pajamas. ?           11.  Place clean sheets on your bed the night of your first shower and do not  sleep with pets. ?Day of Surgery : ?Do not apply any lotions/deodorants the morning of surgery.  Please wear clean clothes to the hospital/surgery center. ? ?FAILURE TO FOLLOW THESE INSTRUCTIONS MAY RESULT IN THE CANCELLATION OF YOUR SURGERY ? ?PATIENT SIGNATURE_________________________________ ? ?NURSE SIGNATURE__________________________________ ? ?________________________________________________________________________  ? ?Incentive Spirometer ? ?An incentive spirometer is a tool that can help keep your lungs clear and active. This tool measures how well you are filling your lungs with each breath. Taking long deep breaths may help reverse or decrease the chance of developing breathing  (pulmonary) problems (especially infection) following: ?A long period of time when you are unable to move or be active. ?BEFORE THE PROCEDURE  ?If  the spirometer includes an indicator to show your best effort, you

## 2022-03-19 NOTE — Progress Notes (Signed)
COVID Vaccine Completed: yes x2 ?Date COVID Vaccine completed: 12/28/19, 01/16/20 ?Has received booster: ?COVID vaccine manufacturer: Pfizer     ? ?Date of COVID positive in last 90 days: ? ?PCP - Sueanne Margarita, DO ?Cardiologist -  ? ?Chest x-ray -  ?EKG - 09/05/21 Epic ?Stress Test - 08/07/11 Epic ?ECHO -  ?Cardiac Cath -  ?Pacemaker/ICD device last checked: ?Spinal Cord Stimulator: ? ?Bowel Prep -  ? ?Sleep Study -  ?CPAP -  ? ?Fasting Blood Sugar -  ?Checks Blood Sugar _____ times a day ? ?Blood Thinner Instructions: ?Aspirin Instructions: ASA 81 ?Last Dose: ? ?Activity level:  Can go up a flight of stairs and perform activities of daily living without stopping and without symptoms of chest pain or shortness of breath. ?  Able to exercise without symptoms ? ?Unable to go up a flight of stairs without symptoms of  ?   ? ?Anesthesia review: HTN, DM2, CKD, anemia ? ?Patient denies shortness of breath, fever, cough and chest pain at PAT appointment ? ? ?Patient verbalized understanding of instructions that were given to them at the PAT appointment. Patient was also instructed that they will need to review over the PAT instructions again at home before surgery.  ?

## 2022-03-23 ENCOUNTER — Encounter (HOSPITAL_COMMUNITY)
Admission: RE | Admit: 2022-03-23 | Discharge: 2022-03-23 | Disposition: A | Discharge status: Home / Self Care | Payer: Medicare HMO | Source: Ambulatory Visit | Attending: Orthopedic Surgery | Admitting: Orthopedic Surgery

## 2022-03-23 ENCOUNTER — Other Ambulatory Visit: Payer: Self-pay

## 2022-03-23 ENCOUNTER — Encounter (HOSPITAL_COMMUNITY): Payer: Self-pay

## 2022-03-23 VITALS — BP 134/78 | HR 67 | Temp 98.9°F | Resp 18 | Ht 75.0 in | Wt 211.6 lb

## 2022-03-23 DIAGNOSIS — E119 Type 2 diabetes mellitus without complications: Secondary | ICD-10-CM | POA: Insufficient documentation

## 2022-03-23 DIAGNOSIS — Z01818 Encounter for other preprocedural examination: Secondary | ICD-10-CM

## 2022-03-23 DIAGNOSIS — Z01812 Encounter for preprocedural laboratory examination: Secondary | ICD-10-CM | POA: Diagnosis not present

## 2022-03-23 DIAGNOSIS — Z794 Long term (current) use of insulin: Secondary | ICD-10-CM | POA: Insufficient documentation

## 2022-03-23 DIAGNOSIS — I2510 Atherosclerotic heart disease of native coronary artery without angina pectoris: Secondary | ICD-10-CM | POA: Insufficient documentation

## 2022-03-23 HISTORY — DX: Chronic kidney disease, stage 3 unspecified: N18.30

## 2022-03-23 LAB — CBC
HCT: 49.8 % (ref 39.0–52.0)
Hemoglobin: 16.3 g/dL (ref 13.0–17.0)
MCH: 29.7 pg (ref 26.0–34.0)
MCHC: 32.7 g/dL (ref 30.0–36.0)
MCV: 90.9 fL (ref 80.0–100.0)
Platelets: 257 10*3/uL (ref 150–400)
RBC: 5.48 MIL/uL (ref 4.22–5.81)
RDW: 14.5 % (ref 11.5–15.5)
WBC: 7.6 10*3/uL (ref 4.0–10.5)
nRBC: 0 % (ref 0.0–0.2)

## 2022-03-23 LAB — BASIC METABOLIC PANEL
Anion gap: 8 (ref 5–15)
BUN: 36 mg/dL — ABNORMAL HIGH (ref 8–23)
CO2: 27 mmol/L (ref 22–32)
Calcium: 9.4 mg/dL (ref 8.9–10.3)
Chloride: 108 mmol/L (ref 98–111)
Creatinine, Ser: 1.87 mg/dL — ABNORMAL HIGH (ref 0.61–1.24)
GFR, Estimated: 37 mL/min — ABNORMAL LOW (ref >60)
Glucose, Bld: 88 mg/dL (ref 70–99)
Potassium: 4.5 mmol/L (ref 3.5–5.1)
Sodium: 143 mmol/L (ref 135–145)

## 2022-03-23 LAB — SURGICAL PCR SCREEN
MRSA, PCR: POSITIVE — AB
Staphylococcus aureus: POSITIVE — AB

## 2022-03-23 LAB — HEMOGLOBIN A1C
Hgb A1c MFr Bld: 8 % — ABNORMAL HIGH (ref 4.8–5.6)
Mean Plasma Glucose: 182.9 mg/dL

## 2022-03-23 LAB — GLUCOSE, CAPILLARY: Glucose-Capillary: 97 mg/dL (ref 70–99)

## 2022-03-23 NOTE — Progress Notes (Addendum)
COVID Vaccine Completed: yes x2 ?Date COVID Vaccine completed: 12/28/19, 01/16/20 ?Has received booster:  Yes x1 ?COVID vaccine manufacturer: Pfizer     ?  ?Date of COVID positive in last 90 days:  No ?  ?PCP - Sueanne Margarita, DO ?Cardiologist - Glori Bickers, MD in 2011 for stress test, no abnormalities.  No follow up needed ?  ?Chest x-ray - N/A ?EKG - 09/05/21 Epic ?Stress Test - 08/07/11 Epic ?ECHO - N/A ?Cardiac Cath - N/A ?Pacemaker/ICD device last checked: ?Spinal Cord Stimulator: ?  ?Bowel Prep - N/A ?  ?Sleep Study - N/A ?CPAP -  ?  ?Fasting Blood Sugar - 120 range ?Checks Blood Sugar - 1 time a day ?  ?Blood Thinner Instructions: ?Aspirin Instructions: ASA 81.  Patient to check to see if he needs to stop ?Last Dose: ?  ?Activity level:   Can go up a flight of stairs and perform activities of daily living without stopping and without symptoms of chest pain or shortness of breath.   Able to exercise without symptoms                    ?  ?Anesthesia review: A1c 8.0 on PAT labs, BUN 36 and creatinine 1.87 hx of CKD. HTN, DM   ?  ?Patient denies shortness of breath, fever, cough and chest pain at PAT appointment ?  ?  ?Patient verbalized understanding of instructions that were given to them at the PAT appointment. Patient was also instructed that they will need to review over the PAT instructions again at home before surgery.  ?

## 2022-03-24 NOTE — Progress Notes (Signed)
PCR results sent to Dr. Supple to review. 

## 2022-03-26 ENCOUNTER — Encounter (HOSPITAL_COMMUNITY): Payer: Self-pay

## 2022-03-26 NOTE — Anesthesia Preprocedure Evaluation (Addendum)
Anesthesia Evaluation  ?Patient identified by MRN, date of birth, ID band ?Patient awake ? ? ? ?Reviewed: ?Allergy & Precautions, NPO status , Patient's Chart, lab work & pertinent test results ? ?Airway ?Mallampati: II ? ?TM Distance: >3 FB ?Neck ROM: Full ? ? ? Dental ?no notable dental hx. ? ?  ?Pulmonary ?neg pulmonary ROS, former smoker,  ?  ?Pulmonary exam normal ?breath sounds clear to auscultation ? ? ? ? ? ? Cardiovascular ?hypertension, Pt. on medications ?+ CAD  ?Normal cardiovascular exam ?Rhythm:Regular Rate:Normal ? ? ?  ?Neuro/Psych ?negative neurological ROS ? negative psych ROS  ? GI/Hepatic ?negative GI ROS, Neg liver ROS,   ?Endo/Other  ?negative endocrine ROSdiabetes, Type 2 ? Renal/GU ?Renal InsufficiencyRenal disease  ?negative genitourinary ?  ?Musculoskeletal ?negative musculoskeletal ROS ?(+)  ? Abdominal ?  ?Peds ?negative pediatric ROS ?(+)  Hematology ?negative hematology ROS ?(+)   ?Anesthesia Other Findings ? ? Reproductive/Obstetrics ?negative OB ROS ? ?  ? ? ? ? ? ? ? ? ? ? ? ? ? ?  ?  ? ? ? ? ? ? ? ?Anesthesia Physical ?Anesthesia Plan ? ?ASA: 3 ? ?Anesthesia Plan: General  ? ?Post-op Pain Management: Regional block* and Minimal or no pain anticipated  ? ?Induction: Intravenous ? ?PONV Risk Score and Plan: 2 and Ondansetron, Midazolam and Treatment may vary due to age or medical condition ? ?Airway Management Planned: Oral ETT ? ?Additional Equipment:  ? ?Intra-op Plan:  ? ?Post-operative Plan: Extubation in OR ? ?Informed Consent: I have reviewed the patients History and Physical, chart, labs and discussed the procedure including the risks, benefits and alternatives for the proposed anesthesia with the patient or authorized representative who has indicated his/her understanding and acceptance.  ? ? ? ?Dental advisory given ? ?Plan Discussed with: CRNA ? ?Anesthesia Plan Comments: (See APP note by Durel Salts, FNP )  ? ? ? ? ? ?Anesthesia Quick  Evaluation ? ?

## 2022-03-26 NOTE — Progress Notes (Signed)
Anesthesia Chart Review: ? ? Case: [id number] Date/Time: 04/02/22 0715  ? Procedure: REVERSE SHOULDER ARTHROPLASTY (Right: Shoulder)  ? Anesthesia type: General  ? Pre-op diagnosis: Right shoulder rotator cuff tear arthropathy  ? Location[facility] ROOM 06 / [facility]  ? Surgeons: Justice Britain, MD  ? ?  ? ? ?DISCUSSION: ?Pt is 76 years old with hx HTN, DM, CKD ? ?VS: BP 134/78   Pulse 67   Temp 37.2 ?C (Oral)   Resp 18   Ht '6\' 3"'$  (1.905 m)   Wt 96 kg   SpO2 99%   BMI 26.45 kg/m?  ? ?PROVIDERS: ?- PCP is Sueanne Margarita, DO ? ? ?LABS: Labs reviewed: Acceptable for surgery. ?- Cr 1.87, stable compared with prior results  ?- A1c 8.0. Was 8.6 last fall. Per PCP notes, was 7.6 on 02/24/22 and Dr. Francesco Sor thinks A1c is falsely elevated and a1c should not delay surgery ? ? ?(all labs ordered are listed, but only abnormal results are displayed) ? ?Labs Reviewed  ?SURGICAL PCR SCREEN - Abnormal; Notable for the following components:  ?    Result Value  ? MRSA, PCR POSITIVE (*)   ? Staphylococcus aureus POSITIVE (*)   ? All other components within normal limits  ?HEMOGLOBIN A1C - Abnormal; Notable for the following components:  ? Hgb A1c MFr Bld 8.0 (*)   ? All other components within normal limits  ?BASIC METABOLIC PANEL - Abnormal; Notable for the following components:  ? BUN 36 (*)   ? Creatinine, Ser 1.87 (*)   ? GFR, Estimated 37 (*)   ? All other components within normal limits  ?CBC  ?GLUCOSE, CAPILLARY  ? ? ? ?EKG 923/22: Sinus rhythm with 1st degree A-V block ? ? ?CV: None recent. Stress test in 2012 ? ?Past Medical History:  ?Diagnosis Date  ? CKD (chronic kidney disease) stage 3, GFR 30-59 ml/min (HCC)   ? Diabetes ([place])   ? insulin requiring type 2.   ? Dry skin   ? pt has skin oil deficiency ischtiosis  ? Hyperlipidemia   ? Hypertension   ? Other general symptoms(780.99)   ? Pancreatic mass 2015  ? cystic mucinous neoplasm. opted for non-surgical observation. follwed with serial MRI/MRCP.    ? ? ?Past Surgical History:   ?Procedure Laterality Date  ? ABDOMINAL EXPLORATION SURGERY  09/25/2015  ? CATARACT EXTRACTION W/ INTRAOCULAR LENS IMPLANT Left   ? COLONOSCOPY    ? 2004 dr Earlean Shawl  ? COLONOSCOPY N/A 11/20/2017  ? Procedure: COLONOSCOPY;  Surgeon: Milus Banister, MD;  Location: [facility] ENDOSCOPY;  Service: Endoscopy;  Laterality: N/A;  ? EUS N/A 05/17/2014  ? Procedure: UPPER ENDOSCOPIC ULTRASOUND (EUS) LINEAR;  Surgeon: Milus Banister, MD;  [facility];  Service: Endoscopy;  Laterality: N/A;  ? EYE SURGERY Left   ? Retina repair  ? INSERTION OF MESH N/A 09/30/2016  ? Procedure: INSERTION OF MESH;  Surgeon: Stark Klein, MD;  Location: [place];  Service: General;  Laterality: N/A;  ? LAPAROTOMY N/A 09/25/2015  ? Procedure: EXPLORATORY LAPAROTOMY small bowel resection;  Surgeon: Stark Klein, MD;  Location: [place];  Service: General;  Laterality: N/A;  ? NASAL SINUS SURGERY  1970's  ? pilonodal cyst    ? in high school-bottom of spine  ? SHOULDER SURGERY Right 2000  ? VENTRAL HERNIA REPAIR  09/30/2016  ? LAPROSCOPIC  ? VENTRAL HERNIA REPAIR N/A 09/30/2016  ? Procedure: LAPAROSCOPIC VENTRAL HERNIA;  Surgeon: Stark Klein, MD;  [facility];  Service: General;  Laterality: N/A;  ? ? ?MEDICATIONS: ? albuterol (VENTOLIN HFA) 108 (90 Base) MCG/ACT inhaler  ? aspirin EC 81 MG tablet  ? atenolol (TENORMIN) 50 MG tablet  ? atorvastatin (LIPITOR) 40 MG tablet  ? Emollient (CERAVE DAILY MOISTURIZING EX)  ? empagliflozin (JARDIANCE) 10 MG TABS tablet  ? fenofibrate 160 MG tablet  ? fluticasone (FLONASE) 50 MCG/ACT nasal spray  ? insulin aspart (NOVOLOG) 100 UNIT/ML injection  ? insulin glargine, 2 Unit Dial, (TOUJEO MAX SOLOSTAR) 300 UNIT/ML Solostar Pen  ? losartan (COZAAR) 50 MG tablet  ? metFORMIN (GLUCOPHAGE-XR) 500 MG 24 hr tablet  ? Misc Natural Products (GLUCOSAMINE CHONDROITIN MSM PO)  ? Multiple Vitamin (MULTIVITAMIN WITH MINERALS) TABS tablet  ? nitroGLYCERIN (NITROSTAT) 0.4 MG SL tablet  ? tetrahydrozoline 0.05 % ophthalmic  solution  ? ?No current facility-administered medications for this encounter.  ? ? ?If no changes, I anticipate pt can proceed with surgery as scheduled.  ? ?Willeen Cass, PhD, FNP-BC ?[facility]/Anesthesiology ?Phone: [phone] ?03/26/2022 11:02 AM  ? ? ? ? ? ? ?

## 2022-04-02 ENCOUNTER — Encounter (HOSPITAL_COMMUNITY): Payer: Self-pay | Admitting: Orthopedic Surgery

## 2022-04-02 ENCOUNTER — Ambulatory Visit (HOSPITAL_COMMUNITY): Payer: Medicare HMO | Admitting: Emergency Medicine

## 2022-04-02 ENCOUNTER — Other Ambulatory Visit: Payer: Self-pay

## 2022-04-02 ENCOUNTER — Ambulatory Visit (HOSPITAL_BASED_OUTPATIENT_CLINIC_OR_DEPARTMENT_OTHER): Payer: Medicare HMO | Admitting: Certified Registered Nurse Anesthetist

## 2022-04-02 ENCOUNTER — Encounter (HOSPITAL_COMMUNITY)
Admission: RE | Disposition: A | Discharge status: Home / Self Care | Payer: Self-pay | Source: Ambulatory Visit | Attending: Orthopedic Surgery

## 2022-04-02 ENCOUNTER — Ambulatory Visit (HOSPITAL_COMMUNITY)
Admission: RE | Admit: 2022-04-02 | Discharge: 2022-04-02 | Disposition: A | Discharge status: Home / Self Care | Payer: Medicare HMO | Source: Ambulatory Visit | Attending: Orthopedic Surgery | Admitting: Orthopedic Surgery

## 2022-04-02 DIAGNOSIS — M12811 Other specific arthropathies, not elsewhere classified, right shoulder: Secondary | ICD-10-CM

## 2022-04-02 DIAGNOSIS — M25511 Pain in right shoulder: Secondary | ICD-10-CM | POA: Diagnosis not present

## 2022-04-02 DIAGNOSIS — Z794 Long term (current) use of insulin: Secondary | ICD-10-CM | POA: Insufficient documentation

## 2022-04-02 DIAGNOSIS — M25811 Other specified joint disorders, right shoulder: Secondary | ICD-10-CM | POA: Diagnosis not present

## 2022-04-02 DIAGNOSIS — M25711 Osteophyte, right shoulder: Secondary | ICD-10-CM | POA: Diagnosis not present

## 2022-04-02 DIAGNOSIS — E1122 Type 2 diabetes mellitus with diabetic chronic kidney disease: Secondary | ICD-10-CM | POA: Diagnosis not present

## 2022-04-02 DIAGNOSIS — I2510 Atherosclerotic heart disease of native coronary artery without angina pectoris: Secondary | ICD-10-CM | POA: Diagnosis not present

## 2022-04-02 DIAGNOSIS — I10 Essential (primary) hypertension: Secondary | ICD-10-CM | POA: Diagnosis not present

## 2022-04-02 DIAGNOSIS — Z87891 Personal history of nicotine dependence: Secondary | ICD-10-CM | POA: Insufficient documentation

## 2022-04-02 DIAGNOSIS — M75101 Unspecified rotator cuff tear or rupture of right shoulder, not specified as traumatic: Secondary | ICD-10-CM | POA: Diagnosis not present

## 2022-04-02 DIAGNOSIS — I129 Hypertensive chronic kidney disease with stage 1 through stage 4 chronic kidney disease, or unspecified chronic kidney disease: Secondary | ICD-10-CM | POA: Insufficient documentation

## 2022-04-02 DIAGNOSIS — Z791 Long term (current) use of non-steroidal anti-inflammatories (NSAID): Secondary | ICD-10-CM | POA: Diagnosis not present

## 2022-04-02 DIAGNOSIS — N183 Chronic kidney disease, stage 3 unspecified: Secondary | ICD-10-CM | POA: Insufficient documentation

## 2022-04-02 DIAGNOSIS — Z471 Aftercare following joint replacement surgery: Secondary | ICD-10-CM | POA: Diagnosis not present

## 2022-04-02 DIAGNOSIS — Z96611 Presence of right artificial shoulder joint: Secondary | ICD-10-CM | POA: Diagnosis not present

## 2022-04-02 DIAGNOSIS — Z7984 Long term (current) use of oral hypoglycemic drugs: Secondary | ICD-10-CM | POA: Diagnosis not present

## 2022-04-02 DIAGNOSIS — E118 Type 2 diabetes mellitus with unspecified complications: Secondary | ICD-10-CM | POA: Diagnosis not present

## 2022-04-02 DIAGNOSIS — I890 Lymphedema, not elsewhere classified: Secondary | ICD-10-CM | POA: Diagnosis not present

## 2022-04-02 DIAGNOSIS — E785 Hyperlipidemia, unspecified: Secondary | ICD-10-CM | POA: Diagnosis not present

## 2022-04-02 DIAGNOSIS — Z79899 Other long term (current) drug therapy: Secondary | ICD-10-CM | POA: Insufficient documentation

## 2022-04-02 DIAGNOSIS — M19011 Primary osteoarthritis, right shoulder: Secondary | ICD-10-CM | POA: Insufficient documentation

## 2022-04-02 DIAGNOSIS — G8918 Other acute postprocedural pain: Secondary | ICD-10-CM | POA: Diagnosis not present

## 2022-04-02 HISTORY — PX: REVERSE SHOULDER ARTHROPLASTY: SHX5054

## 2022-04-02 LAB — GLUCOSE, CAPILLARY
Glucose-Capillary: 136 mg/dL — ABNORMAL HIGH (ref 70–99)
Glucose-Capillary: 167 mg/dL — ABNORMAL HIGH (ref 70–99)

## 2022-04-02 SURGERY — ARTHROPLASTY, SHOULDER, TOTAL, REVERSE
Anesthesia: General | Site: Shoulder | Laterality: Right

## 2022-04-02 MED ORDER — DEXAMETHASONE SODIUM PHOSPHATE 10 MG/ML IJ SOLN
INTRAMUSCULAR | Status: DC | PRN
  Administered 2022-04-02: 4 mg via INTRAVENOUS

## 2022-04-02 MED ORDER — HYDROMORPHONE HCL 1 MG/ML IJ SOLN: 0.2500 mg | INTRAMUSCULAR | Status: DC | PRN

## 2022-04-02 MED ORDER — NAPROXEN 500 MG PO TABS: 500.0000 mg | ORAL_TABLET | Freq: Two times a day (BID) | ORAL | 1 refills | Status: AC

## 2022-04-02 MED ORDER — BUPIVACAINE HCL (PF) 0.5 % IJ SOLN
INTRAMUSCULAR | Status: DC | PRN
  Administered 2022-04-02: 20 mL via PERINEURAL

## 2022-04-02 MED ORDER — BUPIVACAINE LIPOSOME 1.3 % IJ SUSP
INTRAMUSCULAR | Status: DC | PRN
  Administered 2022-04-02: 10 mL via PERINEURAL

## 2022-04-02 MED ORDER — GLYCOPYRROLATE 0.2 MG/ML IJ SOLN
INTRAMUSCULAR | Status: DC | PRN
  Administered 2022-04-02: .2 mg via INTRAVENOUS

## 2022-04-02 MED ORDER — PROPOFOL 10 MG/ML IV BOLUS
INTRAVENOUS | Status: DC | PRN
  Administered 2022-04-02: 150 mg via INTRAVENOUS

## 2022-04-02 MED ORDER — 0.9 % SODIUM CHLORIDE (POUR BTL) OPTIME
TOPICAL | Status: DC | PRN
  Administered 2022-04-02: 1000 mL

## 2022-04-02 MED ORDER — PHENYLEPHRINE 80 MCG/ML (10ML) SYRINGE FOR IV PUSH (FOR BLOOD PRESSURE SUPPORT)
PREFILLED_SYRINGE | INTRAVENOUS | Status: DC | PRN
  Administered 2022-04-02 (×2): 160 ug via INTRAVENOUS

## 2022-04-02 MED ORDER — ORAL CARE MOUTH RINSE: 15.0000 mL | Freq: Once | OROMUCOSAL | Status: DC

## 2022-04-02 MED ORDER — ONDANSETRON HCL 4 MG/2ML IJ SOLN
INTRAMUSCULAR | Status: DC | PRN
  Administered 2022-04-02: 4 mg via INTRAVENOUS

## 2022-04-02 MED ORDER — DEXAMETHASONE SODIUM PHOSPHATE 10 MG/ML IJ SOLN
INTRAMUSCULAR | Status: AC
  Filled 2022-04-02: qty 1

## 2022-04-02 MED ORDER — AMISULPRIDE (ANTIEMETIC) 5 MG/2ML IV SOLN: 10.0000 mg | Freq: Once | INTRAVENOUS | Status: DC | PRN

## 2022-04-02 MED ORDER — FENTANYL CITRATE (PF) 100 MCG/2ML IJ SOLN
INTRAMUSCULAR | Status: AC
  Filled 2022-04-02: qty 2

## 2022-04-02 MED ORDER — PROPOFOL 10 MG/ML IV BOLUS
INTRAVENOUS | Status: AC
  Filled 2022-04-02: qty 20

## 2022-04-02 MED ORDER — SUGAMMADEX SODIUM 500 MG/5ML IV SOLN
INTRAVENOUS | Status: DC | PRN
  Administered 2022-04-02: 300 mg via INTRAVENOUS

## 2022-04-02 MED ORDER — LABETALOL HCL 5 MG/ML IV SOLN
INTRAVENOUS | Status: DC | PRN
  Administered 2022-04-02: 5 mg via INTRAVENOUS

## 2022-04-02 MED ORDER — ROCURONIUM BROMIDE 10 MG/ML (PF) SYRINGE
PREFILLED_SYRINGE | INTRAVENOUS | Status: AC
  Filled 2022-04-02: qty 10

## 2022-04-02 MED ORDER — LACTATED RINGERS IV BOLUS
250.0000 mL | Freq: Once | INTRAVENOUS | Status: AC
  Administered 2022-04-02: 250 mL via INTRAVENOUS

## 2022-04-02 MED ORDER — MIDAZOLAM HCL 2 MG/2ML IJ SOLN
INTRAMUSCULAR | Status: AC
  Filled 2022-04-02: qty 2

## 2022-04-02 MED ORDER — ROCURONIUM BROMIDE 10 MG/ML (PF) SYRINGE
PREFILLED_SYRINGE | INTRAVENOUS | Status: DC | PRN
  Administered 2022-04-02: 100 mg via INTRAVENOUS

## 2022-04-02 MED ORDER — LIDOCAINE HCL (PF) 2 % IJ SOLN
INTRAMUSCULAR | Status: AC
  Filled 2022-04-02: qty 5

## 2022-04-02 MED ORDER — EPHEDRINE SULFATE-NACL 50-0.9 MG/10ML-% IV SOSY
PREFILLED_SYRINGE | INTRAVENOUS | Status: DC | PRN
  Administered 2022-04-02 (×2): 5 mg via INTRAVENOUS

## 2022-04-02 MED ORDER — PHENYLEPHRINE HCL-NACL 20-0.9 MG/250ML-% IV SOLN
INTRAVENOUS | Status: AC
  Filled 2022-04-02: qty 750

## 2022-04-02 MED ORDER — CEFAZOLIN SODIUM-DEXTROSE 2-4 GM/100ML-% IV SOLN
2.0000 g | INTRAVENOUS | Status: AC
  Administered 2022-04-02: 2 g via INTRAVENOUS
  Filled 2022-04-02: qty 100

## 2022-04-02 MED ORDER — STERILE WATER FOR IRRIGATION IR SOLN
Status: DC | PRN
Start: 2022-04-02 — End: 2022-04-02
  Administered 2022-04-02: 2000 mL

## 2022-04-02 MED ORDER — HYDROMORPHONE HCL 2 MG PO TABS
2.0000 mg | ORAL_TABLET | ORAL | 0 refills | Status: DC | PRN
Start: 2022-04-02 — End: 2023-09-07

## 2022-04-02 MED ORDER — LACTATED RINGERS IV BOLUS
500.0000 mL | Freq: Once | INTRAVENOUS | Status: AC
  Administered 2022-04-02: 500 mL via INTRAVENOUS

## 2022-04-02 MED ORDER — ONDANSETRON HCL 4 MG/2ML IJ SOLN
INTRAMUSCULAR | Status: AC
  Filled 2022-04-02: qty 2

## 2022-04-02 MED ORDER — ESMOLOL HCL 100 MG/10ML IV SOLN
INTRAVENOUS | Status: DC | PRN
  Administered 2022-04-02: 50 mg via INTRAVENOUS
  Administered 2022-04-02: 20 mg via INTRAVENOUS
  Administered 2022-04-02: 30 mg via INTRAVENOUS

## 2022-04-02 MED ORDER — VANCOMYCIN HCL 1000 MG IV SOLR
INTRAVENOUS | Status: DC | PRN
  Administered 2022-04-02: 1000 mg via TOPICAL

## 2022-04-02 MED ORDER — ESMOLOL HCL 100 MG/10ML IV SOLN
INTRAVENOUS | Status: AC
  Filled 2022-04-02: qty 10

## 2022-04-02 MED ORDER — SUGAMMADEX SODIUM 500 MG/5ML IV SOLN
INTRAVENOUS | Status: AC
  Filled 2022-04-02: qty 5

## 2022-04-02 MED ORDER — PROMETHAZINE HCL 25 MG/ML IJ SOLN: 6.2500 mg | INTRAMUSCULAR | Status: DC | PRN

## 2022-04-02 MED ORDER — LABETALOL HCL 5 MG/ML IV SOLN
INTRAVENOUS | Status: AC
  Filled 2022-04-02: qty 4

## 2022-04-02 MED ORDER — TRANEXAMIC ACID-NACL 1000-0.7 MG/100ML-% IV SOLN
1000.0000 mg | INTRAVENOUS | Status: AC
  Administered 2022-04-02: 1000 mg via INTRAVENOUS
  Filled 2022-04-02: qty 100

## 2022-04-02 MED ORDER — VANCOMYCIN HCL 1000 MG IV SOLR
INTRAVENOUS | Status: AC
  Filled 2022-04-02: qty 20

## 2022-04-02 MED ORDER — TRAMADOL HCL 50 MG PO TABS
50.0000 mg | ORAL_TABLET | Freq: Four times a day (QID) | ORAL | 0 refills | Status: AC | PRN
Start: 2022-04-02 — End: 2023-04-02

## 2022-04-02 MED ORDER — ONDANSETRON HCL 4 MG PO TABS: 4.0000 mg | ORAL_TABLET | Freq: Three times a day (TID) | ORAL | 0 refills | Status: AC | PRN

## 2022-04-02 MED ORDER — PHENYLEPHRINE HCL-NACL 20-0.9 MG/250ML-% IV SOLN
INTRAVENOUS | Status: DC | PRN
  Administered 2022-04-02: 50 ug/min via INTRAVENOUS

## 2022-04-02 MED ORDER — LACTATED RINGERS IV SOLN: INTRAVENOUS | Status: DC

## 2022-04-02 MED ORDER — FENTANYL CITRATE (PF) 100 MCG/2ML IJ SOLN
INTRAMUSCULAR | Status: DC | PRN
Start: 2022-04-02 — End: 2022-04-02
  Administered 2022-04-02 (×2): 50 ug via INTRAVENOUS

## 2022-04-02 MED ORDER — VANCOMYCIN HCL IN DEXTROSE 1-5 GM/200ML-% IV SOLN
1000.0000 mg | INTRAVENOUS | Status: AC
  Administered 2022-04-02: 1000 mg via INTRAVENOUS
  Filled 2022-04-02: qty 200

## 2022-04-02 MED ORDER — TRANEXAMIC ACID 1000 MG/10ML IV SOLN: 1000.0000 mg | INTRAVENOUS | Status: DC

## 2022-04-02 MED ORDER — CYCLOBENZAPRINE HCL 10 MG PO TABS: 10.0000 mg | ORAL_TABLET | Freq: Three times a day (TID) | ORAL | 1 refills | Status: AC | PRN

## 2022-04-02 MED ORDER — CHLORHEXIDINE GLUCONATE 0.12 % MT SOLN: 15.0000 mL | Freq: Once | OROMUCOSAL | Status: DC

## 2022-04-02 SURGICAL SUPPLY — 76 items
BAG COUNTER SPONGE SURGICOUNT (BAG) ×1 IMPLANT
BAG ZIPLOCK 12X15 (MISCELLANEOUS) ×2 IMPLANT
BLADE SAW SGTL 83.5X18.5 (BLADE) ×2 IMPLANT
BNDG COHESIVE 4X5 TAN ST LF (GAUZE/BANDAGES/DRESSINGS) ×2 IMPLANT
COOLER ICEMAN CLASSIC (MISCELLANEOUS) ×1 IMPLANT
COVER BACK TABLE 60X90IN (DRAPES) ×2 IMPLANT
COVER SURGICAL LIGHT HANDLE (MISCELLANEOUS) ×2 IMPLANT
CUP SUT UNIV REVERS 39 NEU (Shoulder) ×1 IMPLANT
DERMABOND ADVANCED (GAUZE/BANDAGES/DRESSINGS) ×1
DERMABOND ADVANCED .7 DNX12 (GAUZE/BANDAGES/DRESSINGS) ×1 IMPLANT
DRAPE INCISE IOBAN 66X45 STRL (DRAPES) IMPLANT
DRAPE ORTHO SPLIT 77X108 STRL (DRAPES) ×2
DRAPE SHEET LG 3/4 BI-LAMINATE (DRAPES) ×2 IMPLANT
DRAPE SURG 17X11 SM STRL (DRAPES) ×2 IMPLANT
DRAPE SURG ORHT 6 SPLT 77X108 (DRAPES) ×2 IMPLANT
DRAPE TOP 10253 STERILE (DRAPES) ×2 IMPLANT
DRAPE U-SHAPE 47X51 STRL (DRAPES) ×2 IMPLANT
DRESSING AQUACEL AG SP 3.5X6 (GAUZE/BANDAGES/DRESSINGS) ×1 IMPLANT
DRSG AQUACEL AG ADV 3.5X10 (GAUZE/BANDAGES/DRESSINGS) IMPLANT
DRSG AQUACEL AG SP 3.5X6 (GAUZE/BANDAGES/DRESSINGS) ×2
DRSG TEGADERM 8X12 (GAUZE/BANDAGES/DRESSINGS) ×2 IMPLANT
DURAPREP 26ML APPLICATOR (WOUND CARE) ×2 IMPLANT
ELECT BLADE TIP CTD 4 INCH (ELECTRODE) ×2 IMPLANT
ELECT PENCIL ROCKER SW 15FT (MISCELLANEOUS) ×2 IMPLANT
ELECT REM PT RETURN 15FT ADLT (MISCELLANEOUS) ×2 IMPLANT
FACESHIELD WRAPAROUND (MASK) ×8 IMPLANT
FACESHIELD WRAPAROUND OR TEAM (MASK) ×4 IMPLANT
GLENOID UNI REV MOD 24 +2 LAT (Joint) ×1 IMPLANT
GLENOSPHERE 39+4 LAT/24 UNI RV (Joint) ×1 IMPLANT
GLOVE BIO SURGEON STRL SZ7 (GLOVE) ×2 IMPLANT
GLOVE BIO SURGEON STRL SZ7.5 (GLOVE) ×2 IMPLANT
GLOVE BIO SURGEON STRL SZ8 (GLOVE) ×2 IMPLANT
GLOVE BIOGEL PI IND STRL 7.0 (GLOVE) ×1 IMPLANT
GLOVE BIOGEL PI IND STRL 8 (GLOVE) ×1 IMPLANT
GLOVE BIOGEL PI INDICATOR 7.0 (GLOVE) ×1
GLOVE BIOGEL PI INDICATOR 8 (GLOVE) ×1
GOWN STRL REIN XL XLG (GOWN DISPOSABLE) ×4 IMPLANT
INSERT HUMERAL MED 39/ +3 (Shoulder) IMPLANT
INSERT MEDIUM HUMERAL 39/ +3 (Shoulder) ×1 IMPLANT
KIT BASIN OR (CUSTOM PROCEDURE TRAY) ×2 IMPLANT
KIT TURNOVER KIT A (KITS) ×1 IMPLANT
MANIFOLD NEPTUNE II (INSTRUMENTS) ×2 IMPLANT
NDL TAPERED W/ NITINOL LOOP (MISCELLANEOUS) ×1 IMPLANT
NEEDLE TAPERED W/ NITINOL LOOP (MISCELLANEOUS) ×2 IMPLANT
NS IRRIG 1000ML POUR BTL (IV SOLUTION) ×2 IMPLANT
PACK SHOULDER (CUSTOM PROCEDURE TRAY) ×2 IMPLANT
PAD ARMBOARD 7.5X6 YLW CONV (MISCELLANEOUS) ×2 IMPLANT
PAD COLD SHLDR WRAP-ON (PAD) ×1 IMPLANT
PIN NITINOL TARGETER 2.8 (PIN) IMPLANT
PIN SET MODULAR GLENOID SYSTEM (PIN) ×1 IMPLANT
RESTRAINT HEAD UNIVERSAL NS (MISCELLANEOUS) ×2 IMPLANT
SCREW CENTRAL MOD 35 (Screw) ×1 IMPLANT
SCREW PERI LOCK 5.5X16 (Screw) ×1 IMPLANT
SCREW PERI LOCK 5.5X24 (Screw) ×1 IMPLANT
SCREW PERIPHERAL 5.5X20 LOCK (Screw) ×1 IMPLANT
SCREW PERIPHERAL 5.5X28 LOCK (Screw) ×1 IMPLANT
SET PIN UNIVERSAL REVERSE (SET/KITS/TRAYS/PACK) ×1 IMPLANT
SLING ARM FOAM STRAP LRG (SOFTGOODS) ×1 IMPLANT
SLING ARM FOAM STRAP MED (SOFTGOODS) IMPLANT
SPONGE T-LAP 18X18 ~~LOC~~+RFID (SPONGE) IMPLANT
SPONGE T-LAP 4X18 ~~LOC~~+RFID (SPONGE) ×2 IMPLANT
STEM HUMERAL UNI REVERS SZ9 (Stem) ×1 IMPLANT
SUCTION FRAZIER HANDLE 12FR (TUBING) ×1
SUCTION TUBE FRAZIER 12FR DISP (TUBING) ×1 IMPLANT
SUT FIBERWIRE #2 38 T-5 BLUE (SUTURE)
SUT MNCRL AB 3-0 PS2 18 (SUTURE) ×2 IMPLANT
SUT MON AB 2-0 CT1 36 (SUTURE) ×2 IMPLANT
SUT VIC AB 1 CT1 36 (SUTURE) ×2 IMPLANT
SUTURE FIBERWR #2 38 T-5 BLUE (SUTURE) IMPLANT
SUTURE TAPE 1.3 40 TPR END (SUTURE) ×2 IMPLANT
SUTURETAPE 1.3 40 TPR END (SUTURE) ×4
TOWEL OR 17X26 10 PK STRL BLUE (TOWEL DISPOSABLE) ×2 IMPLANT
TOWEL OR NON WOVEN STRL DISP B (DISPOSABLE) ×2 IMPLANT
TUBE SUCTION HIGH CAP CLEAR NV (SUCTIONS) ×2 IMPLANT
WATER STERILE IRR 1000ML POUR (IV SOLUTION) ×4 IMPLANT
YANKAUER SUCT BULB TIP 10FT TU (MISCELLANEOUS) IMPLANT

## 2022-04-02 NOTE — Anesthesia Postprocedure Evaluation (Signed)
Anesthesia Post Note ? ?Patient: Maurice Perkins ? ?Procedure(s) Performed: REVERSE SHOULDER ARTHROPLASTY (Right: Shoulder) ? ?  ? ?Patient location during evaluation: PACU ?Anesthesia Type: General ?Level of consciousness: awake and alert ?Pain management: pain level controlled ?Vital Signs Assessment: post-procedure vital signs reviewed and stable ?Respiratory status: spontaneous breathing, nonlabored ventilation and respiratory function stable ?Cardiovascular status: blood pressure returned to baseline and stable ?Postop Assessment: no apparent nausea or vomiting ?Anesthetic complications: no ? ? ?No notable events documented. ? ?Last Vitals:  ?Vitals:  ? 04/02/22 1000 04/02/22 1036  ?BP: (!) 148/75 135/79  ?Pulse: 72 84  ?Resp: 13 14  ?Temp:  36.7 ?C  ?SpO2: 94% 94%  ?  ?Last Pain:  ?Vitals:  ? 04/02/22 1036  ?TempSrc: Oral  ?PainSc: 0-No pain  ? ? ?  ?  ?  ?  ?  ?  ? ?Lynda Rainwater ? ? ? ? ?

## 2022-04-02 NOTE — Transfer of Care (Signed)
Immediate Anesthesia Transfer of Care Note ? ?Patient: Maurice Perkins ? ?Procedure(s) Performed: REVERSE SHOULDER ARTHROPLASTY (Right: Shoulder) ? ?Patient Location: PACU ? ?Anesthesia Type:GA combined with regional for post-op pain ? ?Level of Consciousness: awake and patient cooperative ? ?Airway & Oxygen Therapy: Patient Spontanous Breathing and Patient connected to face mask oxygen ? ?Post-op Assessment: Report given to RN and Post -op Vital signs reviewed and stable ? ?Post vital signs: Reviewed and stable ? ?Last Vitals:  ?Vitals Value Taken Time  ?BP 145/78 04/02/22 0935  ?Temp    ?Pulse 80 04/02/22 0941  ?Resp 17 04/02/22 0941  ?SpO2 93 % 04/02/22 0941  ?Vitals shown include unvalidated device data. ? ?Last Pain:  ?Vitals:  ? 04/02/22 0613  ?TempSrc:   ?PainSc: 0-No pain  ?   ? ?Patients Stated Pain Goal: 3 (04/02/22 7858) ? ?Complications: No notable events documented. ?

## 2022-04-02 NOTE — H&P (Signed)
Maurice Perkins   ? ?Chief Complaint: Right shoulder rotator cuff tear arthropathy ?HPI: The patient is a 76 y.o. male with chronic and progressive increasing right shoulder pain related to severe rotator cuff tear arthropathy.  Due to his increasing functional limitations and failure to respond to prolonged attempts at conservative management, he is brought to the operating room today for planned right shoulder reverse arthroplasty. ? ?Past Medical History:  ?Diagnosis Date  ? CKD (chronic kidney disease) stage 3, GFR 30-59 ml/min (HCC)   ? Diabetes ([place])   ? insulin requiring type 2.   ? Dry skin   ? pt has skin oil deficiency ischtiosis  ? Hyperlipidemia   ? Hypertension   ? Other general symptoms(780.99)   ? Pancreatic mass 2015  ? cystic mucinous neoplasm. opted for non-surgical observation. follwed with serial MRI/MRCP.    ? ? ?'@[email or web address]'$  ? ?Past Surgical History:  ?Procedure Laterality Date  ? ABDOMINAL EXPLORATION SURGERY  09/25/2015  ? CATARACT EXTRACTION W/ INTRAOCULAR LENS IMPLANT Left   ? COLONOSCOPY    ? 2004 dr Earlean Shawl  ? COLONOSCOPY N/A 11/20/2017  ? Procedure: COLONOSCOPY;  Surgeon: Milus Banister, MD;  Location[facility] ENDOSCOPY;  Service: Endoscopy;  Laterality: N/A;  ? EUS N/A 05/17/2014  ? Procedure: UPPER ENDOSCOPIC ULTRASOUND (EUS) LINEAR;  Surgeon: Milus Banister, MD;  [facility] ENDOSCOPY;  Service: Endoscopy;  Laterality: N/A;  ? EYE SURGERY Left   ? Retina repair  ? INSERTION OF MESH N/A 09/30/2016  ? Procedure: INSERTION OF MESH;  Surgeon: Stark Klein, MD;  Location: [place];  Service: General;  Laterality: N/A;  ? LAPAROTOMY N/A 09/25/2015  ? Procedure: EXPLORATORY LAPAROTOMY small bowel resection;  Surgeon: Stark Klein, MD;  Location: [place];  Service: General;  Laterality: N/A;  ? NASAL SINUS SURGERY  1970's  ? pilonodal cyst    ? in high school-bottom of spine  ? SHOULDER SURGERY Right 2000  ? VENTRAL HERNIA REPAIR  09/30/2016  ? LAPROSCOPIC  ? VENTRAL HERNIA REPAIR N/A  09/30/2016  ? Procedure: LAPAROSCOPIC VENTRAL HERNIA;  Surgeon: Stark Klein, MD;  Location: [place];  Service: General;  Laterality: N/A;  ? ? ?Family History  ?Problem Relation Age of Onset  ? Heart disease Mother   ? Breast cancer Mother   ?     breast  ? Colon cancer Neg Hx   ? Stomach cancer Neg Hx   ? Ulcerative colitis Neg Hx   ? ? ?Social History:  reports that he quit smoking about 50 years ago. His smoking use included cigarettes. He has a 30.00 pack-year smoking history. He has never used smokeless tobacco. He reports that he does not drink alcohol and does not use drugs. ? ?Medications Prior to Admission  ?Medication Sig Dispense Refill  ? aspirin EC 81 MG tablet Take 81 mg by mouth daily.    ? atenolol (TENORMIN) 50 MG tablet Take 25 mg by mouth every morning.     ? atorvastatin (LIPITOR) 40 MG tablet Take 40 mg by mouth daily.    ? Emollient (CERAVE DAILY MOISTURIZING EX) Apply 1 application topically daily as needed (dry skin).    ? empagliflozin (JARDIANCE) 10 MG TABS tablet Take 10 mg by mouth daily.    ? fenofibrate 160 MG tablet Take 160 mg by mouth daily.    ? fluticasone (FLONASE) 50 MCG/ACT nasal spray Place 1 spray into both nostrils daily as needed for allergies or rhinitis.    ? insulin aspart (  NOVOLOG) 100 UNIT/ML injection Inject 25 Units into the skin 3 (three) times daily with meals.    ? insulin glargine, 2 Unit Dial, (TOUJEO MAX SOLOSTAR) 300 UNIT/ML Solostar Pen Inject 80 Units into the skin daily.    ? losartan (COZAAR) 50 MG tablet Take 50 mg by mouth daily.    ? metFORMIN (GLUCOPHAGE-XR) 500 MG 24 hr tablet Take 500-1,000 mg by mouth See admin instructions. Take 1000 mg by mouth at noon and 500 mg in the evening    ? Misc Natural Products (GLUCOSAMINE CHONDROITIN MSM PO) Take 2 tablets by mouth daily.    ? Multiple Vitamin (MULTIVITAMIN WITH MINERALS) TABS tablet Take 1 tablet by mouth daily.    ? tetrahydrozoline 0.05 % ophthalmic solution Place 1-3 drops into both eyes 3 (three)  times daily as needed (for itchy/scratchy eyes).    ? albuterol (VENTOLIN HFA) 108 (90 Base) MCG/ACT inhaler Inhale 1-2 puffs into the lungs every 6 (six) hours as needed for wheezing or shortness of breath.    ? nitroGLYCERIN (NITROSTAT) 0.4 MG SL tablet Place 0.4 mg under the tongue every 5 (five) minutes as needed for chest pain.    ? ? ? ?Physical Exam: Right shoulder demonstrates painful and guarded motion as noted at his recent office visits.  He is neurovascular intact in the right upper extremity.  Examination otherwise as documented at his recent office visits. ? ?Plain radiographs confirm changes consistent with chronic rotator cuff tear arthropathy. ? ?Vitals ? ?Temp:  [98 ?F (36.7 ?C)] 98 ?F (36.7 ?C) (04/20 0551) ?Pulse Rate:  [82] 82 (04/20 0551) ?Resp:  [17] 17 (04/20 0551) ?BP: (151)/(74) 151/74 (04/20 0551) ?SpO2:  [94 %] 94 % (04/20 0551) ?Weight:  [96 kg] 96 kg (04/20 7035) ? ?Assessment/Plan ? ?Impression: Right shoulder rotator cuff tear arthropathy ? ?Plan of Action: Procedure(s): ?REVERSE SHOULDER ARTHROPLASTY ? ?Vee Bahe M Yennifer Segovia ?04/02/2022, 6:36 AM ?Contact # [phone] ? ? ? ? ? ?  ?

## 2022-04-02 NOTE — Anesthesia Procedure Notes (Signed)
Procedure Name: Intubation ?Date/Time: 04/02/2022 7:44 AM ?Performed by: West Pugh, CRNA ?Pre-anesthesia Checklist: Patient identified, Emergency Drugs available, Suction available, Patient being monitored and Timeout performed ?Patient Re-evaluated:Patient Re-evaluated prior to induction ?Oxygen Delivery Method: Circle system utilized ?Preoxygenation: Pre-oxygenation with 100% oxygen ?Induction Type: IV induction ?Ventilation: Mask ventilation without difficulty ?Laryngoscope Size: Mac and 4 ?Grade View: Grade II ?Tube type: Oral ?Tube size: 7.5 mm ?Number of attempts: 1 ?Airway Equipment and Method: Stylet ?Placement Confirmation: ETT inserted through vocal cords under direct vision, positive ETCO2, CO2 detector and breath sounds checked- equal and bilateral ?Secured at: 21 cm ?Tube secured with: Tape ?Dental Injury: Teeth and Oropharynx as per pre-operative assessment  ? ? ? ? ?

## 2022-04-02 NOTE — Anesthesia Procedure Notes (Signed)
Anesthesia Regional Block: Interscalene brachial plexus block  ? ?Pre-Anesthetic Checklist: , timeout performed,  Correct Patient, Correct Site, Correct Laterality,  Correct Procedure, Correct Position, site marked,  Risks and benefits discussed,  Surgical consent,  Pre-op evaluation,  At surgeon's request and post-op pain management ? ?Laterality: Right ? ?Prep: chloraprep     ?  ?Needles:  ?Injection technique: Single-shot ? ?Needle Type: Stimiplex   ? ? ?Needle Length: 9cm  ?Needle Gauge: 21  ? ? ? ?Additional Needles: ? ? ?Procedures:,,,, ultrasound used (permanent image in chart),,    ?Narrative:  ?Start time: 04/02/2022 7:25 AM ?End time: 04/02/2022 7:30 AM ?Injection made incrementally with aspirations every 5 mL. ? ?Performed by: Personally  ?Anesthesiologist: Lynda Rainwater, MD ? ? ? ? ?

## 2022-04-02 NOTE — Discharge Instructions (Signed)

## 2022-04-02 NOTE — Evaluation (Signed)
Occupational Therapy Evaluation ?Patient Details ?Name: Maurice Perkins ?MRN: [id number] ?DOB: September 10, 1946 ?Today's Date: 04/02/2022 ? ? ?History of Present Illness Patient is a 76 year old male s/p right reverse total shoulder arthroplasty. PMH: previous R sh surgery, laparotomy, hernia repair  ? ?Clinical Impression ?  ?Patient is a 76 year old male s/p shoulder replacement without functional use of right dominant upper extremity secondary to effects of surgery and interscalene block and shoulder precautions. Therapist provided education and instruction to patient and spouse in regards to exercises, precautions, positioning, donning upper extremity clothing and bathing while maintaining shoulder precautions, ice and edema management and donning/doffing sling. Patient and spouse verbalized understanding and demonstrated as needed. Patient needed assistance to donn shirt, underwear, pants, socks and shoes and provided with instruction on compensatory strategies to perform ADLs. Patient to follow up with MD for further therapy needs.  ?  ?   ? ?Recommendations for follow up therapy are one component of a multi-disciplinary discharge planning process, led by the attending physician.  Recommendations may be updated based on patient status, additional functional criteria and insurance authorization.  ? ?Follow Up Recommendations ? Follow physician's recommendations for discharge plan and follow up therapies  ?  ?Assistance Recommended at Discharge Intermittent Supervision/Assistance  ?Patient can return home with the following A little help with bathing/dressing/bathroom ? ?  ?Functional Status Assessment ? Patient has had a recent decline in their functional status and demonstrates the ability to make significant improvements in function in a reasonable and predictable amount of time.  ?Equipment Recommendations ? None recommended by OT  ?  ?   ?Precautions / Restrictions Precautions ?Precautions: Shoulder ?Type of  Shoulder Precautions: PROM 10 ER, 45 ABD,60 FE , PASSIVE ROM FOR ADL's ONLY, NOT for EXERCISES. OK to exercise elbow wrist and hand rom and for edema control   No pendulums, may allow arm to dangle   Pt may shower ?Shoulder Interventions: Shoulder sling/immobilizer;Off for dressing/bathing/exercises ?Precaution Booklet Issued: Yes (comment) ?Required Braces or Orthoses: Sling ?Restrictions ?Weight Bearing Restrictions: Yes ?RUE Weight Bearing: Non weight bearing  ? ?  ? ?Mobility Bed Mobility ?  ?  ?  ?  ?  ?  ?  ?General bed mobility comments: In chair ?  ? ? ? ?  ?Balance Overall balance assessment: Independent ?  ?  ?  ?  ?  ?  ?  ?  ?  ?  ?  ?  ?  ?  ?  ?  ?  ?  ?   ? ?ADL either performed or assessed with clinical judgement  ? ?ADL Overall ADL's : Needs assistance/impaired ?Eating/Feeding: Independent ?  ?Grooming: Independent ?  ?Upper Body Bathing: Minimal assistance ?  ?Lower Body Bathing: Moderate assistance ?  ?Upper Body Dressing : Moderate assistance;Cueing for sequencing;Standing ?Upper Body Dressing Details (indicate cue type and reason): Patient needing assistance to thread R UE into sleeve 2* nerve block and assist to button shirt ?Lower Body Dressing: Maximal assistance;Sitting/lateral leans;Sit to/from stand ?Lower Body Dressing Details (indicate cue type and reason): Spouse assisting patient with donning underwear and pants ?Toilet Transfer: Independent ?Toilet Transfer Details (indicate cue type and reason): Patient able to perform sit to stand from chair without assistance ?Toileting- Clothing Manipulation and Hygiene: Minimal assistance;Sit to/from stand;Sitting/lateral lean ?  ?  ?  ?Functional mobility during ADLs: Independent ?General ADL Comments: Patient, spouse and son educated on shoulder precautions and how to maintain during self care tasks.  ? ? ? ? ? ? ? ?  Hand Dominance Right ?  ?Extremity/Trunk Assessment Upper Extremity Assessment ?Upper Extremity Assessment: RUE  deficits/detail ?RUE Deficits / Details: + nerve block ?  ?Lower Extremity Assessment ?Lower Extremity Assessment: Overall WFL for tasks assessed ?  ?Cervical / Trunk Assessment ?Cervical / Trunk Assessment: Normal ?  ?Communication Communication ?Communication: No difficulties ?  ?Cognition Arousal/Alertness: Awake/alert ?Behavior During Therapy: [facility] for tasks assessed/performed ?Overall Cognitive Status: Within Functional Limits for tasks assessed ?  ?  ?  ?  ?  ?  ?  ?  ?  ?  ?  ?  ?  ?  ?  ?  ?  ?  ?  ?   ?Exercises Exercises: Shoulder ?  ?Shoulder Instructions Shoulder Instructions ?Donning/doffing shirt without moving shoulder: Moderate assistance;Caregiver independent with task;Patient able to independently direct caregiver ?Method for sponge bathing under operated UE: Minimal assistance;Caregiver independent with task;Patient able to independently direct caregiver ?Donning/doffing sling/immobilizer: Moderate assistance;Caregiver independent with task;Patient able to independently direct caregiver ?Correct positioning of sling/immobilizer: Minimal assistance;Caregiver independent with task;Patient able to independently direct caregiver ?ROM for elbow, wrist and digits of operated UE: Caregiver independent with task;Patient able to independently direct caregiver ?Sling wearing schedule (on at all times/off for ADL's): Caregiver independent with task;Patient able to independently direct caregiver ?Proper positioning of operated UE when showering: Caregiver independent with task;Patient able to independently direct caregiver ?Positioning of UE while sleeping: Caregiver independent with task;Patient able to independently direct caregiver  ? ? ?Home Living Family/patient expects to be discharged to:: Private residence ?Living Arrangements: Spouse/significant other ?  ?  ?  ?  ?  ?  ?  ?  ?  ?  ?  ?  ?  ?  ?  ?  ?  ? ?  ?Prior Functioning/Environment Prior Level of Function : Independent/Modified Independent ?  ?   ?  ?  ?  ?  ?  ?  ?  ? ?  ?  ?OT Problem List: Pain;Impaired UE functional use;Decreased knowledge of precautions ?  ?   ?   ?OT Goals(Current goals can be found in the care plan section) Acute Rehab OT Goals ?Patient Stated Goal: Home with family ?OT Goal Formulation: All assessment and education complete, DC therapy  ? ?AM-PAC OT "6 Clicks" Daily Activity     ?Outcome Measure Help from another person eating meals?: None ?Help from another person taking care of personal grooming?: None ?Help from another person toileting, which includes using toliet, bedpan, or urinal?: A Little ?Help from another person bathing (including washing, rinsing, drying)?: A Little ?Help from another person to put on and taking off regular upper body clothing?: A Lot ?Help from another person to put on and taking off regular lower body clothing?: A Lot ?6 Click Score: 18 ?  ?End of Session Equipment Utilized During Treatment: Other (comment) (sling) ?Nurse Communication: Other (comment) (OT complete) ? ?Activity Tolerance: Patient tolerated treatment well ?Patient left: in chair;with call bell/phone within reach;with family/visitor present ? ?OT Visit Diagnosis: Pain ?Pain - Right/Left: Right ?Pain - part of body: Shoulder  ?              ?Time: 6237-6283 ?OT Time Calculation (min): 23 min ?Charges:  OT General Charges ?$OT Visit: 1 Visit ?OT Evaluation ?$OT Eval Low Complexity: 1 Low ?OT Treatments ?$Self Care/Home Management : 8-22 mins ? ?Delbert Phenix OT ?OT pager: [phone] ? ? ?Rosemary Holms ?04/02/2022, 11:13 AM ?

## 2022-04-02 NOTE — Op Note (Signed)
04/02/2022 ? ?9:36 AM ? ?PATIENT:   Maurice Perkins  76 y.o. male ? ?PRE-OPERATIVE DIAGNOSIS:  Right shoulder rotator cuff tear arthropathy ? ?POST-OPERATIVE DIAGNOSIS: Same ? ?PROCEDURE: Right shoulder reverse arthroplasty utilizing a press-fit size 9 Arthrex stem with a neutral metaphysis, +3 polyethylene insert, 39/+4 glenosphere and a small/+2 baseplate ? ?SURGEON:  Marin Shutter M.D. ? ?ASSISTANTS: Jenetta Loges, PA-C ? ?ANESTHESIA:   General endotracheal and interscalene block with Exparel ? ?EBL: 150 cc ? ?SPECIMEN: None ? ?Drains: None ? ? ?PATIENT DISPOSITION:  PACU - hemodynamically stable. ? ? ? ?PLAN OF CARE: Discharge to home after PACU ? ?Brief history: ? ?Patient is a 76 year old gentleman who has had chronic and progressively increasing pain related to severe rotator cuff tear arthropathy.  Due to his increasing functional limitations and failure to respond to prolonged attempts at conservative management, he is brought to the operating this time for planned right shoulder reverse arthroplasty. ? ?Preoperatively, I counseled the patient regarding treatment options and risks versus benefits thereof.  Possible surgical complications were all reviewed including potential for bleeding, infection, neurovascular injury, persistent pain, loss of motion, anesthetic complication, failure of the implant, and possible need for additional surgery. They understand and accept and agrees with our planned procedure. ? ? ?Procedure in detail: ? ?After undergoing routine preop evaluation the patient received prophylactic antibiotics and interscalene block with Exparel was established in the holding area by the anesthesia department.  Subsequently placed spine on the operating table and underwent the smooth induction of a general endotracheal anesthesia.  Placed into the beachchair position and appropriately padded and protected.  The right shoulder girdle region was then sterilely prepped and draped in standard  fashion.  Timeout was called.  A deltopectoral approach to the right shoulder is made through an approximate 10 cm incision.  Skin flaps elevated dissection carried deeply and the deltopectoral interval was then developed from proximal to distal.  Of note this interval was not well-defined and there was intramuscular dissection necessary's.  There was no dominant cephalic vein identified.  Adhesions were then divided beneath the deltoid and the conjoined tendon mobilized and retracted medially.  The long head biceps tendon was then tenodesed with the proximal segment excised.  The subscapularis was then separated from the lesser tuberosity using electrocautery and was then tagged with a pair of suture tape sutures.  Capsular attachments were then divided from the anterior and infra margins of the humeral neck and the humeral head was then delivered through the wound with severe arthritic change noted.  An extra medullary guide was then used to outline a proposed humeral head resection which we performed with an oscillating saw at approximately 20 degrees of retroversion.  Marginal osteophytes were removed and a metal cap was then placed over the cut proximal humeral surface.  The glenoid was then exposed with the appropriate retractors and a circumferential labral resection was then performed.  Guidepin directed into the center of the glenoid and the glenoid was then reamed with the central followed by the peripheral reamer to a stable subchondral bony bed.  Preparation completed with central drill and tapped for 35 mm lag screw.  Our baseplate was then assembled and then inserted after vancomycin powder had been spread onto the threads of the lag screw.  The peripheral locking screws were all then placed using standard technique with excellent fixation.  A 39/+4 glenosphere was then impacted onto the baseplate and the central locking screw was placed.  We  returned our attention to the proximal humerus where the  canal was opened and we broached up to a size 9 and approximately 20 degrees of retroversion.  Neutral metaphyseal reaming guide was used to prepare the metaphysis.  A trial implant was then placed and reduction showed good motion good stability and good soft tissue balance.  Our trial was then removed the final implant was then assembled.  The canal was irrigated cleaned and dried with vancomycin powder spread into the humeral canal and the final implant was then seated with excellent fit and fixation.  Trial reduction showed excellent soft tissue balance with a +3 poly-.  The final +3 poly was then impacted onto the implant after was cleaned and dried.  Final reduction showed excellent motion good stability good soft tissue balance much to our satisfaction.  The subscapularis was then confirmed to have good elasticity and was then repaired back to the eyelets on the collar the implant.  Final irrigation completed.  Hemostasis was obtained.  Balance of the vancomycin powder was spread liberally throughout the deep soft tissue planes.  The deltopectoral interval was reapproximated with a series of figure-of-eight number Vicryl suture.  2-0 Monocryl used to the subcu layer and intracuticular 3 Monocryl for the skin followed by Dermabond and Aquacel dressing.  Right arm was then placed into a sling.  The patient was awakened, extubated, and taken to the recovery room in stable condition. ? ?Jenetta Loges, PA-C was utilized as an Environmental consultant throughout this case, essential for help with positioning the patient, positioning extremity, tissue manipulation, implantation of the prosthesis, suture management, wound closure, and intraoperative decision-making. ? ?Marin Shutter MD ? ? ?Contact # [phone] ? ? ?  ?

## 2022-04-03 ENCOUNTER — Encounter (HOSPITAL_COMMUNITY): Payer: Self-pay | Admitting: Orthopedic Surgery

## 2022-04-03 NOTE — Addendum Note (Signed)
Addendum  created 04/03/22 1256 by West Pugh, CRNA  ? Flowsheet accepted, Intraprocedure Flowsheets edited  ?  ?

## 2022-04-16 DIAGNOSIS — Z5189 Encounter for other specified aftercare: Secondary | ICD-10-CM | POA: Diagnosis not present

## 2022-04-16 DIAGNOSIS — Z96611 Presence of right artificial shoulder joint: Secondary | ICD-10-CM | POA: Diagnosis not present

## 2022-04-29 DIAGNOSIS — M25511 Pain in right shoulder: Secondary | ICD-10-CM | POA: Diagnosis not present

## 2022-04-29 DIAGNOSIS — M25611 Stiffness of right shoulder, not elsewhere classified: Secondary | ICD-10-CM | POA: Diagnosis not present

## 2022-05-05 DIAGNOSIS — M25611 Stiffness of right shoulder, not elsewhere classified: Secondary | ICD-10-CM | POA: Diagnosis not present

## 2022-05-05 DIAGNOSIS — M25511 Pain in right shoulder: Secondary | ICD-10-CM | POA: Diagnosis not present

## 2022-05-08 DIAGNOSIS — M25511 Pain in right shoulder: Secondary | ICD-10-CM | POA: Diagnosis not present

## 2022-05-08 DIAGNOSIS — M25611 Stiffness of right shoulder, not elsewhere classified: Secondary | ICD-10-CM | POA: Diagnosis not present

## 2022-05-13 DIAGNOSIS — M25611 Stiffness of right shoulder, not elsewhere classified: Secondary | ICD-10-CM | POA: Diagnosis not present

## 2022-05-13 DIAGNOSIS — M25511 Pain in right shoulder: Secondary | ICD-10-CM | POA: Diagnosis not present

## 2022-05-15 DIAGNOSIS — M25611 Stiffness of right shoulder, not elsewhere classified: Secondary | ICD-10-CM | POA: Diagnosis not present

## 2022-05-15 DIAGNOSIS — M25511 Pain in right shoulder: Secondary | ICD-10-CM | POA: Diagnosis not present

## 2022-05-19 DIAGNOSIS — M25611 Stiffness of right shoulder, not elsewhere classified: Secondary | ICD-10-CM | POA: Diagnosis not present

## 2022-05-19 DIAGNOSIS — M25511 Pain in right shoulder: Secondary | ICD-10-CM | POA: Diagnosis not present

## 2022-05-21 DIAGNOSIS — M25611 Stiffness of right shoulder, not elsewhere classified: Secondary | ICD-10-CM | POA: Diagnosis not present

## 2022-05-21 DIAGNOSIS — M25511 Pain in right shoulder: Secondary | ICD-10-CM | POA: Diagnosis not present

## 2022-05-26 DIAGNOSIS — M25511 Pain in right shoulder: Secondary | ICD-10-CM | POA: Diagnosis not present

## 2022-05-26 DIAGNOSIS — M25611 Stiffness of right shoulder, not elsewhere classified: Secondary | ICD-10-CM | POA: Diagnosis not present

## 2022-05-28 DIAGNOSIS — M25611 Stiffness of right shoulder, not elsewhere classified: Secondary | ICD-10-CM | POA: Diagnosis not present

## 2022-05-28 DIAGNOSIS — M25511 Pain in right shoulder: Secondary | ICD-10-CM | POA: Diagnosis not present

## 2022-06-02 DIAGNOSIS — M25611 Stiffness of right shoulder, not elsewhere classified: Secondary | ICD-10-CM | POA: Diagnosis not present

## 2022-06-02 DIAGNOSIS — M25511 Pain in right shoulder: Secondary | ICD-10-CM | POA: Diagnosis not present

## 2022-06-04 DIAGNOSIS — M25611 Stiffness of right shoulder, not elsewhere classified: Secondary | ICD-10-CM | POA: Diagnosis not present

## 2022-06-04 DIAGNOSIS — M25511 Pain in right shoulder: Secondary | ICD-10-CM | POA: Diagnosis not present

## 2022-06-08 DIAGNOSIS — L821 Other seborrheic keratosis: Secondary | ICD-10-CM | POA: Diagnosis not present

## 2022-06-08 DIAGNOSIS — D225 Melanocytic nevi of trunk: Secondary | ICD-10-CM | POA: Diagnosis not present

## 2022-06-08 DIAGNOSIS — D485 Neoplasm of uncertain behavior of skin: Secondary | ICD-10-CM | POA: Diagnosis not present

## 2022-06-08 DIAGNOSIS — Z85828 Personal history of other malignant neoplasm of skin: Secondary | ICD-10-CM | POA: Diagnosis not present

## 2022-06-09 DIAGNOSIS — M25511 Pain in right shoulder: Secondary | ICD-10-CM | POA: Diagnosis not present

## 2022-06-09 DIAGNOSIS — M25611 Stiffness of right shoulder, not elsewhere classified: Secondary | ICD-10-CM | POA: Diagnosis not present

## 2022-06-11 DIAGNOSIS — M25511 Pain in right shoulder: Secondary | ICD-10-CM | POA: Diagnosis not present

## 2022-06-11 DIAGNOSIS — M25611 Stiffness of right shoulder, not elsewhere classified: Secondary | ICD-10-CM | POA: Diagnosis not present

## 2022-06-17 DIAGNOSIS — M25511 Pain in right shoulder: Secondary | ICD-10-CM | POA: Diagnosis not present

## 2022-06-17 DIAGNOSIS — M25611 Stiffness of right shoulder, not elsewhere classified: Secondary | ICD-10-CM | POA: Diagnosis not present

## 2022-06-23 DIAGNOSIS — M25511 Pain in right shoulder: Secondary | ICD-10-CM | POA: Diagnosis not present

## 2022-06-23 DIAGNOSIS — M25611 Stiffness of right shoulder, not elsewhere classified: Secondary | ICD-10-CM | POA: Diagnosis not present

## 2022-06-25 DIAGNOSIS — M25511 Pain in right shoulder: Secondary | ICD-10-CM | POA: Diagnosis not present

## 2022-06-25 DIAGNOSIS — M25611 Stiffness of right shoulder, not elsewhere classified: Secondary | ICD-10-CM | POA: Diagnosis not present

## 2022-06-30 DIAGNOSIS — M25611 Stiffness of right shoulder, not elsewhere classified: Secondary | ICD-10-CM | POA: Diagnosis not present

## 2022-06-30 DIAGNOSIS — M25511 Pain in right shoulder: Secondary | ICD-10-CM | POA: Diagnosis not present

## 2022-07-01 DIAGNOSIS — Z96611 Presence of right artificial shoulder joint: Secondary | ICD-10-CM | POA: Diagnosis not present

## 2022-07-01 DIAGNOSIS — Z471 Aftercare following joint replacement surgery: Secondary | ICD-10-CM | POA: Diagnosis not present

## 2022-07-02 DIAGNOSIS — M25511 Pain in right shoulder: Secondary | ICD-10-CM | POA: Diagnosis not present

## 2022-07-02 DIAGNOSIS — M25611 Stiffness of right shoulder, not elsewhere classified: Secondary | ICD-10-CM | POA: Diagnosis not present

## 2022-08-21 DIAGNOSIS — Z794 Long term (current) use of insulin: Secondary | ICD-10-CM | POA: Diagnosis not present

## 2022-08-21 DIAGNOSIS — M25519 Pain in unspecified shoulder: Secondary | ICD-10-CM | POA: Diagnosis not present

## 2022-08-21 DIAGNOSIS — I10 Essential (primary) hypertension: Secondary | ICD-10-CM | POA: Diagnosis not present

## 2022-08-21 DIAGNOSIS — N1832 Chronic kidney disease, stage 3b: Secondary | ICD-10-CM | POA: Diagnosis not present

## 2022-08-21 DIAGNOSIS — R7989 Other specified abnormal findings of blood chemistry: Secondary | ICD-10-CM | POA: Diagnosis not present

## 2022-08-21 DIAGNOSIS — D692 Other nonthrombocytopenic purpura: Secondary | ICD-10-CM | POA: Diagnosis not present

## 2022-08-21 DIAGNOSIS — E785 Hyperlipidemia, unspecified: Secondary | ICD-10-CM | POA: Diagnosis not present

## 2022-08-21 DIAGNOSIS — E1169 Type 2 diabetes mellitus with other specified complication: Secondary | ICD-10-CM | POA: Diagnosis not present

## 2022-08-21 DIAGNOSIS — N401 Enlarged prostate with lower urinary tract symptoms: Secondary | ICD-10-CM | POA: Diagnosis not present

## 2022-09-04 DIAGNOSIS — R7989 Other specified abnormal findings of blood chemistry: Secondary | ICD-10-CM | POA: Diagnosis not present

## 2022-09-04 DIAGNOSIS — I10 Essential (primary) hypertension: Secondary | ICD-10-CM | POA: Diagnosis not present

## 2022-09-09 DIAGNOSIS — D225 Melanocytic nevi of trunk: Secondary | ICD-10-CM | POA: Diagnosis not present

## 2022-09-09 DIAGNOSIS — D485 Neoplasm of uncertain behavior of skin: Secondary | ICD-10-CM | POA: Diagnosis not present

## 2022-09-09 DIAGNOSIS — L570 Actinic keratosis: Secondary | ICD-10-CM | POA: Diagnosis not present

## 2022-09-09 DIAGNOSIS — Z85828 Personal history of other malignant neoplasm of skin: Secondary | ICD-10-CM | POA: Diagnosis not present

## 2022-09-09 DIAGNOSIS — D0461 Carcinoma in situ of skin of right upper limb, including shoulder: Secondary | ICD-10-CM | POA: Diagnosis not present

## 2022-09-09 DIAGNOSIS — L821 Other seborrheic keratosis: Secondary | ICD-10-CM | POA: Diagnosis not present

## 2022-09-09 DIAGNOSIS — L814 Other melanin hyperpigmentation: Secondary | ICD-10-CM | POA: Diagnosis not present

## 2022-09-09 DIAGNOSIS — C44321 Squamous cell carcinoma of skin of nose: Secondary | ICD-10-CM | POA: Diagnosis not present

## 2022-10-13 ENCOUNTER — Encounter (INDEPENDENT_AMBULATORY_CARE_PROVIDER_SITE_OTHER): Payer: Medicare HMO | Admitting: Ophthalmology

## 2022-11-17 DIAGNOSIS — Z85828 Personal history of other malignant neoplasm of skin: Secondary | ICD-10-CM | POA: Diagnosis not present

## 2022-11-17 DIAGNOSIS — H43811 Vitreous degeneration, right eye: Secondary | ICD-10-CM | POA: Diagnosis not present

## 2022-11-17 DIAGNOSIS — H40013 Open angle with borderline findings, low risk, bilateral: Secondary | ICD-10-CM | POA: Diagnosis not present

## 2022-11-17 DIAGNOSIS — H35342 Macular cyst, hole, or pseudohole, left eye: Secondary | ICD-10-CM | POA: Diagnosis not present

## 2022-11-17 DIAGNOSIS — C44321 Squamous cell carcinoma of skin of nose: Secondary | ICD-10-CM | POA: Diagnosis not present

## 2022-11-17 DIAGNOSIS — H2511 Age-related nuclear cataract, right eye: Secondary | ICD-10-CM | POA: Diagnosis not present

## 2022-11-24 DIAGNOSIS — L988 Other specified disorders of the skin and subcutaneous tissue: Secondary | ICD-10-CM | POA: Diagnosis not present

## 2022-11-24 DIAGNOSIS — D485 Neoplasm of uncertain behavior of skin: Secondary | ICD-10-CM | POA: Diagnosis not present

## 2022-11-24 DIAGNOSIS — Z85828 Personal history of other malignant neoplasm of skin: Secondary | ICD-10-CM | POA: Diagnosis not present

## 2022-12-11 DIAGNOSIS — Z96611 Presence of right artificial shoulder joint: Secondary | ICD-10-CM | POA: Diagnosis not present

## 2023-01-14 DIAGNOSIS — D2361 Other benign neoplasm of skin of right upper limb, including shoulder: Secondary | ICD-10-CM | POA: Diagnosis not present

## 2023-01-14 DIAGNOSIS — D2262 Melanocytic nevi of left upper limb, including shoulder: Secondary | ICD-10-CM | POA: Diagnosis not present

## 2023-01-14 DIAGNOSIS — Z85828 Personal history of other malignant neoplasm of skin: Secondary | ICD-10-CM | POA: Diagnosis not present

## 2023-01-14 DIAGNOSIS — L814 Other melanin hyperpigmentation: Secondary | ICD-10-CM | POA: Diagnosis not present

## 2023-01-14 DIAGNOSIS — L570 Actinic keratosis: Secondary | ICD-10-CM | POA: Diagnosis not present

## 2023-01-14 DIAGNOSIS — L821 Other seborrheic keratosis: Secondary | ICD-10-CM | POA: Diagnosis not present

## 2023-01-14 DIAGNOSIS — D225 Melanocytic nevi of trunk: Secondary | ICD-10-CM | POA: Diagnosis not present

## 2023-01-27 DIAGNOSIS — Z961 Presence of intraocular lens: Secondary | ICD-10-CM | POA: Diagnosis not present

## 2023-01-27 DIAGNOSIS — E119 Type 2 diabetes mellitus without complications: Secondary | ICD-10-CM | POA: Diagnosis not present

## 2023-01-27 DIAGNOSIS — D3132 Benign neoplasm of left choroid: Secondary | ICD-10-CM | POA: Diagnosis not present

## 2023-01-27 DIAGNOSIS — H2511 Age-related nuclear cataract, right eye: Secondary | ICD-10-CM | POA: Diagnosis not present

## 2023-02-01 DIAGNOSIS — H2511 Age-related nuclear cataract, right eye: Secondary | ICD-10-CM | POA: Diagnosis not present

## 2023-02-04 DIAGNOSIS — H2511 Age-related nuclear cataract, right eye: Secondary | ICD-10-CM | POA: Diagnosis not present

## 2023-02-24 ENCOUNTER — Encounter: Payer: Self-pay | Admitting: Gastroenterology

## 2023-03-09 DIAGNOSIS — N1832 Chronic kidney disease, stage 3b: Secondary | ICD-10-CM | POA: Diagnosis not present

## 2023-03-09 DIAGNOSIS — Z125 Encounter for screening for malignant neoplasm of prostate: Secondary | ICD-10-CM | POA: Diagnosis not present

## 2023-03-09 DIAGNOSIS — R739 Hyperglycemia, unspecified: Secondary | ICD-10-CM | POA: Diagnosis not present

## 2023-03-09 DIAGNOSIS — I10 Essential (primary) hypertension: Secondary | ICD-10-CM | POA: Diagnosis not present

## 2023-03-09 DIAGNOSIS — E781 Pure hyperglyceridemia: Secondary | ICD-10-CM | POA: Diagnosis not present

## 2023-03-09 DIAGNOSIS — E1169 Type 2 diabetes mellitus with other specified complication: Secondary | ICD-10-CM | POA: Diagnosis not present

## 2023-03-09 DIAGNOSIS — E538 Deficiency of other specified B group vitamins: Secondary | ICD-10-CM | POA: Diagnosis not present

## 2023-03-09 DIAGNOSIS — R946 Abnormal results of thyroid function studies: Secondary | ICD-10-CM | POA: Diagnosis not present

## 2023-03-09 DIAGNOSIS — R7989 Other specified abnormal findings of blood chemistry: Secondary | ICD-10-CM | POA: Diagnosis not present

## 2023-03-15 DIAGNOSIS — E1122 Type 2 diabetes mellitus with diabetic chronic kidney disease: Secondary | ICD-10-CM | POA: Diagnosis not present

## 2023-03-16 DIAGNOSIS — I10 Essential (primary) hypertension: Secondary | ICD-10-CM | POA: Diagnosis not present

## 2023-03-16 DIAGNOSIS — N1832 Chronic kidney disease, stage 3b: Secondary | ICD-10-CM | POA: Diagnosis not present

## 2023-03-16 DIAGNOSIS — E1169 Type 2 diabetes mellitus with other specified complication: Secondary | ICD-10-CM | POA: Diagnosis not present

## 2023-03-16 DIAGNOSIS — E1122 Type 2 diabetes mellitus with diabetic chronic kidney disease: Secondary | ICD-10-CM | POA: Diagnosis not present

## 2023-03-16 DIAGNOSIS — I129 Hypertensive chronic kidney disease with stage 1 through stage 4 chronic kidney disease, or unspecified chronic kidney disease: Secondary | ICD-10-CM | POA: Diagnosis not present

## 2023-03-16 DIAGNOSIS — D692 Other nonthrombocytopenic purpura: Secondary | ICD-10-CM | POA: Diagnosis not present

## 2023-03-16 DIAGNOSIS — I77810 Thoracic aortic ectasia: Secondary | ICD-10-CM | POA: Diagnosis not present

## 2023-03-16 DIAGNOSIS — E785 Hyperlipidemia, unspecified: Secondary | ICD-10-CM | POA: Diagnosis not present

## 2023-03-16 DIAGNOSIS — E039 Hypothyroidism, unspecified: Secondary | ICD-10-CM | POA: Diagnosis not present

## 2023-03-19 ENCOUNTER — Encounter: Payer: Self-pay | Admitting: Gastroenterology

## 2023-04-05 DIAGNOSIS — Z1331 Encounter for screening for depression: Secondary | ICD-10-CM | POA: Diagnosis not present

## 2023-04-05 DIAGNOSIS — Z0000 Encounter for general adult medical examination without abnormal findings: Secondary | ICD-10-CM | POA: Diagnosis not present

## 2023-04-05 DIAGNOSIS — Z1339 Encounter for screening examination for other mental health and behavioral disorders: Secondary | ICD-10-CM | POA: Diagnosis not present

## 2023-05-06 DIAGNOSIS — E785 Hyperlipidemia, unspecified: Secondary | ICD-10-CM | POA: Diagnosis not present

## 2023-05-06 DIAGNOSIS — E538 Deficiency of other specified B group vitamins: Secondary | ICD-10-CM | POA: Diagnosis not present

## 2023-05-06 DIAGNOSIS — Z794 Long term (current) use of insulin: Secondary | ICD-10-CM | POA: Diagnosis not present

## 2023-05-06 DIAGNOSIS — E1122 Type 2 diabetes mellitus with diabetic chronic kidney disease: Secondary | ICD-10-CM | POA: Diagnosis not present

## 2023-05-06 DIAGNOSIS — I129 Hypertensive chronic kidney disease with stage 1 through stage 4 chronic kidney disease, or unspecified chronic kidney disease: Secondary | ICD-10-CM | POA: Diagnosis not present

## 2023-05-06 DIAGNOSIS — I77810 Thoracic aortic ectasia: Secondary | ICD-10-CM | POA: Diagnosis not present

## 2023-05-06 DIAGNOSIS — N1832 Chronic kidney disease, stage 3b: Secondary | ICD-10-CM | POA: Diagnosis not present

## 2023-05-06 DIAGNOSIS — E039 Hypothyroidism, unspecified: Secondary | ICD-10-CM | POA: Diagnosis not present

## 2023-06-30 ENCOUNTER — Encounter: Payer: Self-pay | Admitting: Gastroenterology

## 2023-06-30 ENCOUNTER — Ambulatory Visit: Payer: Medicare HMO | Admitting: Gastroenterology

## 2023-06-30 VITALS — BP 118/62 | HR 68 | Ht 75.0 in | Wt 214.0 lb

## 2023-06-30 DIAGNOSIS — K862 Cyst of pancreas: Secondary | ICD-10-CM | POA: Diagnosis not present

## 2023-06-30 DIAGNOSIS — Z86010 Personal history of colonic polyps: Secondary | ICD-10-CM

## 2023-06-30 MED ORDER — NA SULFATE-K SULFATE-MG SULF 17.5-3.13-1.6 GM/177ML PO SOLN: 1.0000 | ORAL | 0 refills | Status: DC

## 2023-06-30 NOTE — Patient Instructions (Addendum)
You have been scheduled for an MRI/MRCP at [facility] on 07/16/23. Your appointment time is 9:00 am . Please arrive to admitting (at main entrance of the hospital) 30 minutes prior to your appointment time for registration purposes. Please make certain not to have anything to eat or drink 6 hours prior to your test. In addition, if you have any metal in your body, have a pacemaker or defibrillator, please be sure to let your ordering physician know. This test typically takes 45 minutes to 1 hour to complete. Should you need to reschedule, please call [phone] to do so.  We have sent the following medications to your pharmacy for you to pick up at your convenience: Suprep   You have been scheduled for a colonoscopy. Please follow written instructions given to you at your visit today.   Please pick up your prep supplies at the pharmacy within the next 1-3 days.  If you use inhalers (even only as needed), please bring them with you on the day of your procedure.  DO NOT TAKE 7 DAYS PRIOR TO TEST- Trulicity (dulaglutide) Ozempic, Wegovy (semaglutide) Mounjaro (tirzepatide) Bydureon Bcise (exanatide extended release)  DO NOT TAKE 1 DAY PRIOR TO YOUR TEST Rybelsus (semaglutide) Adlyxin (lixisenatide) Victoza (liraglutide) Byetta (exanatide) ___________________________________________________________________________   If your blood pressure at your visit was 140/90 or greater, please contact your primary care physician to follow up on this.  _______________________________________________________  If you are age 60 or older, your body mass index should be between 23-30. Your Body mass index is 26.75 kg/m. If this is out of the aforementioned range listed, please consider follow up with your Primary Care Provider.  If you are age 67 or younger, your body mass index should be between 19-25. Your Body mass index is 26.75 kg/m. If this is out of the aformentioned range listed, please consider  follow up with your Primary Care Provider.   ________________________________________________________  The [facility] providers would like to encourage you to use [facility] to communicate with providers for non-urgent requests or questions.  Due to long hold times on the telephone, sending your provider a message by [facility] may be a faster and more efficient way to get a response.  Please allow 48 business hours for a response.  Please remember that this is for non-urgent requests.  _______________________________________________________  Due to recent changes in healthcare laws, you may see the results of your imaging and laboratory studies on MyChart before your provider has had a chance to review them.  We understand that in some cases there may be results that are confusing or concerning to you. Not all laboratory results come back in the same time frame and the provider may be waiting for multiple results in order to interpret others.  Please give [place] 48 hours in order for your provider to thoroughly review all the results before contacting the office for clarification of your results.    Thank you for choosing me and [facility].  Dr. Meridee Score

## 2023-06-30 NOTE — Progress Notes (Signed)
GASTROENTEROLOGY OUTPATIENT CLINIC VISIT   Primary Care Provider Charlane Ferretti, DO [address] [place] [address] [phone]  Referring Provider Charlane Ferretti, DO [address] [place],  [place] [address] [phone]  Patient Profile: Maurice Perkins is a 77 y.o. male with a pmh significant for hypertension, hyperlipidemia, diabetes, CRI, MCN of the pancreas, Diverticulosis, colon polyps (TA's).  The patient presents to the [facility] for an evaluation and management of problem(s) noted below:  Problem List 1. Hx of adenomatous colonic polyps   2. Pancreatic cyst     History of Present Illness This is a patient who is seen for follow-up to discuss the indication of continued colon cancer screening and colon polyp surveillance.  He is a previous patient of Dr. Christella Hartigan.  Patient was last seen at the time of his colonoscopy in 2018 where multiple adenomas were found as well as diverticulosis when he had issues of bleeding.  He has done well since then.  He has a previous history of a mucinous cystic neoplasm of the pancreas that was monitored for which she ended up seeing Dr. Donell Beers and decision was made to hold off on surgical management.  He is however not had any repeat imaging over the course of the last few years.  He is not having any other significant GI symptoms at this time or changes in his bowel habits.  GI Review of Systems Positive as above Negative for pyrosis, dysphagia, odynophagia, pain, nausea, vomiting, melena, hematochezia  Review of Systems General: Denies fevers/chills/weight loss unintentionally Cardiovascular: Denies chest pain Pulmonary: Denies shortness of breath Gastroenterological: See HPI Genitourinary: Denies darkened urine Hematological: Denies easy bruising/bleeding Dermatological: Denies jaundice Psychological: Mood is stable   Medications Current Outpatient Medications  Medication Sig Dispense Refill   albuterol  (VENTOLIN HFA) 108 (90 Base) MCG/ACT inhaler Inhale 1-2 puffs into the lungs every 6 (six) hours as needed for wheezing or shortness of breath.     aspirin EC 81 MG tablet Take 81 mg by mouth daily.     atenolol (TENORMIN) 50 MG tablet Take 25 mg by mouth every morning.      atorvastatin (LIPITOR) 40 MG tablet Take 40 mg by mouth daily.     cyclobenzaprine (FLEXERIL) 10 MG tablet Take 1 tablet (10 mg total) by mouth 3 (three) times daily as needed for muscle spasms. 30 tablet 1   Emollient (CERAVE DAILY MOISTURIZING EX) Apply 1 application topically daily as needed (dry skin).     empagliflozin (JARDIANCE) 10 MG TABS tablet Take 10 mg by mouth daily.     fluticasone (FLONASE) 50 MCG/ACT nasal spray Place 1 spray into both nostrils daily as needed for allergies or rhinitis.     HYDROmorphone (DILAUDID) 2 MG tablet Take 1 tablet (2 mg total) by mouth every 4 (four) hours as needed. 10 tablet 0   insulin aspart (NOVOLOG) 100 UNIT/ML injection Inject 25 Units into the skin 3 (three) times daily with meals.     insulin glargine, 2 Unit Dial, (TOUJEO MAX SOLOSTAR) 300 UNIT/ML Solostar Pen Inject 80 Units into the skin daily.     losartan (COZAAR) 50 MG tablet Take 50 mg by mouth daily.     metFORMIN (GLUCOPHAGE-XR) 500 MG 24 hr tablet Take 500-1,000 mg by mouth See admin instructions. Take 1000 mg by mouth at noon and 500 mg in the evening     Misc Natural Products (GLUCOSAMINE CHONDROITIN MSM PO) Take 2 tablets by mouth daily.  Multiple Vitamin (MULTIVITAMIN WITH MINERALS) TABS tablet Take 1 tablet by mouth daily.     Na Sulfate-K Sulfate-Mg Sulf (SUPREP BOWEL PREP KIT) 17.5-3.13-1.6 GM/177ML SOLN Take 1 kit by mouth as directed. For colonoscopy prep 354 mL 0   naproxen (NAPROSYN) 500 MG tablet Take 1 tablet (500 mg total) by mouth 2 (two) times daily with a meal. 60 tablet 1   nitroGLYCERIN (NITROSTAT) 0.4 MG SL tablet Place 0.4 mg under the tongue every 5 (five) minutes as needed for chest pain.      ondansetron (ZOFRAN) 4 MG tablet Take 1 tablet (4 mg total) by mouth every 8 (eight) hours as needed for nausea or vomiting. 10 tablet 0   tetrahydrozoline 0.05 % ophthalmic solution Place 1-3 drops into both eyes 3 (three) times daily as needed (for itchy/scratchy eyes).     No current facility-administered medications for this visit.    Allergies Allergies  Allergen Reactions   Oxycodone Nausea Only   Penicillins Hives    Has patient had a PCN reaction causing immediate rash, facial/tongue/throat swelling, SOB or lightheadedness with hypotension:Yes Has patient had a PCN reaction causing severe rash involving mucus membranes or skin necrosis:unsure Has patient had a PCN reaction that required hospitalization:no Has patient had a PCN reaction occurring within the last 10 years:no If all of the above answers are "NO", then may proceed with Cephalosporin use. Tolerated Ancef 04-02-22    Hydrocodone Nausea Only   Levofloxacin     Other reaction(s): tingling all over the body    Histories Past Medical History:  Diagnosis Date   CKD (chronic kidney disease) stage 3, GFR 30-59 ml/min (HCC)    Diabetes (HCC)    insulin requiring type 2.    Dry skin    pt has skin oil deficiency ischtiosis   Hyperlipidemia    Hypertension    Other general symptoms(780.99)    Pancreatic mass 2015   cystic mucinous neoplasm. opted for non-surgical observation. follwed with serial MRI/MRCP.     Past Surgical History:  Procedure Laterality Date   ABDOMINAL EXPLORATION SURGERY  09/25/2015   CATARACT EXTRACTION W/ INTRAOCULAR LENS IMPLANT Left    COLONOSCOPY     2004 dr Kinnie Scales   COLONOSCOPY N/A 11/20/2017   Procedure: COLONOSCOPY;  Surgeon: Rachael Fee, MD;  Location: [facility] ENDOSCOPY;  Service: Endoscopy;  Laterality: N/A;   EUS N/A 05/17/2014   Procedure: UPPER ENDOSCOPIC ULTRASOUND (EUS) LINEAR;  Surgeon: Rachael Fee, MD;  Location: [facility] ENDOSCOPY;  Service: Endoscopy;  Laterality: N/A;    EYE SURGERY Left    Retina repair   INSERTION OF MESH N/A 09/30/2016   Procedure: INSERTION OF MESH;  Surgeon: Almond Lint, MD;  Location: [facility] OR;  Service: General;  Laterality: N/A;   LAPAROTOMY N/A 09/25/2015   Procedure: EXPLORATORY LAPAROTOMY small bowel resection;  Surgeon: Almond Lint, MD;  Location: [facility] OR;  Service: General;  Laterality: N/A;   NASAL SINUS SURGERY  1970's   pilonodal cyst     in high school-bottom of spine   REVERSE SHOULDER ARTHROPLASTY Right 04/02/2022   Procedure: REVERSE SHOULDER ARTHROPLASTY;  Surgeon: Francena Hanly, MD;  Location: [facility] ORS;  Service: Orthopedics;  Laterality: Right;   SHOULDER SURGERY Right 2000   VENTRAL HERNIA REPAIR  09/30/2016   LAPROSCOPIC   VENTRAL HERNIA REPAIR N/A 09/30/2016   Procedure: LAPAROSCOPIC VENTRAL HERNIA;  Surgeon: Almond Lint, MD;  Location: [facility] OR;  Service: General;  Laterality: N/A;   Social History   Socioeconomic  History   Marital status: Married    Spouse name: Not on file   Number of children: 4   Years of education: Not on file   Highest education level: Not on file  Occupational History   Occupation: Technical brewer  Tobacco Use   Smoking status: Former    Current packs/day: 0.00    Average packs/day: 2.0 packs/day for 15.0 years (30.0 ttl pk-yrs)    Types: Cigarettes    Start date: 12/14/1956    Quit date: 12/15/1971    Years since quitting: 51.5   Smokeless tobacco: Never  Vaping Use   Vaping status: Never Used  Substance and Sexual Activity   Alcohol use: No    Alcohol/week: 0.0 standard drinks of alcohol   Drug use: No   Sexual activity: Not on file  Other Topics Concern   Not on file  Social History Narrative   Not on file   Social Determinants of Health   Financial Resource Strain: Not on file  Food Insecurity: Not on file  Transportation Needs: Not on file  Physical Activity: Not on file  Stress: Not on file  Social Connections: Not on file  Intimate Partner Violence: Not on file    Family History  Problem Relation Age of Onset   Heart disease Mother    Breast cancer Mother        breast   Colon cancer Neg Hx    Esophageal cancer Neg Hx    Ulcerative colitis Neg Hx    Inflammatory bowel disease Neg Hx    Liver disease Neg Hx    Pancreatic cancer Neg Hx    Rectal cancer Neg Hx    Stomach cancer Neg Hx    I have reviewed his medical, social, and family history in detail and updated the electronic medical record as necessary.    PHYSICAL EXAMINATION  BP 118/62   Pulse 68   Ht 6\' 3"  (1.905 m)   Wt 214 lb (97.1 kg)   BMI 26.75 kg/m  Wt Readings from Last 3 Encounters:  06/30/23 214 lb (97.1 kg)  04/02/22 211 lb 9.6 oz (96 kg)  03/23/22 211 lb 9.6 oz (96 kg)  GEN: NAD, appears stated age, doesn't appear chronically ill PSYCH: Cooperative, without pressured speech EYE: Conjunctivae pink, sclerae anicteric ENT: MMM CV: Nontachycardic RESP: No audible wheezing GI: NABS, soft, NT/ND, without rebound or guarding MSK/EXT: No significant lower extremity edema SKIN: No jaundice NEURO:  Alert & Oriented x 3, no focal deficits   REVIEW OF DATA  I reviewed the following data at the time of this encounter:  GI Procedures and Studies  2018 colonoscopy - Five 2 to 4 mm polyps in the rectum, in the descending colon, in the transverse colon, in the ascending colon and in the cecum, removed with a cold snare. Resected and retrieved. - Diverticulosis in the entire examined colon. This is the likely source of your recent bleeding which has clearly stopped. - The examination was otherwise normal on direct and retroflexion views.   Laboratory Studies  Reviewed those in epic  Imaging Studies  September 2017 MRI/MRCP IMPRESSION: 1. Stable 3.0 x 2.7 cm multi septated cystic lesion in the left mid process of the pancreatic head. No change since 2015. Recommend 1 year followup MRI x2. This recommendation follows ACR consensus guidelines: Management of Incidental  Pancreatic Cysts: A White Paper of the ACR Incidental Findings Committee. J Am Coll Radiol 2017;14:911-923. 2. Stable hepatic and renal cysts.  ASSESSMENT  Maurice Perkins is a 77 y.o. male with a pmh significant for hypertension, hyperlipidemia, diabetes, CRI, MCN of the pancreas, Diverticulosis, colon polyps (TA's).  The patient is seen today for evaluation and management of:  1. Hx of adenomatous colonic polyps   2. Pancreatic cyst    The patient is hemodynamically and clinically stable at this time.  The patient remains a viable candidate for colon polyp surveillance and colon cancer screening based on his age and other medical comorbidities.  The risks and benefits of endoscopic evaluation were discussed with the patient; these include but are not limited to the risk of perforation, infection, bleeding, missed lesions, lack of diagnosis, severe illness requiring hospitalization, as well as anesthesia and sedation related illnesses.  The patient and/or family is agreeable to proceed.  The patient had a previous diagnosis of mucinous cystic neoplasm of the pancreas though has not been reimaged in over 7 years, as he still remains a candidate for colon cancer screening I think we should at least consider a repeat upper abdomen evaluation with MRI/MRCP to see if anything is changed or developed where in which he may require additional therapies or repeat endoscopic ultrasound.  Hopefully that is not the case.  All patient questions were answered to the best of my ability, and the patient agrees to the aforementioned plan of action with follow-up as indicated.   PLAN  Proceed with scheduling colonoscopy for colon polyp surveillance MRI/MRCP for follow-up of pancreatic cystic neoplasm   Orders Placed This Encounter  Procedures   MR ABDOMEN MRCP W WO CONTAST   Ambulatory referral to Gastroenterology    New Prescriptions   NA SULFATE-K SULFATE-MG SULF (SUPREP BOWEL PREP KIT) 17.5-3.13-1.6  GM/177ML SOLN    Take 1 kit by mouth as directed. For colonoscopy prep   Modified Medications   No medications on file    Planned Follow Up No follow-ups on file.   Total Time in Face-to-Face and in Coordination of Care for patient including independent/personal interpretation/review of prior testing, medical history, examination, medication adjustment, communicating results with the patient directly, and documentation within the EHR is 35 minutes.   Corliss Parish, MD [facility] Advanced Endoscopy Office # [phone]

## 2023-07-03 ENCOUNTER — Encounter: Payer: Self-pay | Admitting: Gastroenterology

## 2023-07-03 DIAGNOSIS — K862 Cyst of pancreas: Secondary | ICD-10-CM | POA: Insufficient documentation

## 2023-07-03 DIAGNOSIS — Z86010 Personal history of colonic polyps: Secondary | ICD-10-CM | POA: Insufficient documentation

## 2023-07-16 ENCOUNTER — Ambulatory Visit (HOSPITAL_COMMUNITY): Payer: Medicare HMO

## 2023-07-20 DIAGNOSIS — L570 Actinic keratosis: Secondary | ICD-10-CM | POA: Diagnosis not present

## 2023-07-20 DIAGNOSIS — L814 Other melanin hyperpigmentation: Secondary | ICD-10-CM | POA: Diagnosis not present

## 2023-07-20 DIAGNOSIS — C44519 Basal cell carcinoma of skin of other part of trunk: Secondary | ICD-10-CM | POA: Diagnosis not present

## 2023-07-20 DIAGNOSIS — L820 Inflamed seborrheic keratosis: Secondary | ICD-10-CM | POA: Diagnosis not present

## 2023-07-20 DIAGNOSIS — D0461 Carcinoma in situ of skin of right upper limb, including shoulder: Secondary | ICD-10-CM | POA: Diagnosis not present

## 2023-07-20 DIAGNOSIS — C4359 Malignant melanoma of other part of trunk: Secondary | ICD-10-CM | POA: Diagnosis not present

## 2023-07-20 DIAGNOSIS — C44612 Basal cell carcinoma of skin of right upper limb, including shoulder: Secondary | ICD-10-CM | POA: Diagnosis not present

## 2023-07-20 DIAGNOSIS — L821 Other seborrheic keratosis: Secondary | ICD-10-CM | POA: Diagnosis not present

## 2023-07-20 DIAGNOSIS — Z85828 Personal history of other malignant neoplasm of skin: Secondary | ICD-10-CM | POA: Diagnosis not present

## 2023-07-22 ENCOUNTER — Other Ambulatory Visit: Payer: Self-pay | Admitting: Gastroenterology

## 2023-07-22 ENCOUNTER — Ambulatory Visit (HOSPITAL_COMMUNITY)
Admission: RE | Admit: 2023-07-22 | Discharge: 2023-07-22 | Disposition: A | Discharge status: Home / Self Care | Payer: Medicare HMO | Source: Ambulatory Visit | Attending: Gastroenterology | Admitting: Gastroenterology

## 2023-07-22 DIAGNOSIS — Z86010 Personal history of colonic polyps: Secondary | ICD-10-CM | POA: Insufficient documentation

## 2023-07-22 DIAGNOSIS — K862 Cyst of pancreas: Secondary | ICD-10-CM | POA: Insufficient documentation

## 2023-07-22 DIAGNOSIS — I700 Atherosclerosis of aorta: Secondary | ICD-10-CM | POA: Diagnosis not present

## 2023-07-22 DIAGNOSIS — K8689 Other specified diseases of pancreas: Secondary | ICD-10-CM | POA: Diagnosis not present

## 2023-07-22 DIAGNOSIS — N281 Cyst of kidney, acquired: Secondary | ICD-10-CM | POA: Diagnosis not present

## 2023-07-22 MED ORDER — GADOBUTROL 1 MMOL/ML IV SOLN
10.0000 mL | Freq: Once | INTRAVENOUS | Status: AC | PRN
  Administered 2023-07-22: 10 mL via INTRAVENOUS

## 2023-07-27 DIAGNOSIS — N1832 Chronic kidney disease, stage 3b: Secondary | ICD-10-CM | POA: Diagnosis not present

## 2023-07-27 DIAGNOSIS — E1169 Type 2 diabetes mellitus with other specified complication: Secondary | ICD-10-CM | POA: Diagnosis not present

## 2023-07-27 DIAGNOSIS — E781 Pure hyperglyceridemia: Secondary | ICD-10-CM | POA: Diagnosis not present

## 2023-07-27 DIAGNOSIS — Z794 Long term (current) use of insulin: Secondary | ICD-10-CM | POA: Diagnosis not present

## 2023-07-27 DIAGNOSIS — I10 Essential (primary) hypertension: Secondary | ICD-10-CM | POA: Diagnosis not present

## 2023-08-12 DIAGNOSIS — Z85828 Personal history of other malignant neoplasm of skin: Secondary | ICD-10-CM | POA: Diagnosis not present

## 2023-08-12 DIAGNOSIS — C4359 Malignant melanoma of other part of trunk: Secondary | ICD-10-CM | POA: Diagnosis not present

## 2023-08-12 DIAGNOSIS — Z85820 Personal history of malignant melanoma of skin: Secondary | ICD-10-CM | POA: Diagnosis not present

## 2023-08-12 DIAGNOSIS — L988 Other specified disorders of the skin and subcutaneous tissue: Secondary | ICD-10-CM | POA: Diagnosis not present

## 2023-08-23 ENCOUNTER — Encounter: Payer: Self-pay | Admitting: Gastroenterology

## 2023-09-06 ENCOUNTER — Telehealth: Payer: Self-pay | Admitting: Gastroenterology

## 2023-09-06 NOTE — Telephone Encounter (Signed)
Took inbound call from Designer, television/film set.  Pt stated that he drank a 12 oz cheerwine today at 11:30 am.  Pt wanted to know if he should cancel his colonoscopy.  Spoke with charge RN, Estella Husk who consulted with Dr. Rhea Belton.  Per Dr. Rhea Belton, pt can proceed with scheduled colonoscopy.  Pt informed.

## 2023-09-06 NOTE — Telephone Encounter (Signed)
Inbound call from patient stating he drank a cheerwine drink at 11:30 this morning. Patient requesting a call back to be advised on colonoscopy scheduled for tomorrow 9/24. Please advise, thank you.

## 2023-09-07 ENCOUNTER — Encounter: Payer: Self-pay | Admitting: Gastroenterology

## 2023-09-07 ENCOUNTER — Ambulatory Visit (AMBULATORY_SURGERY_CENTER): Payer: Medicare HMO | Admitting: Gastroenterology

## 2023-09-07 VITALS — BP 115/69 | HR 71 | Temp 96.6°F | Resp 17 | Ht 75.0 in | Wt 214.0 lb

## 2023-09-07 DIAGNOSIS — E119 Type 2 diabetes mellitus without complications: Secondary | ICD-10-CM | POA: Diagnosis not present

## 2023-09-07 DIAGNOSIS — D122 Benign neoplasm of ascending colon: Secondary | ICD-10-CM

## 2023-09-07 DIAGNOSIS — K635 Polyp of colon: Secondary | ICD-10-CM

## 2023-09-07 DIAGNOSIS — Z09 Encounter for follow-up examination after completed treatment for conditions other than malignant neoplasm: Secondary | ICD-10-CM | POA: Diagnosis not present

## 2023-09-07 DIAGNOSIS — Z86010 Personal history of colonic polyps: Secondary | ICD-10-CM | POA: Diagnosis not present

## 2023-09-07 DIAGNOSIS — I10 Essential (primary) hypertension: Secondary | ICD-10-CM | POA: Diagnosis not present

## 2023-09-07 DIAGNOSIS — D125 Benign neoplasm of sigmoid colon: Secondary | ICD-10-CM | POA: Diagnosis not present

## 2023-09-07 DIAGNOSIS — I2510 Atherosclerotic heart disease of native coronary artery without angina pectoris: Secondary | ICD-10-CM | POA: Diagnosis not present

## 2023-09-07 DIAGNOSIS — D123 Benign neoplasm of transverse colon: Secondary | ICD-10-CM

## 2023-09-07 MED ORDER — SODIUM CHLORIDE 0.9 % IV SOLN: 500.0000 mL | Freq: Once | INTRAVENOUS | Status: DC

## 2023-09-07 NOTE — Op Note (Signed)
[facility] Patient Name: Maurice Perkins Procedure Date: 09/07/2023 10:10 AM MRN: [id number] Endoscopist: Corliss Parish , MD, [id number] Age: 77 Referring MD:  Date of Birth: [birth year] Gender: Male Account #: [id number] Procedure:                Colonoscopy Indications:              Surveillance: Personal history of adenomatous                            polyps on last colonoscopy > 5 years ago Medicines:                Monitored Anesthesia Care Procedure:                Pre-Anesthesia Assessment:                           - Prior to the procedure, a History and Physical                            was performed, and patient medications and                            allergies were reviewed. The patient's tolerance of                            previous anesthesia was also reviewed. The risks                            and benefits of the procedure and the sedation                            options and risks were discussed with the patient.                            All questions were answered, and informed consent                            was obtained. Prior Anticoagulants: The patient has                            taken no anticoagulant or antiplatelet agents                            except for aspirin. ASA Grade Assessment: II - A                            patient with mild systemic disease. After reviewing                            the risks and benefits, the patient was deemed in                            satisfactory condition to undergo the procedure.  After obtaining informed consent, the colonoscope                            was passed under direct vision. Throughout the                            procedure, the patient's blood pressure, pulse, and                            oxygen saturations were monitored continuously. The                            Olympus CF-HQ190L [id number]) Colonoscope was                             introduced through the anus and advanced to the 3                            cm into the ileum. The colonoscopy was performed                            without difficulty. The patient tolerated the                            procedure. The quality of the bowel preparation was                            adequate. The terminal ileum, ileocecal valve,                            appendiceal orifice, and rectum were photographed. Scope In: 10:18:25 AM Scope Out: 10:34:43 AM Scope Withdrawal Time: 0 hours 13 minutes 57 seconds  Total Procedure Duration: 0 hours 16 minutes 18 seconds  Findings:                 The digital rectal exam findings include                            hemorrhoids. Pertinent negatives include no                            palpable rectal lesions.                           The terminal ileum and ileocecal valve appeared                            normal.                           Seven sessile polyps were found in the sigmoid                            colon (1), transverse colon (2) and ascending colon                            (  4). The polyps were 2 to 5 mm in size. These                            polyps were removed with a cold snare. Resection                            and retrieval were complete.                           Multiple small-mouthed diverticula were found in                            the recto-sigmoid colon, sigmoid colon and                            ascending colon.                           Normal mucosa was found in the entire colon                            otherwise.                           Non-bleeding non-thrombosed external and internal                            hemorrhoids were found during retroflexion, during                            perianal exam and during digital exam. The                            hemorrhoids were Grade II (internal hemorrhoids                            that prolapse but reduce  spontaneously). Complications:            No immediate complications. Estimated Blood Loss:     Estimated blood loss was minimal. Impression:               - Hemorrhoids found on digital rectal exam.                           - The examined portion of the ileum was normal.                           - Seven, 2 to 5 mm polyps in the sigmoid colon, in                            the transverse colon and in the ascending colon,                            removed with a cold snare. Resected and retrieved.                           -  Diverticulosis in the recto-sigmoid colon, in the                            sigmoid colon and in the ascending colon.                           - Normal mucosa in the entire examined colon                            otherwise.                           - Non-bleeding non-thrombosed external and internal                            hemorrhoids. Recommendation:           - The patient will be observed post-procedure,                            until all discharge criteria are met.                           - Discharge patient to home.                           - Patient has a contact number available for                            emergencies. The signs and symptoms of potential                            delayed complications were discussed with the                            patient. Return to normal activities tomorrow.                            Written discharge instructions were provided to the                            patient.                           - High fiber diet.                           - Use FiberCon 1-2 tablets PO daily.                           - Continue present medications.                           - Await pathology results.                           - Repeat colonoscopy in likely 3 years for  surveillance based on pathology results (patient                            will be over the age of 89 and we will need to  see                            him in clinic to discuss his medical history and                            comorbidities at the time but he feels as healthy                            as he is today expect another colonoscopy to be                            recommended at that time).                           - The findings and recommendations were discussed                            with the patient.                           - The findings and recommendations were discussed                            with the patient's family. Corliss Parish, MD 09/07/2023 10:39:56 AM

## 2023-09-07 NOTE — Progress Notes (Signed)
Called to room to assist during endoscopic procedure.  Patient ID and intended procedure confirmed with present staff. Received instructions for my participation in the procedure from the performing physician.  

## 2023-09-07 NOTE — Patient Instructions (Addendum)
Resume previous diet Continue present medications Await pathology results  Handouts/information given for polyps, diverticulosis and hemorrhoids  YOU HAD AN ENDOSCOPIC PROCEDURE TODAY AT THE [facility]:   Refer to the procedure report that was given to you for any specific questions about what was found during the examination.  If the procedure report does not answer your questions, please call your gastroenterologist to clarify.  If you requested that your care partner not be given the details of your procedure findings, then the procedure report has been included in a sealed envelope for you to review at your convenience later.  YOU SHOULD EXPECT: Some feelings of bloating in the abdomen. Passage of more gas than usual.  Walking can help get rid of the air that was put into your GI tract during the procedure and reduce the bloating. If you had a lower endoscopy (such as a colonoscopy or flexible sigmoidoscopy) you may notice spotting of blood in your stool or on the toilet paper. If you underwent a bowel prep for your procedure, you may not have a normal bowel movement for a few days.  Please Note:  You might notice some irritation and congestion in your nose or some drainage.  This is from the oxygen used during your procedure.  There is no need for concern and it should clear up in a day or so.  SYMPTOMS TO REPORT IMMEDIATELY:  Following lower endoscopy (colonoscopy):  Excessive amounts of blood in the stool  Significant tenderness or worsening of abdominal pains  Swelling of the abdomen that is new, acute  Fever of 100F or higher  For urgent or emergent issues, a gastroenterologist can be reached at any hour by calling [phone]. Do not use MyChart messaging for urgent concerns.   DIET:  We do recommend a small meal at first, but then you may proceed to your regular diet.  Drink plenty of fluids but you should avoid alcoholic beverages for 24 hours.  ACTIVITY:  You  should plan to take it easy for the rest of today and you should NOT DRIVE or use heavy machinery until tomorrow (because of the sedation medicines used during the test).    FOLLOW UP: Our staff will call the number listed on your records the next business day following your procedure.  We will call around 7:15- 8:00 am to check on you and address any questions or concerns that you may have regarding the information given to you following your procedure. If we do not reach you, we will leave a message.     If any biopsies were taken you will be contacted by phone or by letter within the next 1-3 weeks.  Please call us at [phone] if you have not heard about the biopsies in 3 weeks.    SIGNATURES/CONFIDENTIALITY: You and/or your care partner have signed paperwork which will be entered into your electronic medical record.  These signatures attest to the fact that that the information above on your After Visit Summary has been reviewed and is understood.  Full responsibility of the confidentiality of this discharge information lies with you and/or your care-partner. 

## 2023-09-07 NOTE — Progress Notes (Signed)
Report to PACU, RN, vss, BBS= Clear.  

## 2023-09-07 NOTE — Progress Notes (Signed)
GASTROENTEROLOGY PROCEDURE H&P NOTE   Primary Care Physician: Charlane Ferretti, DO  HPI: Maurice Perkins is a 77 y.o. male who presents for Colonoscopy for surveillance.  Past Medical History:  Diagnosis Date   CKD (chronic kidney disease) stage 3, GFR 30-59 ml/min (HCC)    Diabetes (HCC)    insulin requiring type 2.    Dry skin    pt has skin oil deficiency ischtiosis   Hyperlipidemia    Hypertension    Other general symptoms(780.99)    Pancreatic mass 2015   cystic mucinous neoplasm. opted for non-surgical observation. follwed with serial MRI/MRCP.     Past Surgical History:  Procedure Laterality Date   ABDOMINAL EXPLORATION SURGERY  09/25/2015   CATARACT EXTRACTION W/ INTRAOCULAR LENS IMPLANT Left    COLONOSCOPY     2004 dr Kinnie Scales   COLONOSCOPY N/A 11/20/2017   Procedure: COLONOSCOPY;  Surgeon: Rachael Fee, MD;  Location: [facility];  Service: Endoscopy;  Laterality: N/A;   EUS N/A 05/17/2014   Procedure: UPPER ENDOSCOPIC ULTRASOUND (EUS) LINEAR;  Surgeon: Rachael Fee, MD;  Location: [facility] END[facility]COPY;  Service: Endoscopy;  Laterality: N/A;   EYE SURGERY Left    Retina repair   INSERTION OF MESH N/A 09/30/2016   Procedure: INSERTION OF MESH;  Surgeon: Almond Lint, MD;  Location: [facility] OR;  Service: General;  Laterality: N/A;   LAPAROTOMY N/A 09/25/2015   Procedure: EXPLORATORY LAPAROTOMY small bowel resection;  Surgeon: Almond Lint, MD;  Location: [facility] OR;  Service: General;  Laterality: N/A;   NASAL SINUS SURGERY  1970's   pilonodal cyst     in high school-bottom of spine   REVERSE SHOULDER ARTHROPLASTY Right 04/02/2022   Procedure: REVERSE SHOULDER ARTHROPLASTY;  Surgeon: Francena Hanly, MD;  Location: [facility] ORS;  Service: Orthopedics;  Laterality: Right;   SHOULDER SURGERY Right 2000   VENTRAL HERNIA REPAIR  09/30/2016   LAPROSCOPIC   VENTRAL HERNIA REPAIR N/A 09/30/2016   Procedure: LAPAROSCOPIC VENTRAL HERNIA;  Surgeon: Almond Lint, MD;  Location: [facility] OR;   Service: General;  Laterality: N/A;   Current Outpatient Medications  Medication Sig Dispense Refill   albuterol (VENTOLIN HFA) 108 (90 Base) MCG/ACT inhaler Inhale 1-2 puffs into the lungs every 6 (six) hours as needed for wheezing or shortness of breath.     aspirin EC 81 MG tablet Take 81 mg by mouth daily.     atenolol (TENORMIN) 50 MG tablet Take 25 mg by mouth every morning.      atorvastatin (LIPITOR) 40 MG tablet Take 40 mg by mouth daily.     cyclobenzaprine (FLEXERIL) 10 MG tablet Take 1 tablet (10 mg total) by mouth 3 (three) times daily as needed for muscle spasms. 30 tablet 1   Emollient (CERAVE DAILY MOISTURIZING EX) Apply 1 application topically daily as needed (dry skin).     empagliflozin (JARDIANCE) 10 MG TABS tablet Take 10 mg by mouth daily.     fluticasone (FLONASE) 50 MCG/ACT nasal spray Place 1 spray into both nostrils daily as needed for allergies or rhinitis.     HYDROmorphone (DILAUDID) 2 MG tablet Take 1 tablet (2 mg total) by mouth every 4 (four) hours as needed. 10 tablet 0   insulin aspart (NOVOLOG) 100 UNIT/ML injection Inject 25 Units into the skin 3 (three) times daily with meals.     insulin glargine, 2 Unit Dial, (TOUJEO MAX SOLOSTAR) 300 UNIT/ML Solostar Pen Inject 80 Units into the skin daily.     losartan (  COZAAR) 50 MG tablet Take 50 mg by mouth daily.     metFORMIN (GLUCOPHAGE-XR) 500 MG 24 hr tablet Take 500-1,000 mg by mouth See admin instructions. Take 1000 mg by mouth at noon and 500 mg in the evening     Misc Natural Products (GLUCOSAMINE CHONDROITIN MSM PO) Take 2 tablets by mouth daily.     Multiple Vitamin (MULTIVITAMIN WITH MINERALS) TABS tablet Take 1 tablet by mouth daily.     Na Sulfate-K Sulfate-Mg Sulf (SUPREP BOWEL PREP KIT) 17.5-3.13-1.6 GM/177ML SOLN Take 1 kit by mouth as directed. For colonoscopy prep 354 mL 0   naproxen (NAPROSYN) 500 MG tablet Take 1 tablet (500 mg total) by mouth 2 (two) times daily with a meal. 60 tablet 1    nitroGLYCERIN (NITROSTAT) 0.4 MG SL tablet Place 0.4 mg under the tongue every 5 (five) minutes as needed for chest pain.     ondansetron (ZOFRAN) 4 MG tablet Take 1 tablet (4 mg total) by mouth every 8 (eight) hours as needed for nausea or vomiting. 10 tablet 0   tetrahydrozoline 0.05 % ophthalmic solution Place 1-3 drops into both eyes 3 (three) times daily as needed (for itchy/scratchy eyes).     Current Facility-Administered Medications  Medication Dose Route Frequency Provider Last Rate Last Admin   0.9 %  sodium chloride infusion  500 mL Intravenous Once Mansouraty, Netty Starring., MD        Current Outpatient Medications:    albuterol (VENTOLIN HFA) 108 (90 Base) MCG/ACT inhaler, Inhale 1-2 puffs into the lungs every 6 (six) hours as needed for wheezing or shortness of breath., Disp: , Rfl:    aspirin EC 81 MG tablet, Take 81 mg by mouth daily., Disp: , Rfl:    atenolol (TENORMIN) 50 MG tablet, Take 25 mg by mouth every morning. , Disp: , Rfl:    atorvastatin (LIPITOR) 40 MG tablet, Take 40 mg by mouth daily., Disp: , Rfl:    cyclobenzaprine (FLEXERIL) 10 MG tablet, Take 1 tablet (10 mg total) by mouth 3 (three) times daily as needed for muscle spasms., Disp: 30 tablet, Rfl: 1   Emollient (CERAVE DAILY MOISTURIZING EX), Apply 1 application topically daily as needed (dry skin)., Disp: , Rfl:    empagliflozin (JARDIANCE) 10 MG TABS tablet, Take 10 mg by mouth daily., Disp: , Rfl:    fluticasone (FLONASE) 50 MCG/ACT nasal spray, Place 1 spray into both nostrils daily as needed for allergies or rhinitis., Disp: , Rfl:    HYDROmorphone (DILAUDID) 2 MG tablet, Take 1 tablet (2 mg total) by mouth every 4 (four) hours as needed., Disp: 10 tablet, Rfl: 0   insulin aspart (NOVOLOG) 100 UNIT/ML injection, Inject 25 Units into the skin 3 (three) times daily with meals., Disp: , Rfl:    insulin glargine, 2 Unit Dial, (TOUJEO MAX SOLOSTAR) 300 UNIT/ML Solostar Pen, Inject 80 Units into the skin daily.,  Disp: , Rfl:    losartan (COZAAR) 50 MG tablet, Take 50 mg by mouth daily., Disp: , Rfl:    metFORMIN (GLUCOPHAGE-XR) 500 MG 24 hr tablet, Take 500-1,000 mg by mouth See admin instructions. Take 1000 mg by mouth at noon and 500 mg in the evening, Disp: , Rfl:    Misc Natural Products (GLUCOSAMINE CHONDROITIN MSM PO), Take 2 tablets by mouth daily., Disp: , Rfl:    Multiple Vitamin (MULTIVITAMIN WITH MINERALS) TABS tablet, Take 1 tablet by mouth daily., Disp: , Rfl:    Na Sulfate-K Sulfate-Mg Sulf (SUPREP BOWEL  PREP KIT) 17.5-3.13-1.6 GM/177ML SOLN, Take 1 kit by mouth as directed. For colonoscopy prep, Disp: 354 mL, Rfl: 0   naproxen (NAPROSYN) 500 MG tablet, Take 1 tablet (500 mg total) by mouth 2 (two) times daily with a meal., Disp: 60 tablet, Rfl: 1   nitroGLYCERIN (NITROSTAT) 0.4 MG SL tablet, Place 0.4 mg under the tongue every 5 (five) minutes as needed for chest pain., Disp: , Rfl:    ondansetron (ZOFRAN) 4 MG tablet, Take 1 tablet (4 mg total) by mouth every 8 (eight) hours as needed for nausea or vomiting., Disp: 10 tablet, Rfl: 0   tetrahydrozoline 0.05 % ophthalmic solution, Place 1-3 drops into both eyes 3 (three) times daily as needed (for itchy/scratchy eyes)., Disp: , Rfl:   Current Facility-Administered Medications:    0.9 %  sodium chloride infusion, 500 mL, Intravenous, Once, Mansouraty, Netty Starring., MD Allergies  Allergen Reactions   Oxycodone Nausea Only   Penicillins Hives    Has patient had a PCN reaction causing immediate rash, facial/tongue/throat swelling, SOB or lightheadedness with hypotension:Yes Has patient had a PCN reaction causing severe rash involving mucus membranes or skin necrosis:unsure Has patient had a PCN reaction that required hospitalization:no Has patient had a PCN reaction occurring within the last 10 years:no If all of the above answers are "NO", then may proceed with Cephalosporin use. Tolerated Ancef 04-02-22    Hydrocodone Nausea Only    Levofloxacin     Other reaction(s): tingling all over the body   Family History  Problem Relation Age of Onset   Heart disease Mother    Breast cancer Mother        breast   Colon cancer Neg Hx    Esophageal cancer Neg Hx    Ulcerative colitis Neg Hx    Inflammatory bowel disease Neg Hx    Liver disease Neg Hx    Pancreatic cancer Neg Hx    Rectal cancer Neg Hx    Stomach cancer Neg Hx    Social History   Socioeconomic History   Marital status: Married    Spouse name: Not on file   Number of children: 4   Years of education: Not on file   Highest education level: Not on file  Occupational History   Occupation: Technical brewer  Tobacco Use   Smoking status: Former    Current packs/day: 0.00    Average packs/day: 2.0 packs/day for 15.0 years (30.0 ttl pk-yrs)    Types: Cigarettes    Start date: 12/14/1956    Quit date: 12/15/1971    Years since quitting: 51.7   Smokeless tobacco: Never  Vaping Use   Vaping status: Never Used  Substance and Sexual Activity   Alcohol use: No    Alcohol/week: 0.0 standard drinks of alcohol   Drug use: No   Sexual activity: Not on file  Other Topics Concern   Not on file  Social History Narrative   Not on file   Social Determinants of Health   Financial Resource Strain: Not on file  Food Insecurity: Not on file  Transportation Needs: Not on file  Physical Activity: Not on file  Stress: Not on file  Social Connections: Not on file  Intimate Partner Violence: Not on file    Physical Exam: There were no vitals filed for this visit. There is no height or weight on file to calculate BMI. GEN: NAD EYE: Sclerae anicteric ENT: MMM CV: Non-tachycardic GI: Soft, NT/ND NEURO:  Alert & Oriented x  3  Lab Results: No results for input(s): "WBC", "HGB", "HCT", "PLT" in the last 72 hours. BMET No results for input(s): "NA", "K", "CL", "CO2", "GLUCOSE", "BUN", "CREATININE", "CALCIUM" in the last 72 hours. LFT No results for input(s):  "PROT", "ALBUMIN", "AST", "ALT", "ALKPHOS", "BILITOT", "BILIDIR", "IBILI" in the last 72 hours. PT/INR No results for input(s): "LABPROT", "INR" in the last 72 hours.   Impression / Plan: This is a 77 y.o.male who presents for   The risks and benefits of endoscopic evaluation/treatment were discussed with the patient and/or family; these include but are not limited to the risk of perforation, infection, bleeding, missed lesions, lack of diagnosis, severe illness requiring hospitalization, as well as anesthesia and sedation related illnesses.  The patient's history has been reviewed, patient examined, no change in status, and deemed stable for procedure.  The patient and/or family is agreeable to proceed.    Corliss Parish, MD [facility] Office # [phone]

## 2023-09-08 ENCOUNTER — Telehealth: Payer: Self-pay

## 2023-09-08 NOTE — Telephone Encounter (Signed)
No answer, left message to call if having any issues or concerns, B.Delorean Knutzen RN

## 2023-09-09 LAB — SURGICAL PATHOLOGY

## 2023-09-10 ENCOUNTER — Encounter: Payer: Self-pay | Admitting: Gastroenterology

## 2023-10-07 DIAGNOSIS — C439 Malignant melanoma of skin, unspecified: Secondary | ICD-10-CM | POA: Diagnosis not present

## 2023-10-07 DIAGNOSIS — E1169 Type 2 diabetes mellitus with other specified complication: Secondary | ICD-10-CM | POA: Diagnosis not present

## 2023-10-07 DIAGNOSIS — I10 Essential (primary) hypertension: Secondary | ICD-10-CM | POA: Diagnosis not present

## 2023-10-07 DIAGNOSIS — E1122 Type 2 diabetes mellitus with diabetic chronic kidney disease: Secondary | ICD-10-CM | POA: Diagnosis not present

## 2023-10-07 DIAGNOSIS — Z23 Encounter for immunization: Secondary | ICD-10-CM | POA: Diagnosis not present

## 2023-10-07 DIAGNOSIS — D692 Other nonthrombocytopenic purpura: Secondary | ICD-10-CM | POA: Diagnosis not present

## 2023-10-07 DIAGNOSIS — N1832 Chronic kidney disease, stage 3b: Secondary | ICD-10-CM | POA: Diagnosis not present

## 2023-10-07 DIAGNOSIS — E039 Hypothyroidism, unspecified: Secondary | ICD-10-CM | POA: Diagnosis not present

## 2023-10-07 DIAGNOSIS — Z794 Long term (current) use of insulin: Secondary | ICD-10-CM | POA: Diagnosis not present

## 2023-10-07 DIAGNOSIS — I77810 Thoracic aortic ectasia: Secondary | ICD-10-CM | POA: Diagnosis not present

## 2024-01-20 DIAGNOSIS — L570 Actinic keratosis: Secondary | ICD-10-CM | POA: Diagnosis not present

## 2024-01-20 DIAGNOSIS — Z85820 Personal history of malignant melanoma of skin: Secondary | ICD-10-CM | POA: Diagnosis not present

## 2024-01-20 DIAGNOSIS — L821 Other seborrheic keratosis: Secondary | ICD-10-CM | POA: Diagnosis not present

## 2024-01-20 DIAGNOSIS — D2239 Melanocytic nevi of other parts of face: Secondary | ICD-10-CM | POA: Diagnosis not present

## 2024-01-20 DIAGNOSIS — L850 Acquired ichthyosis: Secondary | ICD-10-CM | POA: Diagnosis not present

## 2024-01-20 DIAGNOSIS — L814 Other melanin hyperpigmentation: Secondary | ICD-10-CM | POA: Diagnosis not present

## 2024-01-20 DIAGNOSIS — Z85828 Personal history of other malignant neoplasm of skin: Secondary | ICD-10-CM | POA: Diagnosis not present

## 2024-01-20 DIAGNOSIS — D225 Melanocytic nevi of trunk: Secondary | ICD-10-CM | POA: Diagnosis not present

## 2024-01-20 DIAGNOSIS — D2272 Melanocytic nevi of left lower limb, including hip: Secondary | ICD-10-CM | POA: Diagnosis not present

## 2024-02-07 DIAGNOSIS — R110 Nausea: Secondary | ICD-10-CM | POA: Diagnosis not present

## 2024-02-07 DIAGNOSIS — Z1152 Encounter for screening for COVID-19: Secondary | ICD-10-CM | POA: Diagnosis not present

## 2024-02-07 DIAGNOSIS — E1122 Type 2 diabetes mellitus with diabetic chronic kidney disease: Secondary | ICD-10-CM | POA: Diagnosis not present

## 2024-02-07 DIAGNOSIS — R5383 Other fatigue: Secondary | ICD-10-CM | POA: Diagnosis not present

## 2024-02-07 DIAGNOSIS — R0981 Nasal congestion: Secondary | ICD-10-CM | POA: Diagnosis not present

## 2024-02-07 DIAGNOSIS — R058 Other specified cough: Secondary | ICD-10-CM | POA: Diagnosis not present

## 2024-02-23 DIAGNOSIS — Z961 Presence of intraocular lens: Secondary | ICD-10-CM | POA: Diagnosis not present

## 2024-02-23 DIAGNOSIS — E119 Type 2 diabetes mellitus without complications: Secondary | ICD-10-CM | POA: Diagnosis not present

## 2024-02-23 DIAGNOSIS — D3132 Benign neoplasm of left choroid: Secondary | ICD-10-CM | POA: Diagnosis not present

## 2024-03-08 DIAGNOSIS — E785 Hyperlipidemia, unspecified: Secondary | ICD-10-CM | POA: Diagnosis not present

## 2024-03-08 DIAGNOSIS — E1169 Type 2 diabetes mellitus with other specified complication: Secondary | ICD-10-CM | POA: Diagnosis not present

## 2024-03-08 DIAGNOSIS — N1832 Chronic kidney disease, stage 3b: Secondary | ICD-10-CM | POA: Diagnosis not present

## 2024-03-08 DIAGNOSIS — Z794 Long term (current) use of insulin: Secondary | ICD-10-CM | POA: Diagnosis not present

## 2024-03-08 DIAGNOSIS — I10 Essential (primary) hypertension: Secondary | ICD-10-CM | POA: Diagnosis not present

## 2024-03-17 DIAGNOSIS — E538 Deficiency of other specified B group vitamins: Secondary | ICD-10-CM | POA: Diagnosis not present

## 2024-03-17 DIAGNOSIS — E7800 Pure hypercholesterolemia, unspecified: Secondary | ICD-10-CM | POA: Diagnosis not present

## 2024-03-17 DIAGNOSIS — N1832 Chronic kidney disease, stage 3b: Secondary | ICD-10-CM | POA: Diagnosis not present

## 2024-03-17 DIAGNOSIS — I129 Hypertensive chronic kidney disease with stage 1 through stage 4 chronic kidney disease, or unspecified chronic kidney disease: Secondary | ICD-10-CM | POA: Diagnosis not present

## 2024-03-17 DIAGNOSIS — E7849 Other hyperlipidemia: Secondary | ICD-10-CM | POA: Diagnosis not present

## 2024-03-17 DIAGNOSIS — E039 Hypothyroidism, unspecified: Secondary | ICD-10-CM | POA: Diagnosis not present

## 2024-03-17 DIAGNOSIS — E1122 Type 2 diabetes mellitus with diabetic chronic kidney disease: Secondary | ICD-10-CM | POA: Diagnosis not present

## 2024-03-17 DIAGNOSIS — E1165 Type 2 diabetes mellitus with hyperglycemia: Secondary | ICD-10-CM | POA: Diagnosis not present

## 2024-03-17 DIAGNOSIS — N401 Enlarged prostate with lower urinary tract symptoms: Secondary | ICD-10-CM | POA: Diagnosis not present

## 2024-03-17 DIAGNOSIS — E785 Hyperlipidemia, unspecified: Secondary | ICD-10-CM | POA: Diagnosis not present

## 2024-03-17 DIAGNOSIS — Z794 Long term (current) use of insulin: Secondary | ICD-10-CM | POA: Diagnosis not present

## 2024-03-21 DIAGNOSIS — Z1331 Encounter for screening for depression: Secondary | ICD-10-CM | POA: Diagnosis not present

## 2024-03-21 DIAGNOSIS — Z0000 Encounter for general adult medical examination without abnormal findings: Secondary | ICD-10-CM | POA: Diagnosis not present

## 2024-03-21 DIAGNOSIS — E039 Hypothyroidism, unspecified: Secondary | ICD-10-CM | POA: Diagnosis not present

## 2024-03-21 DIAGNOSIS — Z1339 Encounter for screening examination for other mental health and behavioral disorders: Secondary | ICD-10-CM | POA: Diagnosis not present

## 2024-03-21 DIAGNOSIS — E1169 Type 2 diabetes mellitus with other specified complication: Secondary | ICD-10-CM | POA: Diagnosis not present

## 2024-03-21 DIAGNOSIS — I10 Essential (primary) hypertension: Secondary | ICD-10-CM | POA: Diagnosis not present

## 2024-03-21 DIAGNOSIS — I77810 Thoracic aortic ectasia: Secondary | ICD-10-CM | POA: Diagnosis not present

## 2024-03-21 DIAGNOSIS — N1832 Chronic kidney disease, stage 3b: Secondary | ICD-10-CM | POA: Diagnosis not present

## 2024-03-21 DIAGNOSIS — H35351 Cystoid macular degeneration, right eye: Secondary | ICD-10-CM | POA: Diagnosis not present

## 2024-03-21 DIAGNOSIS — C439 Malignant melanoma of skin, unspecified: Secondary | ICD-10-CM | POA: Diagnosis not present

## 2024-03-21 DIAGNOSIS — R82998 Other abnormal findings in urine: Secondary | ICD-10-CM | POA: Diagnosis not present

## 2024-03-21 DIAGNOSIS — E1122 Type 2 diabetes mellitus with diabetic chronic kidney disease: Secondary | ICD-10-CM | POA: Diagnosis not present

## 2024-03-21 DIAGNOSIS — Z794 Long term (current) use of insulin: Secondary | ICD-10-CM | POA: Diagnosis not present

## 2024-03-21 DIAGNOSIS — E538 Deficiency of other specified B group vitamins: Secondary | ICD-10-CM | POA: Diagnosis not present

## 2024-07-13 DIAGNOSIS — E785 Hyperlipidemia, unspecified: Secondary | ICD-10-CM | POA: Diagnosis not present

## 2024-07-13 DIAGNOSIS — I10 Essential (primary) hypertension: Secondary | ICD-10-CM | POA: Diagnosis not present

## 2024-07-13 DIAGNOSIS — Z794 Long term (current) use of insulin: Secondary | ICD-10-CM | POA: Diagnosis not present

## 2024-07-13 DIAGNOSIS — E1169 Type 2 diabetes mellitus with other specified complication: Secondary | ICD-10-CM | POA: Diagnosis not present

## 2024-07-13 DIAGNOSIS — N1832 Chronic kidney disease, stage 3b: Secondary | ICD-10-CM | POA: Diagnosis not present

## 2024-07-27 DIAGNOSIS — L821 Other seborrheic keratosis: Secondary | ICD-10-CM | POA: Diagnosis not present

## 2024-07-27 DIAGNOSIS — D225 Melanocytic nevi of trunk: Secondary | ICD-10-CM | POA: Diagnosis not present

## 2024-07-27 DIAGNOSIS — D2261 Melanocytic nevi of right upper limb, including shoulder: Secondary | ICD-10-CM | POA: Diagnosis not present

## 2024-07-27 DIAGNOSIS — Z85828 Personal history of other malignant neoplasm of skin: Secondary | ICD-10-CM | POA: Diagnosis not present

## 2024-07-27 DIAGNOSIS — L814 Other melanin hyperpigmentation: Secondary | ICD-10-CM | POA: Diagnosis not present

## 2024-07-27 DIAGNOSIS — L570 Actinic keratosis: Secondary | ICD-10-CM | POA: Diagnosis not present

## 2024-07-27 DIAGNOSIS — Z85820 Personal history of malignant melanoma of skin: Secondary | ICD-10-CM | POA: Diagnosis not present

## 2024-08-11 DIAGNOSIS — E1169 Type 2 diabetes mellitus with other specified complication: Secondary | ICD-10-CM | POA: Diagnosis not present

## 2024-08-11 DIAGNOSIS — E785 Hyperlipidemia, unspecified: Secondary | ICD-10-CM | POA: Diagnosis not present

## 2024-08-11 DIAGNOSIS — N1832 Chronic kidney disease, stage 3b: Secondary | ICD-10-CM | POA: Diagnosis not present

## 2024-08-11 DIAGNOSIS — E538 Deficiency of other specified B group vitamins: Secondary | ICD-10-CM | POA: Diagnosis not present

## 2024-08-11 DIAGNOSIS — N401 Enlarged prostate with lower urinary tract symptoms: Secondary | ICD-10-CM | POA: Diagnosis not present

## 2024-08-11 DIAGNOSIS — R351 Nocturia: Secondary | ICD-10-CM | POA: Diagnosis not present

## 2024-08-11 DIAGNOSIS — I77810 Thoracic aortic ectasia: Secondary | ICD-10-CM | POA: Diagnosis not present

## 2024-08-11 DIAGNOSIS — N390 Urinary tract infection, site not specified: Secondary | ICD-10-CM | POA: Diagnosis not present

## 2024-08-11 DIAGNOSIS — M25561 Pain in right knee: Secondary | ICD-10-CM | POA: Diagnosis not present

## 2024-08-11 DIAGNOSIS — I10 Essential (primary) hypertension: Secondary | ICD-10-CM | POA: Diagnosis not present

## 2024-09-20 DIAGNOSIS — I10 Essential (primary) hypertension: Secondary | ICD-10-CM | POA: Diagnosis not present

## 2024-09-20 DIAGNOSIS — M25561 Pain in right knee: Secondary | ICD-10-CM | POA: Diagnosis not present

## 2024-09-20 DIAGNOSIS — N1832 Chronic kidney disease, stage 3b: Secondary | ICD-10-CM | POA: Diagnosis not present

## 2024-09-20 DIAGNOSIS — E785 Hyperlipidemia, unspecified: Secondary | ICD-10-CM | POA: Diagnosis not present

## 2024-09-20 DIAGNOSIS — R351 Nocturia: Secondary | ICD-10-CM | POA: Diagnosis not present

## 2024-09-20 DIAGNOSIS — Z23 Encounter for immunization: Secondary | ICD-10-CM | POA: Diagnosis not present

## 2024-09-20 DIAGNOSIS — I739 Peripheral vascular disease, unspecified: Secondary | ICD-10-CM | POA: Diagnosis not present

## 2024-09-20 DIAGNOSIS — I77810 Thoracic aortic ectasia: Secondary | ICD-10-CM | POA: Diagnosis not present

## 2024-09-20 DIAGNOSIS — E039 Hypothyroidism, unspecified: Secondary | ICD-10-CM | POA: Diagnosis not present

## 2024-09-20 DIAGNOSIS — E1169 Type 2 diabetes mellitus with other specified complication: Secondary | ICD-10-CM | POA: Diagnosis not present

## 2024-09-26 DIAGNOSIS — M25361 Other instability, right knee: Secondary | ICD-10-CM | POA: Diagnosis not present

## 2024-10-11 ENCOUNTER — Encounter (HOSPITAL_COMMUNITY): Payer: Self-pay | Admitting: Internal Medicine

## 2024-10-11 ENCOUNTER — Other Ambulatory Visit (HOSPITAL_COMMUNITY): Payer: Self-pay | Admitting: Internal Medicine

## 2024-10-11 DIAGNOSIS — R0989 Other specified symptoms and signs involving the circulatory and respiratory systems: Secondary | ICD-10-CM

## 2024-10-18 ENCOUNTER — Ambulatory Visit (HOSPITAL_COMMUNITY)
Admission: RE | Admit: 2024-10-18 | Discharge: 2024-10-18 | Disposition: A | Discharge status: Home / Self Care | Source: Ambulatory Visit | Attending: Vascular Surgery | Admitting: Vascular Surgery

## 2024-10-18 DIAGNOSIS — R0989 Other specified symptoms and signs involving the circulatory and respiratory systems: Secondary | ICD-10-CM | POA: Diagnosis not present

## 2024-10-18 LAB — VAS US ABI WITH/WO TBI
Left ABI: 1.18
Right ABI: 1.26

## 2024-11-16 DIAGNOSIS — H40013 Open angle with borderline findings, low risk, bilateral: Secondary | ICD-10-CM | POA: Diagnosis not present

## 2024-11-16 DIAGNOSIS — H2511 Age-related nuclear cataract, right eye: Secondary | ICD-10-CM | POA: Diagnosis not present

## 2024-11-16 DIAGNOSIS — H33191 Other retinoschisis and retinal cysts, right eye: Secondary | ICD-10-CM | POA: Diagnosis not present

## 2024-11-16 DIAGNOSIS — H35371 Puckering of macula, right eye: Secondary | ICD-10-CM | POA: Diagnosis not present

## 2024-11-16 DIAGNOSIS — H43811 Vitreous degeneration, right eye: Secondary | ICD-10-CM | POA: Diagnosis not present

## 2024-11-16 DIAGNOSIS — H35342 Macular cyst, hole, or pseudohole, left eye: Secondary | ICD-10-CM | POA: Diagnosis not present
